# Patient Record
Sex: Male | Born: 1955 | Race: White | Hispanic: No | Smoking: Former smoker
Health system: Southern US, Community
[De-identification: ages and names within clinical notes are randomized; demographics above are authoritative.]

## PROBLEM LIST (undated history)

## (undated) DIAGNOSIS — Z9989 Dependence on other enabling machines and devices: Secondary | ICD-10-CM

## (undated) DIAGNOSIS — M1990 Unspecified osteoarthritis, unspecified site: Secondary | ICD-10-CM

## (undated) DIAGNOSIS — E349 Endocrine disorder, unspecified: Secondary | ICD-10-CM

## (undated) DIAGNOSIS — G4733 Obstructive sleep apnea (adult) (pediatric): Secondary | ICD-10-CM

## (undated) DIAGNOSIS — R030 Elevated blood-pressure reading, without diagnosis of hypertension: Secondary | ICD-10-CM

## (undated) DIAGNOSIS — E785 Hyperlipidemia, unspecified: Secondary | ICD-10-CM

## (undated) DIAGNOSIS — I10 Essential (primary) hypertension: Secondary | ICD-10-CM

## (undated) DIAGNOSIS — E119 Type 2 diabetes mellitus without complications: Secondary | ICD-10-CM

## (undated) HISTORY — DX: Hyperlipidemia, unspecified: E78.5

## (undated) HISTORY — DX: Unspecified osteoarthritis, unspecified site: M19.90

## (undated) HISTORY — DX: Endocrine disorder, unspecified: E34.9

## (undated) HISTORY — DX: Obstructive sleep apnea (adult) (pediatric): G47.33

## (undated) HISTORY — DX: Dependence on other enabling machines and devices: Z99.89

## (undated) HISTORY — PX: FOOT SURGERY: SHX648

## (undated) HISTORY — DX: Type 2 diabetes mellitus without complications: E11.9

## (undated) HISTORY — DX: Elevated blood-pressure reading, without diagnosis of hypertension: R03.0

## (undated) HISTORY — DX: Essential (primary) hypertension: I10

## (undated) HISTORY — DX: Morbid (severe) obesity due to excess calories: E66.01

---

## 2006-12-02 ENCOUNTER — Ambulatory Visit: Payer: Self-pay | Admitting: Family Medicine

## 2007-07-28 ENCOUNTER — Encounter: Admission: RE | Admit: 2007-07-28 | Discharge: 2007-07-28 | Payer: Self-pay | Admitting: Internal Medicine

## 2009-04-08 ENCOUNTER — Ambulatory Visit: Payer: Self-pay

## 2010-10-15 ENCOUNTER — Telehealth: Payer: Self-pay | Admitting: Internal Medicine

## 2010-10-15 NOTE — Telephone Encounter (Signed)
I have faxed him a medical records release form for him to sign and fax back to [place] so we can fax it to [facility].

## 2010-11-25 ENCOUNTER — Telehealth: Payer: Self-pay | Admitting: Internal Medicine

## 2010-11-25 NOTE — Telephone Encounter (Signed)
[phone] Pt called he needs letter stating that he has sleep apena and diabetes high blood pressure for his insurance company.  Pt also stated he filled out medical release form for [facility].   i also called [facility] to see if they can could send [place] his records i spoke with lisa she is going to fax over the problem list to [place].  They didn't have any medical release form.  Patient will come by to fill out form

## 2010-11-26 NOTE — Telephone Encounter (Signed)
Patient came in to fill out medical release form and this was faxed today @ 2:30

## 2010-11-29 NOTE — Telephone Encounter (Signed)
Letter on printer

## 2010-11-29 NOTE — Telephone Encounter (Signed)
Called patient.  Patient aware he will come by to pick up letter

## 2011-11-08 ENCOUNTER — Encounter: Payer: Self-pay | Admitting: Internal Medicine

## 2011-11-08 ENCOUNTER — Ambulatory Visit (INDEPENDENT_AMBULATORY_CARE_PROVIDER_SITE_OTHER): Payer: Self-pay | Admitting: Internal Medicine

## 2011-11-08 VITALS — BP 160/90 | HR 90 | Temp 98.3°F | Ht 74.0 in | Wt 318.0 lb

## 2011-11-08 DIAGNOSIS — E785 Hyperlipidemia, unspecified: Secondary | ICD-10-CM

## 2011-11-08 DIAGNOSIS — E119 Type 2 diabetes mellitus without complications: Secondary | ICD-10-CM

## 2011-11-08 DIAGNOSIS — I10 Essential (primary) hypertension: Secondary | ICD-10-CM | POA: Insufficient documentation
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
## 2011-11-08 NOTE — Patient Instructions (Addendum)
I am starting you on bystolic 5 mg daily for blood pressure  Hold on to the insulin fr now pending lab results    This is  Dr. Norton Blizzard version of a  "Low GI"  Diet:  All of the foods can be found at grocery stores and in bulk at [employer].  The Atkins protein bars and shakes are available in more varieties at Target, WalMart and [employer] Foods.     7 AM Breakfast:  Low carbohydrate Protein  Shakes (I recommend the EAS AdvantEdge "Carb Control" shakes  Or the low carb shakes by Atkins.   Both are available everywhere:  In  cases at BJs  Or in 4 packs at grocery stores and pharmacies  2.5 carbs  (Alternative is  a toasted Arnold's Sandwhich Thin w/ peanut butter, a "Bagel Thin" with cream cheese and salmon) or  a scrambled egg burrito made with a low carb tortilla .  Avoid cereal and bananas, oatmeal too unless you are cooking the old fashioned kind that takes 30-40 minutes to prepare.  the rest is overly processed, has minimal fiber, and is loaded with carbohydrates!   10 AM: Protein bar by Atkins (the snack size, under 200 cal).  There are many varieties , available widely again or in bulk in limited varieties at BJs)  Other so called "protein bars" tend to be loaded with carbohydrates.  Remember, in food advertising, the word "energy" is synonymous for " carbohydrate."  Lunch: sandwich of [place], (or any lunchmeat, grilled meat or canned tuna), fresh avocado, mayonnaise  and cheese on a lower carbohydrate pita bread, flatbread, or tortilla . Ok to use regular mayonnaise. The bread is the only source or carbohydrate that can be decreased (Joseph's makes a pita bread and a flat bread that are 50 cal and 4 net carbs ; Toufayan makes a low carb flatbread that's 100 cal and 9 net carbs  and  Mission makes a low carb whole wheat tortilla  That is 210 cal and 6 net carbs)  3 PM:  Mid day :  Another protein bar,  Or a  cheese stick (100 cal, 0 carbs),  Or 1 ounce of  almonds, walnuts, pistachios, pecans,  peanuts,  Macadamia nuts. Or a Dannon light n Fit greek yogurt, 80 cal 8 net carbs . Avoid "granola"; the dried cranberries and raisins are loaded with carbohydrates. Mixed nuts ok if no raisins or cranberries or dried fruit.      6 PM  Dinner:  "mean and green:"  Meat/chicken/fish or a high protein legume; , with a green salad, and a low GI  Veggie (broccoli, cauliflower, green beans, spinach, brussel sprouts. Lima beans) : Avoid "Low fat dressings, as well as Reyne Dumas and [address]! They are loaded with sugar! Instead use ranch, vinagrette,  Blue cheese, etc  9 PM snack : Breyer's "low carb" fudgsicle or  ice cream bar (Carb Smart line), or  Weight Watcher's ice cream bar , or another "no sugar added" ice cream;a serving of fresh berries/cherries with whipped cream (Avoid bananas, pineapple, grapes  and watermelon on a regular basis because they are high in sugar)   Remember that snack Substitutions should be less than 15 to 20 carbs  Per serving. Remember to subtract fiber grams and sugar alcohols to get the "net carbs."

## 2011-11-09 ENCOUNTER — Encounter: Payer: Self-pay | Admitting: Internal Medicine

## 2011-11-09 DIAGNOSIS — E785 Hyperlipidemia, unspecified: Secondary | ICD-10-CM | POA: Insufficient documentation

## 2011-11-09 DIAGNOSIS — Z9989 Dependence on other enabling machines and devices: Secondary | ICD-10-CM | POA: Insufficient documentation

## 2011-11-09 DIAGNOSIS — E1140 Type 2 diabetes mellitus with diabetic neuropathy, unspecified: Secondary | ICD-10-CM | POA: Insufficient documentation

## 2011-11-09 DIAGNOSIS — G4733 Obstructive sleep apnea (adult) (pediatric): Secondary | ICD-10-CM | POA: Insufficient documentation

## 2011-11-09 DIAGNOSIS — E349 Endocrine disorder, unspecified: Secondary | ICD-10-CM | POA: Insufficient documentation

## 2011-11-09 MED ORDER — NEBIVOLOL HCL 5 MG PO TABS: 5.0000 mg | ORAL_TABLET | Freq: Every day | ORAL | Status: DC

## 2011-11-09 NOTE — Assessment & Plan Note (Signed)
Lipid status unknown. Will not likely treat with a statin until his diabetes is under better control. He has been on a statin in the past with good tolerance.

## 2011-11-09 NOTE — Assessment & Plan Note (Signed)
Previously managed with glipizide and metformin. He has not had medications one year. He will A1c and see that order. This patient was given samples of 70/30 insulin and Levaquin insulin in anticipation that he will need insulin initially to his blood sugars under control before resuming oral medications.  He has a diabetic eye exam set up for this Thursday. He will return in 3 months.

## 2011-11-09 NOTE — Assessment & Plan Note (Signed)
I have addressed  BMI and recommended a low glycemic index diet utilizing smaller more frequent meals to increase metabolism.  I have also recommended that patient start exercising with a goal of 30 minutes of aerobic exercise a minimum of 5 days per week. Screening for lipid disorders, thyroid and diabetes to be done today.   

## 2011-11-09 NOTE — Assessment & Plan Note (Signed)
Currently under control due to lack of medications. Renal status is unknown. Samples of Bystolic were given to him today 5 mg daily.

## 2011-11-10 ENCOUNTER — Other Ambulatory Visit: Payer: Self-pay | Admitting: Internal Medicine

## 2011-11-10 DIAGNOSIS — E119 Type 2 diabetes mellitus without complications: Secondary | ICD-10-CM

## 2011-11-10 DIAGNOSIS — E785 Hyperlipidemia, unspecified: Secondary | ICD-10-CM

## 2011-11-10 LAB — HM DIABETES EYE EXAM

## 2011-11-15 LAB — LIPID PANEL W/O CHOL/HDL RATIO
Cholesterol, Total: 225 mg/dL — ABNORMAL HIGH (ref 100–199)
HDL: 46 mg/dL (ref >39)
LDL Calculated: 130 mg/dL — ABNORMAL HIGH (ref 0–99)
Triglycerides: 244 mg/dL — ABNORMAL HIGH (ref 0–149)
VLDL Cholesterol Cal: 49 mg/dL — ABNORMAL HIGH (ref 5–40)

## 2011-11-15 LAB — COMPREHENSIVE METABOLIC PANEL
Albumin: 4.6 g/dL (ref 3.5–5.5)
BUN/Creatinine Ratio: 15 (ref 9–20)
BUN: 14 mg/dL (ref 6–24)
CO2: 22 mmol/L (ref 19–28)
Chloride: 101 mmol/L (ref 97–108)
Creatinine, Ser: 0.95 mg/dL (ref 0.76–1.27)
Globulin, Total: 2.4 g/dL (ref 1.5–4.5)
Glucose: 233 mg/dL — ABNORMAL HIGH (ref 65–99)
Total Protein: 7 g/dL (ref 6.0–8.5)

## 2011-11-15 LAB — HGB A1C W/O EAG: Hgb A1c MFr Bld: 7.4 % — ABNORMAL HIGH (ref 4.8–5.6)

## 2011-11-20 MED ORDER — PRAVASTATIN SODIUM 20 MG PO TABS: 20.0000 mg | ORAL_TABLET | Freq: Every day | ORAL | Status: DC

## 2011-11-20 MED ORDER — METFORMIN HCL 500 MG PO TABS: 500.0000 mg | ORAL_TABLET | Freq: Two times a day (BID) | ORAL | Status: DC

## 2011-11-20 NOTE — Addendum Note (Signed)
Addended by: Sherlene Shams on: 11/20/2011 09:19 PM   Modules accepted: Orders

## 2011-11-21 ENCOUNTER — Other Ambulatory Visit: Payer: Self-pay

## 2012-02-10 ENCOUNTER — Ambulatory Visit (INDEPENDENT_AMBULATORY_CARE_PROVIDER_SITE_OTHER): Payer: Self-pay | Admitting: Internal Medicine

## 2012-02-10 ENCOUNTER — Encounter: Payer: Self-pay | Admitting: Internal Medicine

## 2012-02-10 VITALS — BP 128/78 | HR 89 | Temp 98.0°F | Resp 16 | Wt 313.0 lb

## 2012-02-10 DIAGNOSIS — Z8673 Personal history of transient ischemic attack (TIA), and cerebral infarction without residual deficits: Secondary | ICD-10-CM

## 2012-02-10 DIAGNOSIS — E119 Type 2 diabetes mellitus without complications: Secondary | ICD-10-CM

## 2012-02-10 DIAGNOSIS — E349 Endocrine disorder, unspecified: Secondary | ICD-10-CM

## 2012-02-10 DIAGNOSIS — IMO0002 Reserved for concepts with insufficient information to code with codable children: Secondary | ICD-10-CM

## 2012-02-10 DIAGNOSIS — G4733 Obstructive sleep apnea (adult) (pediatric): Secondary | ICD-10-CM

## 2012-02-10 DIAGNOSIS — Z9989 Dependence on other enabling machines and devices: Secondary | ICD-10-CM

## 2012-02-10 DIAGNOSIS — E291 Testicular hypofunction: Secondary | ICD-10-CM

## 2012-02-10 DIAGNOSIS — I10 Essential (primary) hypertension: Secondary | ICD-10-CM

## 2012-02-10 DIAGNOSIS — E785 Hyperlipidemia, unspecified: Secondary | ICD-10-CM

## 2012-02-10 DIAGNOSIS — M1710 Unilateral primary osteoarthritis, unspecified knee: Secondary | ICD-10-CM

## 2012-02-10 LAB — LDL CHOLESTEROL, DIRECT: Direct LDL: 100.1 mg/dL

## 2012-02-10 LAB — COMPREHENSIVE METABOLIC PANEL
AST: 31 U/L (ref 0–37)
Albumin: 4.3 g/dL (ref 3.5–5.2)
BUN: 15 mg/dL (ref 6–23)
CO2: 26 mEq/L (ref 19–32)
Calcium: 9.4 mg/dL (ref 8.4–10.5)
Chloride: 100 mEq/L (ref 96–112)
Creatinine, Ser: 0.8 mg/dL (ref 0.4–1.5)
GFR: 102.57 mL/min (ref >60.00)
Glucose, Bld: 231 mg/dL — ABNORMAL HIGH (ref 70–99)
Potassium: 3.7 mEq/L (ref 3.5–5.1)

## 2012-02-10 LAB — LIPID PANEL
Cholesterol: 197 mg/dL (ref 0–200)
Triglycerides: 377 mg/dL — ABNORMAL HIGH (ref 0.0–149.0)

## 2012-02-10 LAB — HEMOGLOBIN A1C: Hgb A1c MFr Bld: 7.5 % — ABNORMAL HIGH (ref 4.6–6.5)

## 2012-02-10 MED ORDER — TRAMADOL HCL 50 MG PO TABS: 50.0000 mg | ORAL_TABLET | Freq: Four times a day (QID) | ORAL | Status: DC | PRN

## 2012-02-10 MED ORDER — MELOXICAM 15 MG PO TABS: 15.0000 mg | ORAL_TABLET | Freq: Every day | ORAL | Status: DC

## 2012-02-10 NOTE — Progress Notes (Signed)
Patient ID: David Pineda, male   DOB: 03/02/1955, 57 y.o.   MRN: [id number]   Patient Active Problem List  Diagnosis  . Hypertension  . OSA on CPAP  . Obesity, morbid, BMI 40.0-49.9  . Diabetes mellitus without complication  . Hyperlipidemia  . Hypotestosteronism  . History of CVA (cerebrovascular accident)  . DJD (degenerative joint disease) of knee    Subjective:  CC:   Chief Complaint  Patient presents with  . Follow-up    HPI:   David Pineda a 57 y.o. male who presents follow up on diabtes mellitus, hypertension, hyperlipidemia, obesity and OSA.   Has had difficulty adhering to a low GI diet due to financial pressures and work schedule. Does not check his blood sugars regularly.  NO recent episodes of hypoglycemia.  Taking meds most of the time.  Having a lot of knee pain . Aggravated by walking and standing.      Past Medical History  Diagnosis Date  . Arthritis   . Elevated blood pressure reading   . OSA on CPAP   . Obesity, morbid, BMI 40.0-49.9   . Diabetes mellitus without complication   . Hypertension   . Hyperlipidemia   . Hypotestosteronism     History reviewed. No pertinent past surgical history.       The following portions of the patient's history were reviewed and updated as appropriate: Allergies, current medications, and problem list.    Review of Systems:   12 Pt  review of systems was negative except those addressed in the HPI,     History   Social History  . Marital Status: Divorced    Spouse Name: N/A    Number of Children: N/A  . Years of Education: N/A   Occupational History  . Not on file.   Social History Main Topics  . Smoking status: Former Games developer  . Smokeless tobacco: Not on file  . Alcohol Use: Yes  . Drug Use: Not on file  . Sexually Active: Not on file   Other Topics Concern  . Not on file   Social History Narrative  . No narrative on file    Objective:  BP 128/78  Pulse 89  Temp 98 F (36.7 C)  (Oral)  Resp 16  Wt 313 lb (141.976 kg)  SpO2 96%  General appearance: alert, cooperative and appears stated age Ears: normal TM's and external ear canals both ears Throat: lips, mucosa, and tongue normal; teeth and gums normal Neck: no adenopathy, no carotid bruit, supple, symmetrical, trachea midline and thyroid not enlarged, symmetric, no tenderness/mass/nodules Back: symmetric, no curvature. ROM normal. No CVA tenderness. Lungs: clear to auscultation bilaterally Heart: regular rate and rhythm, S1, S2 normal, no murmur, click, rub or gallop Abdomen: soft, non-tender; bowel sounds normal; no masses,  no organomegaly Pulses: 2+ and symmetric Skin: Skin color, texture, turgor normal. No rashes or lesions Lymph nodes: Cervical, supraclavicular, and axillary nodes normal.  Assessment and Plan:  Diabetes mellitus without complication a1c is 7.5 on metformin alone.  Will add low dose glipizide reminder for diabetic eye exam and daily aspirin given.  Foot exam normal today.   History of CVA (cerebrovascular accident) An   Hyperlipidemia LDL is 130 and trigs are over 377 on pravstatin. l will raise dose to 40 mg and recommend a low GI diet recommended for 3 month trial given the expense of fenofibrate.   Hypertension Well controlled on current regimen. Renal function stable, no changes today.  OSA on  CPAP diagnosed with prior sleep study, patient tolerating CPAP per report.Marland Kitchen  DJD (degenerative joint disease) of knee Recommend weight loss, quad strengthening exercises and aleve/tramadol for pain control   Updated Medication List Outpatient Encounter Prescriptions as of 02/10/2012  Medication Sig Dispense Refill  . Ascorbic Acid (VITAMIN C) 100 MG tablet Take 100 mg by mouth daily.      Marland Kitchen aspirin 81 MG tablet Take 81 mg by mouth daily.      . fish oil-omega-3 fatty acids 1000 MG capsule Take 2 g by mouth daily.      . metFORMIN (GLUCOPHAGE) 500 MG tablet Take 1 tablet (500 mg  total) by mouth 2 (two) times daily with a meal.  180 tablet  3  . Multiple Vitamins-Minerals (MULTIVITAMIN WITH MINERALS) tablet Take 1 tablet by mouth daily.      . nebivolol (BYSTOLIC) 5 MG tablet Take 1 tablet (5 mg total) by mouth daily.  30 tablet  0  . pravastatin (PRAVACHOL) 40 MG tablet Take 1 tablet (40 mg total) by mouth daily.  90 tablet  3  . [DISCONTINUED] pravastatin (PRAVACHOL) 20 MG tablet Take 1 tablet (20 mg total) by mouth daily.  90 tablet  3  . glipiZIDE (GLUCOTROL) 2.5 mg TABS Take 0.5 tablets (2.5 mg total) by mouth 2 (two) times daily before a meal.  60 tablet  3  . traMADol (ULTRAM) 50 MG tablet Take 1 tablet (50 mg total) by mouth every 6 (six) hours as needed for pain.  120 tablet  4  . [DISCONTINUED] meloxicam (MOBIC) 15 MG tablet Take 1 tablet (15 mg total) by mouth daily.  30 tablet  4     Orders Placed This Encounter  Procedures  . Microalbumin / creatinine urine ratio  . Comprehensive metabolic panel  . Lipid panel  . Hemoglobin A1c  . LDL cholesterol, direct  . HM DIABETES EYE EXAM  . HM DIABETES FOOT EXAM  . HM DIABETES EYE EXAM    No Follow-up on file.

## 2012-02-10 NOTE — Patient Instructions (Addendum)
The tramadol is a pure pain reliever and can be combined with tylenol and aleve  Do not combine aleve with motrin or meloxicam   You can take 2 tramadol every 6 hours if needed (max dose)   You need to lose 10%  Of your current body weight over the next 6 months   This is  my version of a  "Low GI"  Diet:  It is not ultra low carb, but will still lower your blood sugars and allow you to lose 5 to 10 lbs per month if you follow it carefully. All of the foods can be found at grocery stores and in bulk at [employer].  The Atkins protein bars and shakes are available in more varieties at Target, WalMart and [employer] Foods.     7 AM Breakfast:  Low carbohydrate Protein  Shakes (I recommend the EAS AdvantEdge "Carb Control" shakes  Or the low carb shakes by Atkins.   Both are available everywhere:  In  cases at BJs  Or in 4 packs at grocery stores and pharmacies  2.5 carbs  (Alternative is  a toasted Arnold's Sandwhich Thin w/ peanut butter, a "Bagel Thin" with cream cheese and salmon) or  a scrambled egg burrito made with a low carb tortilla .  Avoid cereal and bananas, oatmeal too unless you are cooking the old fashioned kind that takes 30-40 minutes to prepare.  the rest is overly processed, has minimal fiber, and is loaded with carbohydrates!   10 AM: Protein bar by Atkins (the snack size, under 200 cal).  There are many varieties , available widely again or in bulk in limited varieties at BJs)  Other so called "protein bars" tend to be loaded with carbohydrates.  Remember, in food advertising, the word "energy" is synonymous for " carbohydrate."  Lunch: sandwich of [place], (or any lunchmeat, grilled meat or canned tuna), fresh avocado, mayonnaise  and cheese on a lower carbohydrate pita bread, flatbread, or tortilla . Ok to use regular mayonnaise. The bread is the only source or carbohydrate that can be decreased (Joseph's makes a pita bread and a flat bread that are 50 cal and 4 net carbs ; Toufayan  makes a low carb flatbread that's 100 cal and 9 net carbs  and  Mission makes a low carb whole wheat tortilla  That is 210 cal and 6 net carbs)  3 PM:  Mid day :  Another protein bar,  Or a  cheese stick (100 cal, 0 carbs),  Or 1 ounce of  almonds, walnuts, pistachios, pecans, peanuts,  Macadamia nuts. Or a Dannon light n Fit greek yogurt, 80 cal 8 net carbs . Avoid "granola"; the dried cranberries and raisins are loaded with carbohydrates. Mixed nuts ok if no raisins or cranberries or dried fruit.      6 PM  Dinner:  "mean and green:"  Meat/chicken/fish or a high protein legume; , with a green salad, and a low GI  Veggie (broccoli, cauliflower, green beans, spinach, brussel sprouts. Lima beans) : Avoid "Low fat dressings, as well as Reyne Dumas and [address]! They are loaded with sugar! Instead use ranch, vinagrette,  Blue cheese, etc.  There is a low carb pasta by Dreamfield's available at [facility] that is acceptable and tastes great. Try Michel Angel's chicken piccata over low carb pasta. The chicken dish is 0 carbs, and can be found in frozen section at BJs and [employer]. Also try [employer] "Carnitas" (pulled pork, no  sauce,  0 carbs) and his pot roast.   both are in the refrigerated section at BJs   Dreamfield's makes a low carb pasta only 5 g/serving.  Available at all grocery stores,  And tastes like normal pasta  9 PM snack : Breyer's "low carb" fudgsicle or  ice cream bar (Carb Smart line), or  Weight Watcher's ice cream bar , or another "no sugar added" ice cream;a serving of fresh berries/cherries with whipped cream (Avoid bananas, pineapple, grapes  and watermelon on a regular basis because they are high in sugar)   Remember that snack Substitutions should be less than 10 carbs per serving and meals < 20 carbs. Remember to subtract fiber grams and sugar alcohols to get the "net carbs."

## 2012-02-12 ENCOUNTER — Encounter: Payer: Self-pay | Admitting: Internal Medicine

## 2012-02-12 DIAGNOSIS — Z8673 Personal history of transient ischemic attack (TIA), and cerebral infarction without residual deficits: Secondary | ICD-10-CM | POA: Insufficient documentation

## 2012-02-12 DIAGNOSIS — M1710 Unilateral primary osteoarthritis, unspecified knee: Secondary | ICD-10-CM | POA: Insufficient documentation

## 2012-02-12 MED ORDER — PRAVASTATIN SODIUM 40 MG PO TABS: 40.0000 mg | ORAL_TABLET | Freq: Every day | ORAL | Status: DC

## 2012-02-12 MED ORDER — GLIPIZIDE 2.5 MG HALF TABLET: 2.5000 mg | ORAL_TABLET | Freq: Two times a day (BID) | ORAL | Status: DC

## 2012-02-12 NOTE — Assessment & Plan Note (Signed)
Well controlled on current regimen. Renal function stable, no changes today. 

## 2012-02-12 NOTE — Assessment & Plan Note (Signed)
Recommend weight loss, quad strengthening exercises and aleve/tramadol for pain control

## 2012-02-12 NOTE — Assessment & Plan Note (Addendum)
a1c is 7.5 on metformin alone.  Will add low dose glipizide reminder for diabetic eye exam and daily aspirin given.  Foot exam normal today.

## 2012-02-12 NOTE — Assessment & Plan Note (Signed)
diagnosed with prior sleep study, patient tolerating CPAP per report.Marland Kitchen

## 2012-02-12 NOTE — Assessment & Plan Note (Addendum)
LDL is 130 and trigs are over 377 on pravstatin. l will raise dose to 40 mg and recommend a low GI diet recommended for 3 month trial given the expense of fenofibrate.

## 2012-02-12 NOTE — Assessment & Plan Note (Signed)
An

## 2012-02-13 ENCOUNTER — Encounter: Payer: Self-pay | Admitting: Internal Medicine

## 2012-02-14 ENCOUNTER — Telehealth: Payer: Self-pay | Admitting: Internal Medicine

## 2012-02-25 ENCOUNTER — Other Ambulatory Visit: Payer: Self-pay

## 2012-04-19 ENCOUNTER — Encounter: Payer: Self-pay | Admitting: Internal Medicine

## 2012-04-19 DIAGNOSIS — L989 Disorder of the skin and subcutaneous tissue, unspecified: Secondary | ICD-10-CM

## 2012-05-07 ENCOUNTER — Telehealth: Payer: Self-pay | Admitting: *Deleted

## 2012-05-07 NOTE — Telephone Encounter (Signed)
Called patient regarding the e-chart message alert on message sent out on 04-19-12. Patient stated that this has been taken care of and he already has an appointment scheduled with a dermatologist.

## 2012-08-14 ENCOUNTER — Other Ambulatory Visit: Payer: Self-pay | Admitting: *Deleted

## 2012-08-14 DIAGNOSIS — E119 Type 2 diabetes mellitus without complications: Secondary | ICD-10-CM

## 2012-08-14 MED ORDER — GLIPIZIDE 5 MG PO TABS: 2.5000 mg | ORAL_TABLET | Freq: Two times a day (BID) | ORAL | Status: DC

## 2012-09-18 ENCOUNTER — Other Ambulatory Visit: Payer: Self-pay | Admitting: Internal Medicine

## 2012-09-18 NOTE — Telephone Encounter (Signed)
Eprescribed.

## 2012-09-24 ENCOUNTER — Encounter: Payer: Self-pay | Admitting: Internal Medicine

## 2012-10-11 ENCOUNTER — Ambulatory Visit: Payer: Self-pay

## 2012-10-17 ENCOUNTER — Ambulatory Visit (INDEPENDENT_AMBULATORY_CARE_PROVIDER_SITE_OTHER): Payer: BC Managed Care – PPO | Admitting: Adult Health

## 2012-10-17 ENCOUNTER — Ambulatory Visit (INDEPENDENT_AMBULATORY_CARE_PROVIDER_SITE_OTHER): Payer: BC Managed Care – PPO | Admitting: *Deleted

## 2012-10-17 ENCOUNTER — Encounter: Payer: Self-pay | Admitting: Adult Health

## 2012-10-17 VITALS — BP 148/82 | HR 75 | Resp 12 | Wt 308.0 lb

## 2012-10-17 DIAGNOSIS — IMO0002 Reserved for concepts with insufficient information to code with codable children: Secondary | ICD-10-CM

## 2012-10-17 DIAGNOSIS — Z23 Encounter for immunization: Secondary | ICD-10-CM

## 2012-10-17 DIAGNOSIS — M1710 Unilateral primary osteoarthritis, unspecified knee: Secondary | ICD-10-CM

## 2012-10-17 MED ORDER — TRAMADOL HCL 50 MG PO TABS: 50.0000 mg | ORAL_TABLET | Freq: Four times a day (QID) | ORAL | Status: DC | PRN

## 2012-10-17 NOTE — Progress Notes (Signed)
  Subjective:    Patient ID: David Pineda, male    DOB: 12/03/1955, 57 y.o.   MRN: [id number]  HPI  Last seen in January for ongoing problems with knee pain 2/2 DJD. Started on tramadol with good results. Patient is here for refills on history at all. He reports that he only takes this medication when his knees are really bothering him. This does not occur on a daily basis. He reports that he understands he needs to have a knee replacement; however, financially he is not able to do this at this time. Symptoms are aggravated by standing on concrete floor at work.   Current Outpatient Prescriptions on File Prior to Visit  Medication Sig Dispense Refill  . Ascorbic Acid (VITAMIN C) 100 MG tablet Take 100 mg by mouth daily.      Marland Kitchen aspirin 81 MG tablet Take 81 mg by mouth daily.      . fish oil-omega-3 fatty acids 1000 MG capsule Take 2 g by mouth daily.      . metFORMIN (GLUCOPHAGE) 500 MG tablet Take 1 tablet (500 mg total) by mouth 2 (two) times daily with a meal.  180 tablet  3  . Multiple Vitamins-Minerals (MULTIVITAMIN WITH MINERALS) tablet Take 1 tablet by mouth daily.      . nebivolol (BYSTOLIC) 5 MG tablet Take 1 tablet (5 mg total) by mouth daily.  30 tablet  0  . pravastatin (PRAVACHOL) 40 MG tablet Take 1 tablet (40 mg total) by mouth daily.  90 tablet  3   No current facility-administered medications on file prior to visit.                                                                                    Review of Systems  Musculoskeletal: Negative for joint swelling.       Bilateral knee pain       Objective:   Physical Exam  Constitutional: He is oriented to person, place, and time.  Musculoskeletal: Normal range of motion. He exhibits no edema.  Bilateral knee pain 2/2 degenerative joints.  Neurological: He is alert and oriented to person, place, and time.  Skin: Skin is warm and dry.  Psychiatric: He has a normal mood and affect. His behavior is normal. Judgment and  thought content normal.          Assessment & Plan:

## 2012-10-17 NOTE — Progress Notes (Signed)
Patient came in thought he had appointment to see MD was explained he had made appointment through [facility] CHART for nurse visit for TDAP , patient c/o bilateral knee pain advised Dr. Darrick Huntsman had no appointment available so scheduled patient to see NP at 2.30 10/17/12

## 2012-10-17 NOTE — Assessment & Plan Note (Signed)
Continue Aleve/Tramadol for pain in knees as previously prescribed by PCP. Prescription for tramadol given with 4 refills.

## 2012-10-26 ENCOUNTER — Other Ambulatory Visit: Payer: Self-pay | Admitting: Internal Medicine

## 2012-10-26 NOTE — Telephone Encounter (Signed)
appt 11/01/12

## 2012-10-31 ENCOUNTER — Encounter: Payer: Self-pay | Admitting: *Deleted

## 2012-11-01 ENCOUNTER — Encounter: Payer: Self-pay | Admitting: Internal Medicine

## 2012-11-01 ENCOUNTER — Ambulatory Visit (INDEPENDENT_AMBULATORY_CARE_PROVIDER_SITE_OTHER): Payer: BC Managed Care – PPO | Admitting: Internal Medicine

## 2012-11-01 VITALS — BP 130/70 | HR 74 | Temp 98.3°F | Resp 14 | Ht 74.0 in | Wt 307.0 lb

## 2012-11-01 DIAGNOSIS — Z0000 Encounter for general adult medical examination without abnormal findings: Secondary | ICD-10-CM

## 2012-11-01 DIAGNOSIS — E119 Type 2 diabetes mellitus without complications: Secondary | ICD-10-CM

## 2012-11-01 DIAGNOSIS — E785 Hyperlipidemia, unspecified: Secondary | ICD-10-CM

## 2012-11-01 DIAGNOSIS — R079 Chest pain, unspecified: Secondary | ICD-10-CM | POA: Insufficient documentation

## 2012-11-01 DIAGNOSIS — Z1211 Encounter for screening for malignant neoplasm of colon: Secondary | ICD-10-CM

## 2012-11-01 DIAGNOSIS — Z23 Encounter for immunization: Secondary | ICD-10-CM

## 2012-11-01 LAB — COMPREHENSIVE METABOLIC PANEL
AST: 29 U/L (ref 0–37)
BUN: 15 mg/dL (ref 6–23)
Calcium: 9.3 mg/dL (ref 8.4–10.5)
Chloride: 102 mEq/L (ref 96–112)
Creatinine, Ser: 0.8 mg/dL (ref 0.4–1.5)
GFR: 111.55 mL/min (ref >60.00)
Total Bilirubin: 0.7 mg/dL (ref 0.3–1.2)

## 2012-11-01 LAB — HM DIABETES EYE EXAM

## 2012-11-01 LAB — BASIC METABOLIC PANEL
BUN: 15 mg/dL (ref 4–21)
Creatinine: 0.8 mg/dL (ref 0.6–1.3)
Glucose: 185 mg/dL
Potassium: 4.2 mmol/L (ref 3.4–5.3)
Sodium: 139 mmol/L (ref 137–147)

## 2012-11-01 LAB — PSA: PSA: 0.59 ng/mL (ref 0.10–4.00)

## 2012-11-01 LAB — MICROALBUMIN / CREATININE URINE RATIO: Creatinine,U: 224.2 mg/dL

## 2012-11-01 LAB — HEMOGLOBIN A1C: Hgb A1c MFr Bld: 6.9 % — ABNORMAL HIGH (ref 4.6–6.5)

## 2012-11-01 LAB — HEPATIC FUNCTION PANEL
AST: 29 U/L (ref 14–40)
Bilirubin, Total: 0.7 mg/dL

## 2012-11-01 LAB — LIPID PANEL: LDL Cholesterol: 89 mg/dL

## 2012-11-01 MED ORDER — NITROGLYCERIN 0.4 MG SL SUBL: 0.4000 mg | SUBLINGUAL_TABLET | SUBLINGUAL | Status: DC | PRN

## 2012-11-01 NOTE — Progress Notes (Signed)
Patient ID: David Pineda, male   DOB: 1955-07-04, 57 y.o.   MRN: [id number]  The patient is here for his annual male physical examination and management of other chronic and acute problems including diabetes, hyperlipidimeia and obesity. Last seen over 6 months  ago.  Diet reviewed,  He is eating more fruits and vegetables.  Not exercising regularly yet but has plans ot sart riding bikes with his son. Not checking his blood sugars regularly     The risk factors are reflected in the social history.  The roster of all physicians providing medical care to patient - is listed in the Snapshot section of the chart.  Activities of daily living:  The patient is 100% independent in all ADLs: dressing, toileting, feeding as well as independent mobility  Home safety : The patient has smoke detectors in the home. He wears seatbelts.  There are no firearms at home. There is no violence in the home.   There is no risks for hepatitis, STDs or HIV. There is no   history of blood transfusion. There is no travel history to infectious disease endemic areas of the world.  The patient has seen their dentist in the last six month and  their eye doctor in the last year.  They do not  have excessive sun exposure. They have seen a dermatoloigist in the last year. Discussed the need for sun protection: hats, long sleeves and use of sunscreen if there is significant sun exposure.   Diet: the importance of a healthy diet is discussed. They do have a healthy diet.  The benefits of regular aerobic exercise were discussed. He exercises a minimum of 30 minutes  5 days per week. Depression screen: there are no signs or vegative symptoms of depression- irritability, change in appetite, anhedonia, sadness/tearfullness.  The following portions of the patient's history were reviewed and updated as appropriate: allergies, current medications, past family history, past medical history,  past surgical history, past social history   and problem list.  Visual acuity was not assessed per patient preference since he has regular follow up with his ophthalmologist. Hearing and body mass index were assessed and reviewed.   During the course of the visit the patient was educated and counseled about appropriate screening and preventive services including :  nutrition counseling, colorectal cancer screening, and recommended immunizations.    Objective:  BP 130/70  Pulse 74  Temp(Src) 98.3 F (36.8 C) (Oral)  Resp 14  Ht 6\' 2"  (1.88 m)  Wt 307 lb (139.254 kg)  BMI 39.4 kg/m2  SpO2 98%  General Appearance:    Alert, cooperative, no distress, appears stated age  Head:    Normocephalic, without obvious abnormality, atraumatic  Eyes:    PERRL, conjunctiva/corneas clear, EOM's intact, fundi    benign, both eyes       Ears:    Normal TM's and external ear canals, both ears  Nose:   Nares normal, septum midline, mucosa normal, no drainage   or sinus tenderness  Throat:   Lips, mucosa, and tongue normal; teeth and gums normal  Neck:   Supple, symmetrical, trachea midline, no adenopathy;       thyroid:  No enlargement/tenderness/nodules; no carotid   bruit or JVD  Back:     Symmetric, no curvature, ROM normal, no CVA tenderness  Lungs:     Clear to auscultation bilaterally, respirations unlabored  Chest wall:    No tenderness or deformity  Heart:    Regular rate and  rhythm, S1 and S2 normal, no murmur, rub   or gallop  Abdomen:     Soft, non-tender, bowel sounds active all four quadrants,    no masses, no organomegaly  Genitalia:    Normal male without lesion, discharge or tenderness  Rectal:    Normal tone, normal prostate, no masses or tenderness;   guaiac negative stool  Extremities:   Foot exam:  Nails are well trimmed,  No callouses,  Sensation intact to microfilament  Extremities normal, atraumatic, no cyanosis or edema  Pulses:   2+ and symmetric all extremities  Skin:   Skin color, texture, turgor normal, no rashes  or lesions  Lymph nodes:   Cervical, supraclavicular, and axillary nodes normal  Neurologic:   CNII-XII intact. Normal strength, sensation and reflexes      throughout   Assessment and Plan:  Diabetes mellitus without complication Lab Results  Component Value Date   HGBA1C 7.5* 02/10/2012   hemoglobin A1c is elevated at 7.5 he is up-to-date on eye exams and his foot exam is normal.  he has no proteinuria by today's urine test.  Will increase dose of glipizide to 5 mg twice daily   Chest pain, unspecified Patient brings up symptoms of chest pain at the end of a 45 mintue visit.  His history of chest pain is atypical,  but given his multiple risk factors I have aadvised cardiology evaluation.  rx for prn NTG     Hyperlipidemia Lab Results  Component Value Date   CHOL 197 02/10/2012   HDL 38.70* 02/10/2012   LDLCALC 130* 11/10/2011   LDLDIRECT 100.1 02/10/2012   TRIG 377.0* 02/10/2012   CHOLHDL 5 02/10/2012   He is not fasting today so lipids were not done.  Will change statin to high intesnity atorvastatin and  return for fasting labs prior to next visit   Preventative health care Annual male exam was done including testicular and prostate exam. PSA is pending .  Colon ca screening was reviewed and options given.  ;   Updated Medication List Outpatient Encounter Prescriptions as of 11/01/2012  Medication Sig Dispense Refill  . Ascorbic Acid (VITAMIN C) 100 MG tablet Take 100 mg by mouth daily.      Marland Kitchen aspirin 81 MG tablet Take 81 mg by mouth daily.      . fish oil-omega-3 fatty acids 1000 MG capsule Take 2 g by mouth daily.      Marland Kitchen glipiZIDE (GLUCOTROL) 5 MG tablet TAKE ONE TABLET BY MOUTH TWICE DAILY BEFORE MEAL(S)  30 tablet  0  . glucosamine-chondroitin 500-400 MG tablet Take 1 tablet by mouth 3 (three) times daily.      . metFORMIN (GLUCOPHAGE) 500 MG tablet Take 1 tablet (500 mg total) by mouth 2 (two) times daily with a meal.  180 tablet  3  . Multiple Vitamins-Minerals  (MULTIVITAMIN WITH MINERALS) tablet Take 1 tablet by mouth daily.      . nebivolol (BYSTOLIC) 5 MG tablet Take 1 tablet (5 mg total) by mouth daily.  30 tablet  0  . traMADol (ULTRAM) 50 MG tablet Take 1 tablet (50 mg total) by mouth every 6 (six) hours as needed for pain.  120 tablet  4  . [DISCONTINUED] glipiZIDE (GLUCOTROL) 5 MG tablet TAKE ONE-HALF TABLET BY MOUTH TWICE DAILY BEFORE MEAL(S) **CALL  OFFICE  TO  SCHEDULE  AN  APPOINTMENT**  30 tablet  0  . [DISCONTINUED] pravastatin (PRAVACHOL) 40 MG tablet Take 1 tablet (40 mg total)  by mouth daily.  90 tablet  3  . atorvastatin (LIPITOR) 20 MG tablet Take 1 tablet (20 mg total) by mouth daily.  90 tablet  3  . nitroGLYCERIN (NITROSTAT) 0.4 MG SL tablet Place 1 tablet (0.4 mg total) under the tongue every 5 (five) minutes as needed for chest pain.  30 tablet  3  . [DISCONTINUED] glipiZIDE (GLUCOTROL) 5 MG tablet 1 tab bid       No facility-administered encounter medications on file as of 11/01/2012.

## 2012-11-01 NOTE — Patient Instructions (Signed)
You had your annual wellness exam today ALONG with your diabetes follow up   We will schedule your appt with cardiology to evaluate your heart.  Use the nitroglycerin sublingual tablet (under the tongue) for the next episode.  Please use the stool kit to send [place] back a sample to test for blood.  This is your colon CA screening test.   You received the pneumonia and influenza vaccines today.  We will contact you with the bloodwork results using MyChart

## 2012-11-02 DIAGNOSIS — Z0000 Encounter for general adult medical examination without abnormal findings: Secondary | ICD-10-CM | POA: Insufficient documentation

## 2012-11-02 MED ORDER — GLIPIZIDE 5 MG PO TABS: ORAL_TABLET | ORAL | Status: DC

## 2012-11-02 MED ORDER — ATORVASTATIN CALCIUM 20 MG PO TABS: 20.0000 mg | ORAL_TABLET | Freq: Every day | ORAL | Status: DC

## 2012-11-02 NOTE — Assessment & Plan Note (Addendum)
Lab Results  Component Value Date   HGBA1C 7.5* 02/10/2012   hemoglobin A1c is elevated at 7.5 he is up-to-date on eye exams and his foot exam is normal.  he has no proteinuria by today's urine test.  Will increase dose of glipizide to 5 mg twice daily

## 2012-11-02 NOTE — Assessment & Plan Note (Signed)
Lab Results  Component Value Date   CHOL 197 02/10/2012   HDL 38.70* 02/10/2012   LDLCALC 130* 11/10/2011   LDLDIRECT 100.1 02/10/2012   TRIG 377.0* 02/10/2012   CHOLHDL 5 02/10/2012   He is not fasting today so lipids were not done.  Will change statin to high intesnity atorvastatin and  return for fasting labs prior to next visit

## 2012-11-02 NOTE — Assessment & Plan Note (Signed)
Annual male exam was done including testicular and prostate exam. PSA is pending .  Colon ca screening was reviewed and options given.   

## 2012-11-02 NOTE — Assessment & Plan Note (Signed)
Patient brings up symptoms of chest pain at the end of a 45 mintue visit.  His history of chest pain is atypical,  but given his multiple risk factors I have aadvised cardiology evaluation.  rx for prn NTG

## 2012-11-08 ENCOUNTER — Ambulatory Visit: Payer: BC Managed Care – PPO | Admitting: Cardiovascular Disease

## 2012-11-16 ENCOUNTER — Ambulatory Visit (INDEPENDENT_AMBULATORY_CARE_PROVIDER_SITE_OTHER): Payer: BC Managed Care – PPO | Admitting: Cardiovascular Disease

## 2012-11-16 ENCOUNTER — Encounter: Payer: Self-pay | Admitting: Cardiovascular Disease

## 2012-11-16 VITALS — BP 180/100 | HR 83 | Ht 74.0 in | Wt 308.0 lb

## 2012-11-16 DIAGNOSIS — I10 Essential (primary) hypertension: Secondary | ICD-10-CM

## 2012-11-16 DIAGNOSIS — G4733 Obstructive sleep apnea (adult) (pediatric): Secondary | ICD-10-CM

## 2012-11-16 DIAGNOSIS — E349 Endocrine disorder, unspecified: Secondary | ICD-10-CM

## 2012-11-16 DIAGNOSIS — E119 Type 2 diabetes mellitus without complications: Secondary | ICD-10-CM

## 2012-11-16 DIAGNOSIS — E291 Testicular hypofunction: Secondary | ICD-10-CM

## 2012-11-16 DIAGNOSIS — Z9989 Dependence on other enabling machines and devices: Secondary | ICD-10-CM

## 2012-11-16 DIAGNOSIS — E785 Hyperlipidemia, unspecified: Secondary | ICD-10-CM

## 2012-11-16 DIAGNOSIS — R079 Chest pain, unspecified: Secondary | ICD-10-CM

## 2012-11-16 MED ORDER — AMLODIPINE BESYLATE 10 MG PO TABS: 10.0000 mg | ORAL_TABLET | Freq: Every day | ORAL | Status: DC

## 2012-11-16 NOTE — Assessment & Plan Note (Signed)
We had a long discussion about his chest pain. It does sound atypical, likely musculoskeletal. It has been ongoing for several years, not associated with exertion. He would like to hold off on treadmill testing at this time. I feel that this is okay but have stressed that he call us if symptoms start to come on with exertion. At that time we would order a stress test.

## 2012-11-16 NOTE — Assessment & Plan Note (Signed)
He will discuss going back on testosterone supplement with Dr. Darrick Huntsman. He wonders if this might make him feel more energetic and help with weight loss

## 2012-11-16 NOTE — Assessment & Plan Note (Signed)
We have encouraged continued exercise, careful diet management in an effort to lose weight. 

## 2012-11-16 NOTE — Assessment & Plan Note (Signed)
We spent significant time talking about his blood pressure today. He did not appear anxious and systolic pressures were 180, even on recheck. We have prescribed amlodipine 10 mg daily. We have also suggested she buy a automatic blood pressure cuff and record numbers , and MYchart myself or Dr. Darrick Huntsman for further medication adjustment

## 2012-11-16 NOTE — Assessment & Plan Note (Signed)
Given he is high risk for CAD with underlying diabetes, unknown family history, would be aggressive with cholesterol. Would have at least LDL less than 100, if not close to 70 New lab work pending.

## 2012-11-16 NOTE — Patient Instructions (Addendum)
Please start amlodipine one a day for high blood pressure (>140 on the top number)  Please call us if you have new issues that need to be addressed before your next appt.  Your physician wants you to follow-up in: 6 months.  You will receive a reminder letter in the mail two months in advance. If you don't receive a letter, please call our office to schedule the follow-up appointment.

## 2012-11-16 NOTE — Progress Notes (Signed)
Patient ID: David Pineda, male    DOB: 02-27-1955, 57 y.o.   MRN: [id number]  HPI Comments: David Pineda is a pleasant 57 year old gentleman with history of low testosterone, diabetes, hypertension, obesity, hyperlipidemia who presents for evaluation of left-sided chest pain.  He reports that he has had pain for many years. Previously evaluated he believes in [place] by a cardiologist with a Myoview and echocardiogram for similar symptoms. His chest pain seems to stutter, last one or 2 minutes, notable under his left arm around the pectoral region, described as a sharp pain. Symptoms come on at rest, never typically with exertion. He has been doing some bike riding with his 60-year-old son with no reproducible symptoms apart from shortness of breath with hills. He does report having to stop if he bikes to fast as he is winded and " out of shape". He's not had any symptoms in the past 3 weeks.  He reports that he is trying to do better with his diet, making shakes. He is having a lot of stress as he is going through a divorce.  Recent lab work showed total cholesterol 197. He was changed from simvastatin to Lipitor. No recent lipid panel. He does not check his blood pressure at home. He does have a manual cuff  EKG shows normal sinus rhythm with rate 83 beats per minute, no significant ST or T wave changes. He does not know his family history as he was adopted He does report a remote smoking history, stopped at age 16 Hemoglobin A1c 7.5   Outpatient Encounter Prescriptions as of 11/16/2012  Medication Sig  . Ascorbic Acid (VITAMIN C) 100 MG tablet Take 100 mg by mouth daily.  Marland Kitchen aspirin 81 MG tablet Take 81 mg by mouth daily.  Marland Kitchen atorvastatin (LIPITOR) 20 MG tablet Take 1 tablet (20 mg total) by mouth daily.  . fish oil-omega-3 fatty acids 1000 MG capsule Take 2 g by mouth daily.  Marland Kitchen glipiZIDE (GLUCOTROL) 5 MG tablet TAKE ONE TABLET BY MOUTH TWICE DAILY BEFORE MEAL(S)  . glucosamine-chondroitin  500-400 MG tablet Take 1 tablet by mouth 3 (three) times daily.  . metFORMIN (GLUCOPHAGE) 500 MG tablet Take 1 tablet (500 mg total) by mouth 2 (two) times daily with a meal.  . Multiple Vitamins-Minerals (MULTIVITAMIN WITH MINERALS) tablet Take 1 tablet by mouth daily.  . nebivolol (BYSTOLIC) 5 MG tablet Take 1 tablet (5 mg total) by mouth daily.  . nitroGLYCERIN (NITROSTAT) 0.4 MG SL tablet Place 1 tablet (0.4 mg total) under the tongue every 5 (five) minutes as needed for chest pain.  . traMADol (ULTRAM) 50 MG tablet Take 1 tablet (50 mg total) by mouth every 6 (six) hours as needed for pain.     Review of Systems  Constitutional: Negative.   HENT: Negative.   Eyes: Negative.   Respiratory: Positive for chest tightness.   Cardiovascular: Negative.   Gastrointestinal: Negative.   Endocrine: Negative.   Musculoskeletal: Negative.   Skin: Negative.   Allergic/Immunologic: Negative.   Neurological: Negative.   Hematological: Negative.   Psychiatric/Behavioral: Negative.   All other systems reviewed and are negative.    BP 180/100  Pulse 83  Ht 6\' 2"  (1.88 m)  Wt 308 lb (139.708 kg)  BMI 39.53 kg/m2  Physical Exam  Nursing note and vitals reviewed. Constitutional: He is oriented to person, place, and time. He appears well-developed and well-nourished.  HENT:  Head: Normocephalic.  Nose: Nose normal.  Mouth/Throat: Oropharynx is clear and moist.  Eyes: Conjunctivae are normal. Pupils are equal, round, and reactive to light.  Neck: Normal range of motion. Neck supple. No JVD present.  Cardiovascular: Normal rate, regular rhythm, S1 normal, S2 normal, normal heart sounds and intact distal pulses.  Exam reveals no gallop and no friction rub.   No murmur heard. Pulmonary/Chest: Effort normal and breath sounds normal. No respiratory distress. He has no wheezes. He has no rales. He exhibits no tenderness.  Abdominal: Soft. Bowel sounds are normal. He exhibits no distension. There  is no tenderness.  Musculoskeletal: Normal range of motion. He exhibits no edema and no tenderness.  Lymphadenopathy:    He has no cervical adenopathy.  Neurological: He is alert and oriented to person, place, and time. Coordination normal.  Skin: Skin is warm and dry. No rash noted. No erythema.  Psychiatric: He has a normal mood and affect. His behavior is normal. Judgment and thought content normal.      Assessment and Plan

## 2012-11-23 ENCOUNTER — Encounter: Payer: Self-pay | Admitting: Internal Medicine

## 2012-11-23 ENCOUNTER — Other Ambulatory Visit: Payer: Self-pay | Admitting: Internal Medicine

## 2012-11-23 DIAGNOSIS — N529 Male erectile dysfunction, unspecified: Secondary | ICD-10-CM | POA: Insufficient documentation

## 2012-11-23 MED ORDER — SILDENAFIL CITRATE 100 MG PO TABS: 100.0000 mg | ORAL_TABLET | Freq: Every day | ORAL | Status: DC | PRN

## 2012-11-26 NOTE — Telephone Encounter (Signed)
I had Eber Jones call for the labs since they did not result in Epic. I placed them in your yellow folder.

## 2012-11-26 NOTE — Telephone Encounter (Signed)
Eber Jones to check on status of labs drawn 11/01/12

## 2012-11-27 ENCOUNTER — Telehealth: Payer: Self-pay | Admitting: Internal Medicine

## 2012-11-27 DIAGNOSIS — E291 Testicular hypofunction: Secondary | ICD-10-CM

## 2012-11-27 DIAGNOSIS — Z125 Encounter for screening for malignant neoplasm of prostate: Secondary | ICD-10-CM | POA: Insufficient documentation

## 2012-11-27 DIAGNOSIS — E119 Type 2 diabetes mellitus without complications: Secondary | ICD-10-CM

## 2012-11-29 NOTE — Telephone Encounter (Signed)
Mailed unread message to pt, requested call or myChart message on what he would like to do.

## 2012-12-25 ENCOUNTER — Ambulatory Visit (INDEPENDENT_AMBULATORY_CARE_PROVIDER_SITE_OTHER): Payer: BC Managed Care – PPO | Admitting: Internal Medicine

## 2012-12-25 ENCOUNTER — Telehealth: Payer: Self-pay | Admitting: Internal Medicine

## 2012-12-25 VITALS — BP 126/74 | HR 114 | Temp 98.2°F | Resp 12 | Wt 306.0 lb

## 2012-12-25 DIAGNOSIS — J069 Acute upper respiratory infection, unspecified: Secondary | ICD-10-CM

## 2012-12-25 MED ORDER — PREDNISONE (PAK) 10 MG PO TABS: ORAL_TABLET | ORAL | Status: DC

## 2012-12-25 MED ORDER — AMOXICILLIN 500 MG PO CAPS: 500.0000 mg | ORAL_CAPSULE | Freq: Three times a day (TID) | ORAL | Status: DC

## 2012-12-25 NOTE — Progress Notes (Signed)
Pre visit review using our clinic review tool, if applicable. No additional management support is needed unless otherwise documented below in the visit note. 

## 2012-12-25 NOTE — Telephone Encounter (Signed)
Pt being seen in office now by Dr. Darrick Huntsman

## 2012-12-25 NOTE — Telephone Encounter (Signed)
Your symptoms suggest that You have a viral  Syndrome, which can cause low grade fevers (100),  Sore throat and sinus congestion and cough .  The post nasal drip is causing your sore throat.  Lavage your sinuses twice daily with Simply saline nasal spray.  Use benadryl 25 mg every 8 hours and Sudafed PE 10 to 30 every 8 hours to manage the drainage and congestion.  Gargle with salt water often for the sore throat.  Delsym for the cough   If the throat is no better  In 3 to 4 days OR  if you develop T > 100.4,  Green or bloody  nasal discharge,  Ear Or facial pain,  Call for an antibiotic.

## 2012-12-25 NOTE — Telephone Encounter (Signed)
430 today

## 2012-12-25 NOTE — Progress Notes (Signed)
Patient ID: David Pineda, male   DOB: 03/22/1955, 57 y.o.   MRN: [id number]  Patient Active Problem List   Diagnosis Date Noted  . Acute URI 12/28/2012  . Special screening for malignant neoplasm of prostate 11/27/2012  . 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 Preventative health care 11/02/2012  . Chest pain, unspecified 11/01/2012  . History of CVA (cerebrovascular accident) 02/12/2012  . DJD (degenerative joint disease) of knee 02/12/2012  . OSA on CPAP   . Obesity, morbid, BMI 40.0-49.9   . Diabetes mellitus without complication   . Hyperlipidemia   . Hypotestosteronism   . Hypertension 11/08/2011    Subjective:  CC:   Chief Complaint  Patient presents with  . Cough    HPI:   David Hortonis a 57 y.o. male who presents for acute visit for symptoms of  URI.  No fevers, nausea.  No recent travel.  Sunday night started  feeling bad,  Cough some throat.  productive cough ,  Headache top of the head,  Sinus not a problem    Past Medical History  Diagnosis Date  . Arthritis   . Elevated blood pressure reading   . OSA on CPAP   . Obesity, morbid, BMI 40.0-49.9   . Diabetes mellitus without complication   . Hypertension   . Hyperlipidemia   . Hypotestosteronism     Past Surgical History  Procedure Laterality Date  . Foot surgery      left        The following portions of the patient's history were reviewed and updated as appropriate: Allergies, current medications, and problem list.    Review of Systems:   12 Pt  review of systems was negative except those addressed in the HPI,     History   Social History  . Marital Status: Divorced    Spouse Name: N/A    Number of Children: N/A  . Years of Education: N/A   Occupational History  . Not on file.   Social History Main Topics  . Smoking status: Former Smoker -- 3.00 packs/day for 7 years    Types: Cigarettes    Quit date: 11/17/1994  . Smokeless tobacco: Not on file  . Alcohol  Use: Yes  . Drug Use: No  . Sexual Activity: Not on file   Other Topics Concern  . Not on file   Social History Narrative  . No narrative on file    Objective:  Filed Vitals:   12/25/12 1641  BP: 126/74  Pulse: 114  Temp: 98.2 F (36.8 C)  Resp: 12     General appearance: alert, cooperative and appears stated age Ears: normal TM's and external ear canals both ears Throat: lips, mucosa, and tongue normal; teeth and gums normal Neck: no adenopathy, no carotid bruit, supple, symmetrical, trachea midline and thyroid not enlarged, symmetric, no tenderness/mass/nodules Back: symmetric, no curvature. ROM normal. No CVA tenderness. Lungs: clear to auscultation bilaterally Heart: regular rate and rhythm, S1, S2 normal, no murmur, click, rub or gallop Abdomen: soft, non-tender; bowel sounds normal; no masses,  no organomegaly Pulses: 2+ and symmetric Skin: Skin color, texture, turgor normal. No rashes or lesions Lymph nodes: Cervical, supraclavicular, and axillary nodes normal.  Assessment and Plan:  Acute URI Symptoms of  URI are caused by viral infection currently given her current symptoms.   I have explained that in viral URIS, an antibiotic will not help the symptoms and will increase the risk of developing diarrhea. Advised  to use oral and nasal decongestants,  Ibuprofen 400 mg and tylenol 650 mq 8 hrs for aches and pains,  tessalon every 8 hours prn cough  Advised to start round of abx only if symptoms worsen to include fevers, facial pain, purulent sputum./drainage.    Updated Medication List Outpatient Encounter Prescriptions as of 12/25/2012  Medication Sig  . amLODipine (NORVASC) 10 MG tablet Take 1 tablet (10 mg total) by mouth daily.  Marland Kitchen amoxicillin (AMOXIL) 500 MG capsule Take 1 capsule (500 mg total) by mouth 3 (three) times daily.  . Ascorbic Acid (VITAMIN C) 100 MG tablet Take 100 mg by mouth daily.  Marland Kitchen aspirin 81 MG tablet Take 81 mg by mouth daily.  Marland Kitchen  atorvastatin (LIPITOR) 20 MG tablet Take 1 tablet (20 mg total) by mouth daily.  . fish oil-omega-3 fatty acids 1000 MG capsule Take 2 g by mouth daily.  Marland Kitchen glipiZIDE (GLUCOTROL) 5 MG tablet TAKE ONE TABLET BY MOUTH TWICE DAILY BEFORE MEAL(S)  . glucosamine-chondroitin 500-400 MG tablet Take 1 tablet by mouth 3 (three) times daily.  . metFORMIN (GLUCOPHAGE) 500 MG tablet Take 1 tablet (500 mg total) by mouth 2 (two) times daily with a meal.  . Multiple Vitamins-Minerals (MULTIVITAMIN WITH MINERALS) tablet Take 1 tablet by mouth daily.  . nebivolol (BYSTOLIC) 5 MG tablet Take 1 tablet (5 mg total) by mouth daily.  . nitroGLYCERIN (NITROSTAT) 0.4 MG SL tablet Place 1 tablet (0.4 mg total) under the tongue every 5 (five) minutes as needed for chest pain.  . predniSONE (STERAPRED UNI-PAK) 10 MG tablet 6 tablets on Day 1 , then reduce by 1 tablet daily until gone  . sildenafil (VIAGRA) 100 MG tablet Take 1 tablet (100 mg total) by mouth daily as needed for erectile dysfunction.  . traMADol (ULTRAM) 50 MG tablet Take 1 tablet (50 mg total) by mouth every 6 (six) hours as needed for pain.     No orders of the defined types were placed in this encounter.    No Follow-up on file.

## 2012-12-25 NOTE — Patient Instructions (Addendum)
You have a viral  Syndrome .  The post nasal drip is causing your sore throat.  Lavage your sinuses twice daily with Simply saline nasal spray.  Use benadryl 25 mg every 8 hours and Sudafed PE 10 to 30 mg every 8 hours if needed for  drainage and congestion.  Gargle with salt water as needed for the sore throat.  Prednisone taper for the cough and inflammation  If the throat is no better  In 3 to 4 days OR  if you develop T > 100.4,  Green nasal discharge,  Or facial pain,  Start the antibiotic Please take a probiotic ( Align, Floraque or Culturelle) while you are on the antibiotic to prevent a serious antibiotic associated diarrhea  Called clostirudium dificile colitis

## 2012-12-25 NOTE — Telephone Encounter (Signed)
Fever 100 ,cough ,sore throat hurts when he swallows and coughs,and phlegm was brown now it is clear.

## 2012-12-25 NOTE — Telephone Encounter (Signed)
Pt notified. appt scheduled.

## 2012-12-25 NOTE — Telephone Encounter (Signed)
Patient Information:  Caller Name: Perez  Phone: [phone]  Patient: David Pineda, David Pineda  Gender: Male  DOB: 1955/08/07  Age: 56 Years  PCP: Duncan Dull (Adults only)  Office Follow Up:  Does the office need to follow up with this patient?: Yes  Instructions For The Office: Pt asking to fit in today or tomorrow if possible   Symptoms  Reason For Call & Symptoms: Pt had called for an appt today /the office is full. Pt has congestion and fever. Onset yesterday. Pt is coughing up sputum and has a headache.  Reviewed Health History In EMR: Yes  Reviewed Medications In EMR: Yes  Reviewed Allergies In EMR: Yes  Reviewed Surgeries / Procedures: Yes  Date of Onset of Symptoms: 12/24/2012  Treatments Tried: advil cold and sinus; throat lozenges; Vit c  Treatments Tried Worked: No  Any Fever: Yes  Fever Taken: Oral  Fever Time Of Reading: 10:00:00  Fever Last Reading: 100  Guideline(s) Used:  Colds  Disposition Per Guideline:   See Today or Tomorrow in Office  Reason For Disposition Reached:   Patient wants to be seen  Advice Given:  Treatment for Associated Symptoms of Colds:  For muscle aches, headaches, or moderate fever (more than 101 F or 38.9 C): Take acetaminophen every 4 hours.  Sore throat: Try throat lozenges, hard candy, or warm chicken broth.  Cough: Use cough drops.  Hydrate: Drink adequate liquids.  Humidifier:  If the air in your home is dry, use a cool-mist humidifier  Patient Will Follow Care Advice:  YES

## 2012-12-27 ENCOUNTER — Telehealth: Payer: Self-pay | Admitting: Internal Medicine

## 2012-12-27 NOTE — Telephone Encounter (Signed)
Pt was seen 12/16.  States he has taken rx given at visit but is feeling worse today.  States he was given a second prescription to fill if he was not feeling better with the first prescription.  Today states he is feeling worse so is asking if Dr. Darrick Huntsman wants him to fill the second prescription.Marland Kitchen

## 2012-12-27 NOTE — Telephone Encounter (Signed)
Pt advised per dr. Melina Schools last note to take Align or Probiotic or Cultrelle (probiotic's)

## 2012-12-28 ENCOUNTER — Emergency Department: Payer: Self-pay | Admitting: Emergency Medicine

## 2012-12-28 ENCOUNTER — Encounter: Payer: Self-pay | Admitting: Internal Medicine

## 2012-12-28 DIAGNOSIS — J069 Acute upper respiratory infection, unspecified: Secondary | ICD-10-CM | POA: Insufficient documentation

## 2012-12-28 NOTE — Assessment & Plan Note (Signed)
Symptoms of  URI are caused by viral infection currently given her current symptoms.   I have explained that in viral URIS, an antibiotic will not help the symptoms and will increase the risk of developing diarrhea. Advised to use oral and nasal decongestants,  Ibuprofen 400 mg and tylenol 650 mq 8 hrs for aches and pains,  tessalon every 8 hours prn cough  Advised to start round of abx only if symptoms worsen to include fevers, facial pain, purulent sputum./drainage.  

## 2013-01-02 ENCOUNTER — Encounter: Payer: Self-pay | Admitting: Adult Health

## 2013-01-02 ENCOUNTER — Ambulatory Visit (INDEPENDENT_AMBULATORY_CARE_PROVIDER_SITE_OTHER): Payer: BC Managed Care – PPO | Admitting: Adult Health

## 2013-01-02 VITALS — BP 140/70 | HR 96 | Temp 98.1°F | Resp 16 | Wt 307.0 lb

## 2013-01-02 DIAGNOSIS — E119 Type 2 diabetes mellitus without complications: Secondary | ICD-10-CM

## 2013-01-02 DIAGNOSIS — J069 Acute upper respiratory infection, unspecified: Secondary | ICD-10-CM

## 2013-01-02 MED ORDER — METFORMIN HCL 500 MG PO TABS: 500.0000 mg | ORAL_TABLET | Freq: Two times a day (BID) | ORAL | Status: DC

## 2013-01-02 MED ORDER — AZITHROMYCIN 250 MG PO TABS: ORAL_TABLET | ORAL | Status: DC

## 2013-01-02 MED ORDER — BROMPHENIRAMINE-PSEUDOEPH 1-15 MG/5ML PO ELIX: 5.0000 mL | ORAL_SOLUTION | Freq: Four times a day (QID) | ORAL | Status: DC | PRN

## 2013-01-02 MED ORDER — GLIPIZIDE 5 MG PO TABS: ORAL_TABLET | ORAL | Status: DC

## 2013-01-02 NOTE — Patient Instructions (Signed)
  Start Azithromycin 2 tablets today then 1 tablet daily for the next 4 days  Take Dimetapp Elixir to help with congestion.  Drink plenty of fluids to stay hydrated.  If no improvement within 1 week then we will refer to ENT.

## 2013-01-02 NOTE — Progress Notes (Signed)
Pre visit review using our clinic review tool, if applicable. No additional management support is needed unless otherwise documented below in the visit note. 

## 2013-01-02 NOTE — Progress Notes (Signed)
   Subjective:    Patient ID: David Pineda, male    DOB: 1955-07-30, 57 y.o.   MRN: [id number]  HPI  Recently completed amoxicillin (last night). Coughing ongoing. Severe at times. Reports blood tinge to sputum after coughing severely. He has been taking Delsym at bedtime which helps. Reports still having congestion within ears. Feels sounds muffled. Has had improvement since completing antibiotic.   Current Outpatient Prescriptions on File Prior to Visit  Medication Sig Dispense Refill  . amLODipine (NORVASC) 10 MG tablet Take 1 tablet (10 mg total) by mouth daily.  90 tablet  3  . amoxicillin (AMOXIL) 500 MG capsule Take 1 capsule (500 mg total) by mouth 3 (three) times daily.  21 capsule  0  . Ascorbic Acid (VITAMIN C) 100 MG tablet Take 100 mg by mouth daily.      Marland Kitchen aspirin 81 MG tablet Take 81 mg by mouth daily.      Marland Kitchen atorvastatin (LIPITOR) 20 MG tablet Take 1 tablet (20 mg total) by mouth daily.  90 tablet  3  . fish oil-omega-3 fatty acids 1000 MG capsule Take 2 g by mouth daily.      Marland Kitchen glucosamine-chondroitin 500-400 MG tablet Take 1 tablet by mouth 3 (three) times daily.      . Multiple Vitamins-Minerals (MULTIVITAMIN WITH MINERALS) tablet Take 1 tablet by mouth daily.      . nebivolol (BYSTOLIC) 5 MG tablet Take 1 tablet (5 mg total) by mouth daily.  30 tablet  0  . nitroGLYCERIN (NITROSTAT) 0.4 MG SL tablet Place 1 tablet (0.4 mg total) under the tongue every 5 (five) minutes as needed for chest pain.  30 tablet  3  . predniSONE (STERAPRED UNI-PAK) 10 MG tablet 6 tablets on Day 1 , then reduce by 1 tablet daily until gone  21 tablet  0  . sildenafil (VIAGRA) 100 MG tablet Take 1 tablet (100 mg total) by mouth daily as needed for erectile dysfunction.  10 tablet  3  . traMADol (ULTRAM) 50 MG tablet Take 1 tablet (50 mg total) by mouth every 6 (six) hours as needed for pain.  120 tablet  4   No current facility-administered medications on file prior to visit.     Review of  Systems  Constitutional: Negative for fever and chills.  HENT: Positive for congestion. Negative for ear discharge, ear pain and sinus pressure.   Respiratory: Positive for cough. Negative for shortness of breath and wheezing.   Cardiovascular: Negative.        Objective:   Physical Exam  Constitutional: He is oriented to person, place, and time. No distress.  HENT:  Head: Normocephalic and atraumatic.  Bilateral ear canal erythematous. TM bulging on the right.   Cardiovascular: Normal rate and regular rhythm.   Pulmonary/Chest: Effort normal and breath sounds normal. No respiratory distress. He has no wheezes. He has no rales.  Neurological: He is alert and oriented to person, place, and time.  Psychiatric: He has a normal mood and affect. His behavior is normal. Judgment and thought content normal.          Assessment & Plan:

## 2013-01-02 NOTE — Assessment & Plan Note (Signed)
Improved but coughing at times severe. Start Azithromycin. Dimetapp elixir x 1 week to see if improvement with ear congestion. If no improvement refer to ENT

## 2013-01-07 ENCOUNTER — Telehealth: Payer: Self-pay | Admitting: Internal Medicine

## 2013-01-07 NOTE — Telephone Encounter (Signed)
Patient Information:  Caller Name: L[place]  Phone: [phone]  Patient: David Pineda, David Pineda  Gender: Male  DOB: January 14, 1955  Age: 57 Years  PCP: Duncan Dull (Adults only)  Office Follow Up:  Does the office need to follow up with this patient?: No  Instructions For The Office: N/A   Symptoms  Reason For Call & Symptoms: Pt is calling and states that he has been in the office 2-3 times for cough and flu like sx; Amoxicllin course was completed; Zpak was given on 01/02/13 and completed this as well; pt is still coughing up intermittent green to white mucus and right ear is still hurting and can not hear from the right ear; pt is requesting another round of antibiotic or possible ENT referral;  ear congestion seems like the worst sx at this time  Reviewed Health History In EMR: Yes  Reviewed Medications In EMR: Yes  Reviewed Allergies In EMR: Yes  Reviewed Surgeries / Procedures: Yes  Date of Onset of Symptoms: 12/31/2012  Guideline(s) Used:  Ear - Congestion  Disposition Per Guideline:   See Within 3 Days in Office  Reason For Disposition Reached:   Ear congestion present > 48 hours  Advice Given:  Treatment - Chewing and Swallowing:   Try chewing gum.  You can also try swallowing water while pinching your nostrils closed. The reason this works is that it creates a small vacuum in the nose. This helps the eustachian tube to open up.  Call Back If:   Ear pain or fever occurs  You become worse.  Patient Will Follow Care Advice:  YES  Appt made for 11:15am on  01/08/13 with Dr Milinda Antis at [facility] location per pt request

## 2013-01-07 NOTE — Telephone Encounter (Signed)
error 

## 2013-01-08 ENCOUNTER — Ambulatory Visit: Payer: Self-pay | Admitting: Family Medicine

## 2013-01-13 ENCOUNTER — Encounter: Payer: Self-pay | Admitting: Internal Medicine

## 2013-01-15 MED ORDER — PREDNISONE (PAK) 10 MG PO TABS: ORAL_TABLET | ORAL | Status: DC

## 2013-02-07 ENCOUNTER — Encounter: Payer: Self-pay | Admitting: Internal Medicine

## 2013-02-07 ENCOUNTER — Ambulatory Visit (INDEPENDENT_AMBULATORY_CARE_PROVIDER_SITE_OTHER): Payer: BC Managed Care – PPO | Admitting: Internal Medicine

## 2013-02-07 VITALS — BP 142/74 | HR 86 | Temp 98.6°F | Resp 12 | Ht 74.0 in | Wt 302.5 lb

## 2013-02-07 DIAGNOSIS — G4733 Obstructive sleep apnea (adult) (pediatric): Secondary | ICD-10-CM

## 2013-02-07 DIAGNOSIS — E291 Testicular hypofunction: Secondary | ICD-10-CM

## 2013-02-07 DIAGNOSIS — E119 Type 2 diabetes mellitus without complications: Secondary | ICD-10-CM

## 2013-02-07 DIAGNOSIS — E785 Hyperlipidemia, unspecified: Secondary | ICD-10-CM

## 2013-02-07 DIAGNOSIS — E349 Endocrine disorder, unspecified: Secondary | ICD-10-CM

## 2013-02-07 DIAGNOSIS — J069 Acute upper respiratory infection, unspecified: Secondary | ICD-10-CM

## 2013-02-07 DIAGNOSIS — Z9989 Dependence on other enabling machines and devices: Secondary | ICD-10-CM

## 2013-02-07 DIAGNOSIS — I10 Essential (primary) hypertension: Secondary | ICD-10-CM

## 2013-02-07 MED ORDER — PREDNISONE (PAK) 10 MG PO TABS: ORAL_TABLET | ORAL | Status: DC

## 2013-02-07 MED ORDER — AMOXICILLIN-POT CLAVULANATE 875-125 MG PO TABS
1.0000 | ORAL_TABLET | Freq: Two times a day (BID) | ORAL | Status: DC
Start: 2013-02-07 — End: 2013-05-13

## 2013-02-07 MED ORDER — INSULIN SYRINGE/NEEDLE 28G X 1/2" 1 ML MISC
1.0000 | Freq: Every day | Status: DC
Start: 2013-02-07 — End: 2013-02-08

## 2013-02-07 NOTE — Progress Notes (Signed)
Pre visit review using our clinic review tool, if applicable. No additional management support is needed unless otherwise documented below in the visit note. 

## 2013-02-07 NOTE — Patient Instructions (Signed)
Prednisone taper for 6 days augmentin  For 7 days sudafed pe 10 to 30 mg every 8 hours for sinus pressure  Benadryl 25 mg every 8 hours if tolerated for the drainage  Eat a greek yogurt daily to prevent diarrhea

## 2013-02-07 NOTE — Progress Notes (Signed)
Patient ID: David Pineda, male   DOB: January 28, 1955, 58 y.o.   MRN: [id number]   Patient Active Problem List   Diagnosis Date Noted  . Acute URI 12/28/2012  . Special screening for malignant neoplasm of prostate 11/27/2012  . 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 Preventative health care 11/02/2012  . Chest pain, unspecified 11/01/2012  . History of CVA (cerebrovascular accident) 02/12/2012  . DJD (degenerative joint disease) of knee 02/12/2012  . OSA on CPAP   . Obesity, morbid, BMI 40.0-49.9   . Diabetes mellitus without complication   . Hyperlipidemia   . Hypotestosteronism   . Hypertension 11/08/2011    Subjective:  CC:   Chief Complaint  Patient presents with  . Follow-up    3 month followup    HPI:   David Hortonis a 58 y.o. male who presents for follow up on multiple chronic and acute issues.    1) Ear problems:  He reports Persistent fluid on right ear despite treatment several weeks ago for sinusitis/otitis .was treated by [facility] on Dec 24th with azithromycin .  Never  cleared up  Wakes up with sinus congestion., ears stopped up.  Uses CPAP at night.  Nasal drainage,  No pain or persistent congestion. No ear pain, but  feel muffled.   2) OSA: His CPAP machine is several years old, and he has not had a sleep study in 5 years,  3) DM follow up:  BS are not being checked very often.  Diet reviewed  Has a fruit smoothie for breakfast which contains bananas.  Sandwich or salad for lunch.  Fast food about twice per week.  About 3 servings of starch daily.  Avoids sodas.t   4) Obesity Has lost 5 lbs in the last month,.  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  Wants to resume testosterone replacement due to symptoms of fatigue, weakness and impotence.    Past Medical History  Diagnosis Date  . Arthritis   . Elevated blood pressure reading   . OSA on CPAP    . Obesity, morbid, BMI 40.0-49.9   . Diabetes mellitus without complication   . Hypertension   . Hyperlipidemia   . Hypotestosteronism     Past Surgical History  Procedure Laterality Date  . Foot surgery      left        The following portions of the patient's history were reviewed and updated as appropriate: Allergies, current medications, and problem list.    Review of Systems:   12 Pt  review of systems was negative except those addressed in the HPI,     History   Social History  . Marital Status: Divorced    Spouse Name: N/A    Number of Children: N/A  . Years of Education: N/A   Occupational History  . Not on file.   Social History Main Topics  . Smoking status: Former Smoker -- 3.00 packs/day for 7 years    Types: Cigarettes    Quit date: 11/17/1994  . Smokeless tobacco: Not on file  . Alcohol Use: Yes  . Drug Use: No  . Sexual Activity: Not on file   Other Topics Concern  . Not on file   Social History Narrative  . No narrative on file    Objective:  Filed Vitals:   02/07/13 0936  BP: 142/74  Pulse: 86  Temp: 98.6  F (37 C)  Resp: 12     General appearance: alert, cooperative and appears stated age Ears: normal TM's and external ear canals both ears Throat: lips, mucosa, and tongue normal; teeth and gums normal Neck: no adenopathy, no carotid bruit, supple, symmetrical, trachea midline and thyroid not enlarged, symmetric, no tenderness/mass/nodules Back: symmetric, no curvature. ROM normal. No CVA tenderness. Lungs: clear to auscultation bilaterally Heart: regular rate and rhythm, S1, S2 normal, no murmur, click, rub or gallop Abdomen: soft, non-tender; bowel sounds normal; no masses,  no organomegaly Pulses: 2+ and symmetric Skin: Skin color, texture, turgor normal. No rashes or lesions Lymph nodes: Cervical, supraclavicular, and axillary nodes normal.  Assessment and Plan:  Acute URI Persistent symptoms of ear fullness and  discomfort.  Trial of prednisone an doral decongestnant.  If not reoslved.  Referral to ENT advsed   Diabetes mellitus without complication Lab Results  Component Value Date   HGBA1C 7.6* 02/07/2013   hemoglobin A1c is elevated at 7.6.  he is overdue for  eye exams and his foot exam is normal.  he has mild proteinuria by today's urine test.  Will continue current medications but increase glipzide to 10 mg bid    Hyperlipidemia Elevated triglycerides noted on today's fasting profile.  He is on lipitor  .  Advised to follow Low GI diet and exercise   Hypertension Well controlled on current regimen. Renal function stable, no changes today.  Hypotestosteronism Lab Results  Component Value Date   TESTOSTERONE 117* 02/07/2013   Starting depot testosterone for low levels.   OSA on CPAP His last sleep study was over 5 years ago and his persistent sinus/ear symptoms are aggravated by his ill fitting cpap use . He will Will repeat his sleep study   Obesity, morbid, BMI 40.0-49.9 I have congratulated him in reduction of   BMI and encouraged  Continued weight loss with goal of 10% of body weight over the next 6 months using a low glycemic index diet and regular exercise a minimum of 5 days per week.    A total of 40 minutes was spent with patient more than half of which was spent in counseling, reviewing records from other prviders and coordination of care.   Updated Medication List Outpatient Encounter Prescriptions as of 02/07/2013  Medication Sig  . amLODipine (NORVASC) 10 MG tablet Take 1 tablet (10 mg total) by mouth daily.  . Ascorbic Acid (VITAMIN C) 100 MG tablet Take 100 mg by mouth daily.  Marland Kitchen aspirin 81 MG tablet Take 81 mg by mouth daily.  Marland Kitchen atorvastatin (LIPITOR) 20 MG tablet Take 1 tablet (20 mg total) by mouth daily.  . fish oil-omega-3 fatty acids 1000 MG capsule Take 2 g by mouth daily.  Marland Kitchen glipiZIDE (GLUCOTROL) 10 MG tablet TAKE ONE TABLET BY MOUTH TWICE DAILY BEFORE MEAL(S)   . glucosamine-chondroitin 500-400 MG tablet Take 1 tablet by mouth 3 (three) times daily.  . metFORMIN (GLUCOPHAGE) 500 MG tablet Take 1 tablet (500 mg total) by mouth 2 (two) times daily with a meal.  . Multiple Vitamins-Minerals (MULTIVITAMIN WITH MINERALS) tablet Take 1 tablet by mouth daily.  . nebivolol (BYSTOLIC) 5 MG tablet Take 1 tablet (5 mg total) by mouth daily.  . nitroGLYCERIN (NITROSTAT) 0.4 MG SL tablet Place 1 tablet (0.4 mg total) under the tongue every 5 (five) minutes as needed for chest pain.  . sildenafil (VIAGRA) 100 MG tablet Take 1 tablet (100 mg total) by mouth daily as needed for  erectile dysfunction.  . traMADol (ULTRAM) 50 MG tablet Take 1 tablet (50 mg total) by mouth every 6 (six) hours as needed for pain.  . [DISCONTINUED] glipiZIDE (GLUCOTROL) 5 MG tablet TAKE ONE TABLET BY MOUTH TWICE DAILY BEFORE MEAL(S)  . amoxicillin-clavulanate (AUGMENTIN) 875-125 MG per tablet Take 1 tablet by mouth 2 (two) times daily.  . predniSONE (STERAPRED UNI-PAK) 10 MG tablet 6 tablets on Day 1 , then reduce by 1 tablet daily until gone  . [DISCONTINUED] amoxicillin (AMOXIL) 500 MG capsule Take 1 capsule (500 mg total) by mouth 3 (three) times daily.  . [DISCONTINUED] azithromycin (ZITHROMAX) 250 MG tablet Take 2 tablets today then 1 tablet daily for the next 4 days.  . [DISCONTINUED] brompheniramine-pseudoephedrine (DIMETAPP) 1-15 MG/5ML ELIX Take 5 mLs by mouth every 6 (six) hours as needed for allergies.  . [DISCONTINUED] INS SYRINGE/NEEDLE 1CC/28G (B-D INSULIN SYRINGE 1CC/28G) 28G X 1/2" 1 ML MISC 1 Syringe by Does not apply route daily after supper.  . [DISCONTINUED] predniSONE (STERAPRED UNI-PAK) 10 MG tablet 6 tablets on Day 1 , then reduce by 1 tablet daily until gone  . [DISCONTINUED] predniSONE (STERAPRED UNI-PAK) 10 MG tablet 6 tablets on Day 1 , then reduce by 1 tablet daily until gone

## 2013-02-08 ENCOUNTER — Other Ambulatory Visit: Payer: Self-pay | Admitting: *Deleted

## 2013-02-08 ENCOUNTER — Other Ambulatory Visit (INDEPENDENT_AMBULATORY_CARE_PROVIDER_SITE_OTHER): Payer: BC Managed Care – PPO

## 2013-02-08 ENCOUNTER — Telehealth: Payer: Self-pay

## 2013-02-08 DIAGNOSIS — E119 Type 2 diabetes mellitus without complications: Secondary | ICD-10-CM

## 2013-02-08 LAB — LIPID PANEL
CHOL/HDL RATIO: 4
Cholesterol: 186 mg/dL (ref 0–200)
HDL: 44.4 mg/dL (ref >39.00)
Triglycerides: 273 mg/dL — ABNORMAL HIGH (ref 0.0–149.0)
VLDL: 54.6 mg/dL — ABNORMAL HIGH (ref 0.0–40.0)

## 2013-02-08 LAB — COMPREHENSIVE METABOLIC PANEL WITH GFR
ALT: 35 U/L (ref 0–53)
AST: 23 U/L (ref 0–37)
Albumin: 4.2 g/dL (ref 3.5–5.2)
Alkaline Phosphatase: 84 U/L (ref 39–117)
BUN: 21 mg/dL (ref 6–23)
CO2: 26 meq/L (ref 19–32)
Calcium: 9.5 mg/dL (ref 8.4–10.5)
Chloride: 101 meq/L (ref 96–112)
Creatinine, Ser: 0.9 mg/dL (ref 0.4–1.5)
GFR: 90.74 mL/min
Glucose, Bld: 291 mg/dL — ABNORMAL HIGH (ref 70–99)
Potassium: 4.1 meq/L (ref 3.5–5.1)
Sodium: 136 meq/L (ref 135–145)
Total Bilirubin: 0.6 mg/dL (ref 0.3–1.2)
Total Protein: 7.4 g/dL (ref 6.0–8.3)

## 2013-02-08 LAB — MICROALBUMIN / CREATININE URINE RATIO
Creatinine,U: 181.9 mg/dL
Microalb Creat Ratio: 3.7 mg/g (ref 0.0–30.0)
Microalb, Ur: 6.7 mg/dL — ABNORMAL HIGH (ref 0.0–1.9)

## 2013-02-08 LAB — LDL CHOLESTEROL, DIRECT: Direct LDL: 124.2 mg/dL

## 2013-02-08 LAB — HEMOGLOBIN A1C: Hgb A1c MFr Bld: 7.6 % — ABNORMAL HIGH (ref 4.6–6.5)

## 2013-02-08 MED ORDER — "INSULIN SYRINGE/NEEDLE 28G X 1/2"" 1 ML MISC": 1.0000 | Freq: Every day | Status: DC

## 2013-02-08 NOTE — Telephone Encounter (Signed)
Relevant patient education assigned to patient using Emmi. ° °

## 2013-02-09 ENCOUNTER — Encounter: Payer: Self-pay | Admitting: Internal Medicine

## 2013-02-09 NOTE — Assessment & Plan Note (Signed)
Persistent symptoms of ear fullness and discomfort.  Trial of prednisone an doral decongestnant.  If not reoslved.  Referral to ENT advsed

## 2013-02-10 ENCOUNTER — Encounter: Payer: Self-pay | Admitting: Internal Medicine

## 2013-02-10 MED ORDER — GLIPIZIDE 10 MG PO TABS: ORAL_TABLET | ORAL | Status: DC

## 2013-02-10 NOTE — Assessment & Plan Note (Signed)
I have congratulated him in reduction of   BMI and encouraged  Continued weight loss with goal of 10% of body weight over the next 6 months using a low glycemic index diet and regular exercise a minimum of 5 days per week.

## 2013-02-10 NOTE — Assessment & Plan Note (Addendum)
Lab Results  Component Value Date   HGBA1C 7.6* 02/07/2013   hemoglobin A1c is elevated at 7.6.  he is overdue for  eye exams and his foot exam is normal.  he has mild proteinuria by today's urine test.  Will continue current medications but increase glipzide to 10 mg bid

## 2013-02-10 NOTE — Assessment & Plan Note (Addendum)
His last sleep study was over 5 years ago and his persistent sinus/ear symptoms are aggravated by his ill fitting cpap use . He will Will repeat his sleep study

## 2013-02-10 NOTE — Assessment & Plan Note (Signed)
Lab Results  Component Value Date   TESTOSTERONE 117* 02/07/2013   Starting depot testosterone for low levels.

## 2013-02-10 NOTE — Assessment & Plan Note (Addendum)
Elevated triglycerides noted on today's fasting profile.  He is on lipitor  .  Advised to follow Low GI diet and exercise

## 2013-02-10 NOTE — Assessment & Plan Note (Signed)
Well controlled on current regimen. Renal function stable, no changes today. 

## 2013-02-11 LAB — TESTOSTERONE, FREE, TOTAL, SHBG
Sex Hormone Binding: 15 nmol/L (ref 13–71)
TESTOSTERONE FREE: 31.6 pg/mL — AB (ref 47.0–244.0)
TESTOSTERONE-% FREE: 2.7 % (ref 1.6–2.9)
Testosterone: 117 ng/dL — ABNORMAL LOW (ref 300–890)

## 2013-02-13 NOTE — Telephone Encounter (Signed)
Mailed unread message to pt, requested call back or reply to discuss

## 2013-02-13 NOTE — Telephone Encounter (Signed)
Mailed unread message to pt, requested call back or reply to discuss 

## 2013-02-14 MED ORDER — TESTOSTERONE CYPIONATE 200 MG/ML IM SOLN: 200.0000 mg | INTRAMUSCULAR | Status: DC

## 2013-02-14 NOTE — Telephone Encounter (Signed)
Please call in the testosterone rx or wait until i come in to sign if necessary .  thanks

## 2013-02-14 NOTE — Addendum Note (Signed)
Addended by: Crecencio Mc on: 02/14/2013 09:58 AM   Modules accepted: Orders

## 2013-02-20 ENCOUNTER — Other Ambulatory Visit: Payer: Self-pay | Admitting: Internal Medicine

## 2013-02-20 ENCOUNTER — Telehealth: Payer: Self-pay | Admitting: Internal Medicine

## 2013-02-20 DIAGNOSIS — G4733 Obstructive sleep apnea (adult) (pediatric): Secondary | ICD-10-CM

## 2013-02-20 DIAGNOSIS — Z9989 Dependence on other enabling machines and devices: Secondary | ICD-10-CM

## 2013-02-20 NOTE — Telephone Encounter (Signed)
Left detailed message for patient to return call to office and schedule a nurse visit for T shot instruction.

## 2013-02-20 NOTE — Telephone Encounter (Signed)
For the refresher on T shots, you can set that up with Juliann Pulse. I'll have her call you.

## 2013-02-25 NOTE — Telephone Encounter (Signed)
Pt scheduled appt for 02/28/13

## 2013-02-28 ENCOUNTER — Ambulatory Visit: Payer: BC Managed Care – PPO | Admitting: *Deleted

## 2013-02-28 ENCOUNTER — Telehealth: Payer: Self-pay | Admitting: *Deleted

## 2013-02-28 DIAGNOSIS — E119 Type 2 diabetes mellitus without complications: Secondary | ICD-10-CM

## 2013-02-28 NOTE — Progress Notes (Unsigned)
Patient came in for nurse visit to learn proper injection for IM testosterone, Patient did very well drawing up meds and report doing this previously for himself patient did not bring in testosterone so we practiced using sterile saline.

## 2013-02-28 NOTE — Telephone Encounter (Signed)
Called [employer] [place], for prior authorization on the Depo-testost 200 mg, request form placed in Dr.Tullos box

## 2013-03-01 DIAGNOSIS — Z0279 Encounter for issue of other medical certificate: Secondary | ICD-10-CM

## 2013-04-22 ENCOUNTER — Other Ambulatory Visit: Payer: Self-pay | Admitting: Internal Medicine

## 2013-04-22 ENCOUNTER — Other Ambulatory Visit: Payer: Self-pay | Admitting: Adult Health

## 2013-04-22 NOTE — Telephone Encounter (Signed)
Electronic Rx request, please advice

## 2013-04-23 NOTE — Telephone Encounter (Signed)
Rx faxed to pharmacy  

## 2013-05-13 ENCOUNTER — Encounter: Payer: Self-pay | Admitting: Cardiovascular Disease

## 2013-05-13 ENCOUNTER — Ambulatory Visit (INDEPENDENT_AMBULATORY_CARE_PROVIDER_SITE_OTHER): Payer: BC Managed Care – PPO | Admitting: Cardiovascular Disease

## 2013-05-13 VITALS — BP 140/80 | HR 71 | Ht 74.0 in | Wt 315.0 lb

## 2013-05-13 DIAGNOSIS — R079 Chest pain, unspecified: Secondary | ICD-10-CM

## 2013-05-13 DIAGNOSIS — I10 Essential (primary) hypertension: Secondary | ICD-10-CM

## 2013-05-13 DIAGNOSIS — E119 Type 2 diabetes mellitus without complications: Secondary | ICD-10-CM

## 2013-05-13 DIAGNOSIS — E785 Hyperlipidemia, unspecified: Secondary | ICD-10-CM

## 2013-05-13 NOTE — Patient Instructions (Signed)
You are doing well. No medication changes were made.  Please call us if you have new issues that need to be addressed before your next appt.  Your physician wants you to follow-up in: 12 months.  You will receive a reminder letter in the mail two months in advance. If you don't receive a letter, please call our office to schedule the follow-up appointment. 

## 2013-05-13 NOTE — Assessment & Plan Note (Signed)
We have encouraged continued exercise, careful diet management in an effort to lose weight. Pravastatin could be increased if needed

## 2013-05-13 NOTE — Assessment & Plan Note (Signed)
Reports that he is changing his diet, try to eat better in an effort to lose weight

## 2013-05-13 NOTE — Progress Notes (Signed)
Patient ID: David Pineda, male    DOB: 11-16-1955, 58 y.o.   MRN: [id number]  HPI Comments:  Mr. Trompeter is a pleasant 58 year old gentleman with history of low testosterone, diabetes, hypertension, obesity, hyperlipidemia who was previously evaluated for left-sided chest pain. He presents for routine followup  In followup today, he reports that he is doing relatively well. He denies having any chest pain. He is working hard, does a pressure washing business. When he is active, denies having any chest discomfort. He does not do regular exercise program. Still having problems with his weight  He takes pravastatin 40 mg daily Hemoglobin A1c January 2015 was 7.6, total cholesterol 186, LDL 124  EKG shows normal sinus rhythm with rate 71 beats per minute, no significant ST or T wave changes. He does not know his family history as he was adopted He does report a remote smoking history, stopped at age 44 Previous Hemoglobin A1c 7.5   Outpatient Encounter Prescriptions as of 05/13/2013  Medication Sig  . amLODipine (NORVASC) 10 MG tablet Take 1 tablet (10 mg total) by mouth daily.  . Ascorbic Acid (VITAMIN C) 100 MG tablet Take 100 mg by mouth daily.  Marland Kitchen aspirin 325 MG tablet Take 325 mg by mouth daily.  . fish oil-omega-3 fatty acids 1000 MG capsule Take 2 g by mouth daily.  Marland Kitchen glipiZIDE (GLUCOTROL) 10 MG tablet TAKE ONE TABLET BY MOUTH TWICE DAILY BEFORE MEAL(S)  . glucosamine-chondroitin 500-400 MG tablet Take 1 tablet by mouth 3 (three) times daily.  . INS SYRINGE/NEEDLE 1CC/28G (B-D INSULIN SYRINGE 1CC/28G) 28G X 1/2" 1 ML MISC 1 Syringe by Does not apply route daily after supper.  . metFORMIN (GLUCOPHAGE) 500 MG tablet Take 1 tablet (500 mg total) by mouth 2 (two) times daily with a meal.  . Multiple Vitamins-Minerals (MULTIVITAMIN WITH MINERALS) tablet Take 1 tablet by mouth daily.  . pravastatin (PRAVACHOL) 40 MG tablet TAKE ONE TABLET BY MOUTH EVERY DAY  . predniSONE (STERAPRED UNI-PAK)  10 MG tablet 6 tablets on Day 1 , then reduce by 1 tablet daily until gone  . sildenafil (VIAGRA) 100 MG tablet Take 1 tablet (100 mg total) by mouth daily as needed for erectile dysfunction.  Marland Kitchen testosterone cypionate (DEPOTESTOTERONE CYPIONATE) 200 MG/ML injection Inject 1 mL (200 mg total) into the muscle every 14 (fourteen) days.  . traMADol (ULTRAM) 50 MG tablet TAKE ONE TABLET BY MOUTH EVERY 6 HOURS AS NEEDED FOR PAIN   Review of Systems  Constitutional: Negative.   HENT: Negative.   Eyes: Negative.   Cardiovascular: Negative.   Gastrointestinal: Negative.   Endocrine: Negative.   Musculoskeletal: Negative.   Skin: Negative.   Allergic/Immunologic: Negative.   Neurological: Negative.   Hematological: Negative.   Psychiatric/Behavioral: Negative.   All other systems reviewed and are negative.   BP 168/90  Pulse 71  Ht 6\' 2"  (1.88 m)  Wt 315 lb (142.883 kg)  BMI 40.43 kg/m2 Repeat blood pressure down to 140/80 Physical Exam  Nursing note and vitals reviewed. Constitutional: He is oriented to person, place, and time. He appears well-developed and well-nourished.  HENT:  Head: Normocephalic.  Nose: Nose normal.  Mouth/Throat: Oropharynx is clear and moist.  Eyes: Conjunctivae are normal. Pupils are equal, round, and reactive to light.  Neck: Normal range of motion. Neck supple. No JVD present.  Cardiovascular: Normal rate, regular rhythm, S1 normal, S2 normal, normal heart sounds and intact distal pulses.  Exam reveals no gallop and no friction rub.  No murmur heard. Pulmonary/Chest: Effort normal and breath sounds normal. No respiratory distress. He has no wheezes. He has no rales. He exhibits no tenderness.  Abdominal: Soft. Bowel sounds are normal. He exhibits no distension. There is no tenderness.  Musculoskeletal: Normal range of motion. He exhibits no edema and no tenderness.  Lymphadenopathy:    He has no cervical adenopathy.  Neurological: He is alert and oriented  to person, place, and time. Coordination normal.  Skin: Skin is warm and dry. No rash noted. No erythema.  Psychiatric: He has a normal mood and affect. His behavior is normal. Judgment and thought content normal.      Assessment and Plan

## 2013-05-13 NOTE — Assessment & Plan Note (Signed)
We have encouraged continued exercise, careful diet management in an effort to lose weight. 

## 2013-05-13 NOTE — Assessment & Plan Note (Signed)
No further episodes of chest pain noted. No further workup at this time

## 2013-05-13 NOTE — Assessment & Plan Note (Signed)
Blood pressure is well controlled on today's visit. No changes made to the medications. 

## 2013-05-16 ENCOUNTER — Ambulatory Visit (INDEPENDENT_AMBULATORY_CARE_PROVIDER_SITE_OTHER): Payer: BC Managed Care – PPO | Admitting: Internal Medicine

## 2013-05-16 ENCOUNTER — Encounter: Payer: Self-pay | Admitting: Internal Medicine

## 2013-05-16 VITALS — BP 158/82 | HR 73 | Temp 98.0°F | Resp 16 | Wt 311.0 lb

## 2013-05-16 DIAGNOSIS — I10 Essential (primary) hypertension: Secondary | ICD-10-CM

## 2013-05-16 DIAGNOSIS — G4733 Obstructive sleep apnea (adult) (pediatric): Secondary | ICD-10-CM

## 2013-05-16 DIAGNOSIS — E119 Type 2 diabetes mellitus without complications: Secondary | ICD-10-CM

## 2013-05-16 DIAGNOSIS — Z9989 Dependence on other enabling machines and devices: Secondary | ICD-10-CM

## 2013-05-16 DIAGNOSIS — E291 Testicular hypofunction: Secondary | ICD-10-CM

## 2013-05-16 DIAGNOSIS — E785 Hyperlipidemia, unspecified: Secondary | ICD-10-CM

## 2013-05-16 DIAGNOSIS — H6980 Other specified disorders of Eustachian tube, unspecified ear: Secondary | ICD-10-CM

## 2013-05-16 DIAGNOSIS — E349 Endocrine disorder, unspecified: Secondary | ICD-10-CM

## 2013-05-16 MED ORDER — TESTOSTERONE CYPIONATE 200 MG/ML IM SOLN: 200.0000 mg | INTRAMUSCULAR | Status: DC

## 2013-05-16 MED ORDER — AZELASTINE-FLUTICASONE 137-50 MCG/ACT NA SUSP: 2.0000 | Freq: Every day | NASAL | Status: DC

## 2013-05-16 NOTE — Patient Instructions (Signed)
Your testosterone has been approved for the next 25 years! The insurance was supposed to have sent you a letter  You need to return on Monday for fasting labs  Several inexpensive low carb meals I have found recently at [employer] ("poor man's rib eye")  Bok Choy: a green stalky  Vegetable that stir fries well with mushrooms  Rotisserie chicken  Consder breakfast for dinner (eggs and bacon)

## 2013-05-16 NOTE — Progress Notes (Signed)
Patient ID: David Pineda, male   DOB: 1955-08-13, 58 y.o.   MRN: [id number]   Patient Active Problem List   Diagnosis Date Noted  . Acute URI 12/28/2012  . Special screening for malignant neoplasm of prostate 11/27/2012  . 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 Preventative health care 11/02/2012  . Chest pain, unspecified 11/01/2012  . History of CVA (cerebrovascular accident) 02/12/2012  . DJD (degenerative joint disease) of knee 02/12/2012  . OSA on CPAP   . Obesity, morbid, BMI 40.0-49.9   . Diabetes mellitus without complication   . Hyperlipidemia   . Hypotestosteronism   . Hypertension 11/08/2011    Subjective:  CC:   Chief Complaint  Patient presents with  . Follow-up  . Diabetes    HPI:   David Pineda is a 58 y.o. male who presents for Follow up on diabetes mellitus,  Hypertension and hyperlipidemia.  He has not checking sugars lately due to his busy work schedule, and is not exercising or watching his diet.  He has gained about 10 lbs since his last visit 3 months ago.  He continues to have problems with his right ear since January, a feeling of fluid without pain.     Past Medical History  Diagnosis Date  . Arthritis   . Elevated blood pressure reading   . OSA on CPAP   . Obesity, morbid, BMI 40.0-49.9   . Diabetes mellitus without complication   . Hypertension   . Hyperlipidemia   . Hypotestosteronism     Past Surgical History  Procedure Laterality Date  . Foot surgery      left        The following portions of the patient's history were reviewed and updated as appropriate: Allergies, current medications, and problem list.    Review of Systems:   Patient denies headache, fevers, malaise, unintentional weight loss, skin rash, eye pain, sinus congestion and sinus pain, sore throat, dysphagia,  hemoptysis , cough, dyspnea, wheezing, chest pain, palpitations, orthopnea, edema, abdominal pain, nausea, melena, diarrhea, constipation,  flank pain, dysuria, hematuria, urinary  Frequency, nocturia, numbness, tingling, seizures,  Focal weakness, Loss of consciousness,  Tremor, insomnia, depression, anxiety, and suicidal ideation.     History   Social History  . Marital Status: Divorced    Spouse Name: N/A    Number of Children: N/A  . Years of Education: N/A   Occupational History  . Not on file.   Social History Main Topics  . Smoking status: Former Smoker -- 3.00 packs/day for 7 years    Types: Cigarettes    Quit date: 11/17/1994  . Smokeless tobacco: Not on file  . Alcohol Use: Yes  . Drug Use: No  . Sexual Activity: Not on file   Other Topics Concern  . Not on file   Social History Narrative  . No narrative on file    Objective:  Filed Vitals:   05/16/13 0802  BP: 158/82  Pulse: 73  Temp: 98 F (36.7 C)  Resp: 16     General appearance: alert, cooperative and appears stated age Ears: normal TM's and external ear canals both ears Throat: lips, mucosa, and tongue normal; teeth and gums normal Neck: no adenopathy, no carotid bruit, supple, symmetrical, trachea midline and thyroid not enlarged, symmetric, no tenderness/mass/nodules Back: symmetric, no curvature. ROM normal. No CVA tenderness. Lungs: clear to auscultation bilaterally Heart: regular rate and rhythm, S1, S2 normal, no murmur, click, rub or gallop Abdomen: soft, non-tender; bowel  sounds normal; no masses,  no organomegaly Pulses: 2+ and symmetric Skin: Skin color, texture, turgor normal. No rashes or lesions Lymph nodes: Cervical, supraclavicular, and axillary nodes normal.  Assessment and Plan:  Hypertension Elevated today.  Reviewed list of meds, patient is not taking OTC meds that could be causing,. It.  Have asked patient to recheck bp at home a minimum of 5 times over the next 4 weeks and call readings to office for adjustment of medications.    OSA on CPAP Diagnosed by sleep study. he is wearing her CPAP every night a  minimum of 6 hours per night.   Diabetes mellitus without complication Loss of control suspected as he has not been following the low GI diet and has not been checking his sugars. A1c and fasting lipids are due.Marland Kitchen   Hypotestosteronism Untreated testosterone level is < 200.  He has been approved for testosterone  Replacement.   Lab Results  Component Value Date   TESTOSTERONE 117* 02/07/2013     Hyperlipidemia Managed with pravastatin . Fasting lipids due.    Chronic eustachian tube dysfunction Trial of Dymista nasal spray.   Updated Medication List Outpatient Encounter Prescriptions as of 05/16/2013  Medication Sig  . amLODipine (NORVASC) 10 MG tablet Take 1 tablet (10 mg total) by mouth daily.  . Ascorbic Acid (VITAMIN C) 100 MG tablet Take 100 mg by mouth daily.  Marland Kitchen aspirin 325 MG tablet Take 325 mg by mouth daily.  . fish oil-omega-3 fatty acids 1000 MG capsule Take 2 g by mouth daily.  Marland Kitchen glipiZIDE (GLUCOTROL) 10 MG tablet TAKE ONE TABLET BY MOUTH TWICE DAILY BEFORE MEAL(S)  . glucosamine-chondroitin 500-400 MG tablet Take 1 tablet by mouth 3 (three) times daily.  . INS SYRINGE/NEEDLE 1CC/28G (B-D INSULIN SYRINGE 1CC/28G) 28G X 1/2" 1 ML MISC 1 Syringe by Does not apply route daily after supper.  . metFORMIN (GLUCOPHAGE) 500 MG tablet Take 1 tablet (500 mg total) by mouth 2 (two) times daily with a meal.  . Multiple Vitamins-Minerals (MULTIVITAMIN WITH MINERALS) tablet Take 1 tablet by mouth daily.  . pravastatin (PRAVACHOL) 40 MG tablet TAKE ONE TABLET BY MOUTH EVERY DAY  . sildenafil (VIAGRA) 100 MG tablet Take 1 tablet (100 mg total) by mouth daily as needed for erectile dysfunction.  Marland Kitchen testosterone cypionate (DEPOTESTOTERONE CYPIONATE) 200 MG/ML injection Inject 1 mL (200 mg total) into the muscle every 14 (fourteen) days.  . traMADol (ULTRAM) 50 MG tablet TAKE ONE TABLET BY MOUTH EVERY 6 HOURS AS NEEDED FOR PAIN  . [DISCONTINUED] testosterone cypionate (DEPOTESTOTERONE  CYPIONATE) 200 MG/ML injection Inject 1 mL (200 mg total) into the muscle every 14 (fourteen) days.  . Azelastine-Fluticasone (DYMISTA) 137-50 MCG/ACT SUSP Place 2 Squirts into the nose daily. In each nostril  . [DISCONTINUED] predniSONE (STERAPRED UNI-PAK) 10 MG tablet 6 tablets on Day 1 , then reduce by 1 tablet daily until gone

## 2013-05-16 NOTE — Progress Notes (Signed)
Pre-visit discussion using our clinic review tool. No additional management support is needed unless otherwise documented below in the visit note.  

## 2013-05-18 ENCOUNTER — Encounter: Payer: Self-pay | Admitting: Internal Medicine

## 2013-05-18 DIAGNOSIS — H6980 Other specified disorders of Eustachian tube, unspecified ear: Secondary | ICD-10-CM | POA: Insufficient documentation

## 2013-05-18 NOTE — Assessment & Plan Note (Signed)
Managed with pravastatin . Fasting lipids due.

## 2013-05-18 NOTE — Assessment & Plan Note (Signed)
Untreated testosterone level is < 200.  He has been approved for testosterone  Replacement.   Lab Results  Component Value Date   TESTOSTERONE 117* 02/07/2013

## 2013-05-18 NOTE — Assessment & Plan Note (Signed)
Trial of Dymista nasal spray.

## 2013-05-18 NOTE — Assessment & Plan Note (Signed)
Elevated today.  Reviewed list of meds, patient is not taking OTC meds that could be causing,. It.  Have asked patient to recheck bp at home a minimum of 5 times over the next 4 weeks and call readings to office for adjustment of medications.   

## 2013-05-18 NOTE — Assessment & Plan Note (Signed)
Loss of control suspected as he has not been following the low GI diet and has not been checking his sugars. A1c and fasting lipids are due.Marland Kitchen

## 2013-05-18 NOTE — Assessment & Plan Note (Signed)
Diagnosed by sleep study. he is wearing her CPAP every night a minimum of 6 hours per night.  

## 2013-05-21 ENCOUNTER — Other Ambulatory Visit: Payer: BC Managed Care – PPO

## 2013-05-27 ENCOUNTER — Other Ambulatory Visit: Payer: Self-pay | Admitting: Internal Medicine

## 2013-05-27 ENCOUNTER — Other Ambulatory Visit: Payer: BC Managed Care – PPO

## 2013-05-27 DIAGNOSIS — E119 Type 2 diabetes mellitus without complications: Secondary | ICD-10-CM

## 2013-05-31 ENCOUNTER — Other Ambulatory Visit: Payer: BC Managed Care – PPO

## 2013-06-12 ENCOUNTER — Encounter: Payer: Self-pay | Admitting: Internal Medicine

## 2013-07-25 ENCOUNTER — Ambulatory Visit (INDEPENDENT_AMBULATORY_CARE_PROVIDER_SITE_OTHER): Payer: BC Managed Care – PPO | Admitting: Internal Medicine

## 2013-07-25 ENCOUNTER — Encounter: Payer: Self-pay | Admitting: Internal Medicine

## 2013-07-25 VITALS — BP 150/82 | HR 86 | Temp 98.9°F | Resp 18 | Ht 74.0 in | Wt 309.0 lb

## 2013-07-25 DIAGNOSIS — E349 Endocrine disorder, unspecified: Secondary | ICD-10-CM

## 2013-07-25 DIAGNOSIS — E119 Type 2 diabetes mellitus without complications: Secondary | ICD-10-CM

## 2013-07-25 DIAGNOSIS — F411 Generalized anxiety disorder: Secondary | ICD-10-CM

## 2013-07-25 DIAGNOSIS — I10 Essential (primary) hypertension: Secondary | ICD-10-CM

## 2013-07-25 DIAGNOSIS — E291 Testicular hypofunction: Secondary | ICD-10-CM

## 2013-07-25 LAB — MICROALBUMIN / CREATININE URINE RATIO
CREATININE, U: 191.3 mg/dL
Microalb Creat Ratio: 0.9 mg/g (ref 0.0–30.0)
Microalb, Ur: 1.8 mg/dL (ref 0.0–1.9)

## 2013-07-25 LAB — LIPID PANEL
CHOL/HDL RATIO: 4
Cholesterol: 174 mg/dL (ref 0–200)
HDL: 45 mg/dL (ref >39.00)
LDL CALC: 85 mg/dL (ref 0–99)
NonHDL: 129
Triglycerides: 218 mg/dL — ABNORMAL HIGH (ref 0.0–149.0)
VLDL: 43.6 mg/dL — ABNORMAL HIGH (ref 0.0–40.0)

## 2013-07-25 LAB — COMPREHENSIVE METABOLIC PANEL WITH GFR
ALT: 54 U/L — ABNORMAL HIGH (ref 0–53)
AST: 51 U/L — ABNORMAL HIGH (ref 0–37)
Albumin: 4.4 g/dL (ref 3.5–5.2)
Alkaline Phosphatase: 65 U/L (ref 39–117)
BUN: 11 mg/dL (ref 6–23)
CO2: 22 meq/L (ref 19–32)
Calcium: 9.3 mg/dL (ref 8.4–10.5)
Chloride: 102 meq/L (ref 96–112)
Creatinine, Ser: 0.8 mg/dL (ref 0.4–1.5)
GFR: 103.46 mL/min
Glucose, Bld: 206 mg/dL — ABNORMAL HIGH (ref 70–99)
Potassium: 3.8 meq/L (ref 3.5–5.1)
Sodium: 136 meq/L (ref 135–145)
Total Bilirubin: 0.7 mg/dL (ref 0.2–1.2)
Total Protein: 7.4 g/dL (ref 6.0–8.3)

## 2013-07-25 LAB — HM DIABETES FOOT EXAM: HM Diabetic Foot Exam: ABNORMAL

## 2013-07-25 LAB — HEMOGLOBIN A1C: Hgb A1c MFr Bld: 7.2 % — ABNORMAL HIGH (ref 4.6–6.5)

## 2013-07-25 MED ORDER — LISINOPRIL 10 MG PO TABS: 10.0000 mg | ORAL_TABLET | Freq: Every day | ORAL | Status: DC

## 2013-07-25 MED ORDER — ALPRAZOLAM 0.5 MG PO TABS: 0.5000 mg | ORAL_TABLET | Freq: Every evening | ORAL | Status: DC | PRN
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
## 2013-07-25 NOTE — Patient Instructions (Signed)
Your blood pressure is not at goal on amlodipine alone.  I an ADDING lisinopril 10 mg one tablet daily  Your testosterone needs injecting every 2 weeks.   1 ml    We should recheck your testosterone level in  6 weeks but we can wait until your next A1c in 3 months to save you $$$$  You are overdue for your eye exam!!!! Please make this a priority  Please make losing weight a priority as well.  Goal is 30 lbs over the next 6 months (5 lbs/month)  Diabetes and Exercise Exercising regularly is important. It is not just about losing weight. It has many health benefits, such as:  Improving your overall fitness, flexibility, and endurance.  Increasing your bone density.  Helping with weight control.  Decreasing your body fat.  Increasing your muscle strength.  Reducing stress and tension.  Improving your overall health. People with diabetes who exercise gain additional benefits because exercise:  Reduces appetite.  Improves the body's use of blood sugar (glucose).  Helps lower or control blood glucose.  Decreases blood pressure.  Helps control blood lipids (such as cholesterol and triglycerides).  Improves the body's use of the hormone insulin by:  Increasing the body's insulin sensitivity.  Reducing the body's insulin needs.  Decreases the risk for heart disease because exercising:  Lowers cholesterol and triglycerides levels.  Increases the levels of good cholesterol (such as high-density lipoproteins [HDL]) in the body.  Lowers blood glucose levels. YOUR ACTIVITY PLAN  Choose an activity that you enjoy and set realistic goals. Your health care provider or diabetes educator can help you make an activity plan that works for you. You can break activities into 2 or 3 sessions throughout the day. Doing so is as good as one long session. Exercise ideas include:  Taking the dog for a walk.  Taking the stairs instead of the elevator.  Dancing to your favorite  song.  Doing your favorite exercise with a friend. RECOMMENDATIONS FOR EXERCISING WITH TYPE 1 OR TYPE 2 DIABETES   Check your blood glucose before exercising. If blood glucose levels are greater than 240 mg/dL, check for urine ketones. Do not exercise if ketones are present.  Avoid injecting insulin into areas of the body that are going to be exercised. For example, avoid injecting insulin into:  The arms when playing tennis.  The legs when jogging.  Keep a record of:  Food intake before and after you exercise.  Expected peak times of insulin action.  Blood glucose levels before and after you exercise.  The type and amount of exercise you have done.  Review your records with your health care provider. Your health care provider will help you to develop guidelines for adjusting food intake and insulin amounts before and after exercising.  If you take insulin or oral hypoglycemic agents, watch for signs and symptoms of hypoglycemia. They include:  Dizziness.  Shaking.  Sweating.  Chills.  Confusion.  Drink plenty of water while you exercise to prevent dehydration or heat stroke. Body water is lost during exercise and must be replaced.  Talk to your health care provider before starting an exercise program to make sure it is safe for you. Remember, almost any type of activity is better than none. Document Released: 03/19/2003 Document Revised: 08/29/2012 Document Reviewed: 06/05/2012 [facility] Patient Information 2015 [place], [place]. This information is not intended to replace advice given to you by your health care provider. Make sure you discuss any questions you have  with your health care provider.

## 2013-07-25 NOTE — Progress Notes (Signed)
Pre-visit discussion using our clinic review tool. No additional management support is needed unless otherwise documented below in the visit note.  

## 2013-07-25 NOTE — Assessment & Plan Note (Addendum)
Not at goal on current meds.  Adding lisinopril 10 mg daily .

## 2013-07-26 ENCOUNTER — Encounter: Payer: Self-pay | Admitting: Internal Medicine

## 2013-07-26 ENCOUNTER — Telehealth: Payer: Self-pay | Admitting: Internal Medicine

## 2013-07-26 LAB — TESTOSTERONE, FREE, TOTAL, SHBG
Sex Hormone Binding: 16 nmol/L (ref 13–71)
TESTOSTERONE-% FREE: 2.7 % (ref 1.6–2.9)
Testosterone, Free: 36.1 pg/mL — ABNORMAL LOW (ref 47.0–244.0)
Testosterone: 136 ng/dL — ABNORMAL LOW (ref 300–890)

## 2013-07-26 NOTE — Telephone Encounter (Signed)
Relevant patient education assigned to patient using Emmi. ° °

## 2013-07-27 ENCOUNTER — Encounter: Payer: Self-pay | Admitting: Internal Medicine

## 2013-07-27 NOTE — Assessment & Plan Note (Signed)
Resuming testosterone replacement.  Repeat level in 6 weeks

## 2013-07-27 NOTE — Assessment & Plan Note (Signed)
well-controlled on current medications.  hemoglobin A1c has been consistently near or  less than 7.0 . Patient is up-to-date on eye exams and foot exam is normal today. Patient is due for urine microalbumin to creatinine ratio at next visit. Patient is tolerating statin therapy for CAD risk reduction and on ACE/ARB for reduction in proteinuria.   Lab Results  Component Value Date   HGBA1C 7.2* 07/25/2013   Lab Results  Component Value Date   MICROALBUR 1.8 07/25/2013   Lab Results  Component Value Date   CHOL 174 07/25/2013   HDL 45.00 07/25/2013   LDLCALC 85 07/25/2013   LDLDIRECT 124.2 02/08/2013   TRIG 218.0* 07/25/2013   CHOLHDL 4 07/25/2013

## 2013-07-29 NOTE — Telephone Encounter (Signed)
Mailed unread message to pt  

## 2013-08-02 ENCOUNTER — Encounter: Payer: Self-pay | Admitting: *Deleted

## 2013-08-02 NOTE — Progress Notes (Signed)
Chart reviewed for DM bundle. OV 07/25/13 LDL within parameters 07/25/13 Appt sch 10/31/13

## 2013-08-27 ENCOUNTER — Encounter: Payer: Self-pay | Admitting: *Deleted

## 2013-08-29 NOTE — Telephone Encounter (Signed)
Mailed unread message to pt  

## 2013-09-26 ENCOUNTER — Other Ambulatory Visit: Payer: Self-pay | Admitting: Internal Medicine

## 2013-09-30 ENCOUNTER — Ambulatory Visit: Payer: BC Managed Care – PPO | Admitting: *Deleted

## 2013-09-30 VITALS — BP 126/78 | HR 92

## 2013-09-30 DIAGNOSIS — I10 Essential (primary) hypertension: Secondary | ICD-10-CM

## 2013-09-30 NOTE — Progress Notes (Signed)
Pt presented for DM bundle BP check. Doing well without complaints. Reviewed BP medications, verified he is taking as directed. BP 126/78.

## 2013-10-30 ENCOUNTER — Telehealth: Payer: Self-pay | Admitting: *Deleted

## 2013-10-30 DIAGNOSIS — E349 Endocrine disorder, unspecified: Secondary | ICD-10-CM

## 2013-10-30 DIAGNOSIS — E785 Hyperlipidemia, unspecified: Secondary | ICD-10-CM

## 2013-10-30 DIAGNOSIS — I10 Essential (primary) hypertension: Secondary | ICD-10-CM

## 2013-10-30 DIAGNOSIS — E119 Type 2 diabetes mellitus without complications: Secondary | ICD-10-CM

## 2013-10-30 NOTE — Telephone Encounter (Signed)
Pt is coming tomorrow what labs and dx?  

## 2013-10-31 ENCOUNTER — Other Ambulatory Visit (INDEPENDENT_AMBULATORY_CARE_PROVIDER_SITE_OTHER): Payer: BC Managed Care – PPO

## 2013-10-31 ENCOUNTER — Ambulatory Visit (INDEPENDENT_AMBULATORY_CARE_PROVIDER_SITE_OTHER): Payer: BC Managed Care – PPO | Admitting: Internal Medicine

## 2013-10-31 ENCOUNTER — Encounter: Payer: Self-pay | Admitting: Internal Medicine

## 2013-10-31 VITALS — BP 126/72 | HR 74 | Temp 98.0°F | Resp 16 | Ht 74.0 in | Wt 312.2 lb

## 2013-10-31 DIAGNOSIS — E291 Testicular hypofunction: Secondary | ICD-10-CM

## 2013-10-31 DIAGNOSIS — M170 Bilateral primary osteoarthritis of knee: Secondary | ICD-10-CM

## 2013-10-31 DIAGNOSIS — E119 Type 2 diabetes mellitus without complications: Secondary | ICD-10-CM

## 2013-10-31 DIAGNOSIS — E785 Hyperlipidemia, unspecified: Secondary | ICD-10-CM

## 2013-10-31 DIAGNOSIS — E1165 Type 2 diabetes mellitus with hyperglycemia: Secondary | ICD-10-CM

## 2013-10-31 DIAGNOSIS — E1129 Type 2 diabetes mellitus with other diabetic kidney complication: Secondary | ICD-10-CM

## 2013-10-31 DIAGNOSIS — I10 Essential (primary) hypertension: Secondary | ICD-10-CM

## 2013-10-31 DIAGNOSIS — E349 Endocrine disorder, unspecified: Secondary | ICD-10-CM

## 2013-10-31 DIAGNOSIS — IMO0002 Reserved for concepts with insufficient information to code with codable children: Secondary | ICD-10-CM

## 2013-10-31 DIAGNOSIS — Z23 Encounter for immunization: Secondary | ICD-10-CM

## 2013-10-31 LAB — CBC WITH DIFFERENTIAL/PLATELET
BASOS ABS: 0 10*3/uL (ref 0.0–0.1)
Basophils Relative: 0.3 % (ref 0.0–3.0)
Eosinophils Absolute: 0.2 10*3/uL (ref 0.0–0.7)
Eosinophils Relative: 2.2 % (ref 0.0–5.0)
HCT: 37.8 % — ABNORMAL LOW (ref 39.0–52.0)
Hemoglobin: 12.6 g/dL — ABNORMAL LOW (ref 13.0–17.0)
LYMPHS ABS: 1.7 10*3/uL (ref 0.7–4.0)
Lymphocytes Relative: 22.8 % (ref 12.0–46.0)
MCHC: 33.4 g/dL (ref 30.0–36.0)
MCV: 85.3 fl (ref 78.0–100.0)
MONO ABS: 0.8 10*3/uL (ref 0.1–1.0)
Monocytes Relative: 11.1 % (ref 3.0–12.0)
Neutro Abs: 4.8 10*3/uL (ref 1.4–7.7)
Neutrophils Relative %: 63.6 % (ref 43.0–77.0)
PLATELETS: 189 10*3/uL (ref 150.0–400.0)
RBC: 4.43 Mil/uL (ref 4.22–5.81)
RDW: 14.7 % (ref 11.5–15.5)
WBC: 7.5 10*3/uL (ref 4.0–10.5)

## 2013-10-31 LAB — MICROALBUMIN / CREATININE URINE RATIO
Creatinine,U: 267.3 mg/dL
Microalb Creat Ratio: 1.8 mg/g (ref 0.0–30.0)
Microalb, Ur: 4.8 mg/dL — ABNORMAL HIGH (ref 0.0–1.9)

## 2013-10-31 LAB — LIPID PANEL
CHOLESTEROL: 175 mg/dL (ref 0–200)
HDL: 37.7 mg/dL — ABNORMAL LOW (ref >39.00)
NONHDL: 137.3
Total CHOL/HDL Ratio: 5
Triglycerides: 234 mg/dL — ABNORMAL HIGH (ref 0.0–149.0)
VLDL: 46.8 mg/dL — ABNORMAL HIGH (ref 0.0–40.0)

## 2013-10-31 LAB — COMPREHENSIVE METABOLIC PANEL
ALK PHOS: 64 U/L (ref 39–117)
ALT: 46 U/L (ref 0–53)
AST: 32 U/L (ref 0–37)
Albumin: 3.8 g/dL (ref 3.5–5.2)
BILIRUBIN TOTAL: 0.8 mg/dL (ref 0.2–1.2)
BUN: 17 mg/dL (ref 6–23)
CO2: 18 meq/L — AB (ref 19–32)
Calcium: 9.1 mg/dL (ref 8.4–10.5)
Chloride: 105 mEq/L (ref 96–112)
Creatinine, Ser: 0.9 mg/dL (ref 0.4–1.5)
GFR: 89.38 mL/min (ref >60.00)
Glucose, Bld: 233 mg/dL — ABNORMAL HIGH (ref 70–99)
Potassium: 3.9 mEq/L (ref 3.5–5.1)
Sodium: 137 mEq/L (ref 135–145)
Total Protein: 7.3 g/dL (ref 6.0–8.3)

## 2013-10-31 LAB — HEMOGLOBIN A1C: Hgb A1c MFr Bld: 7.7 % — ABNORMAL HIGH (ref 4.6–6.5)

## 2013-10-31 LAB — HM DIABETES FOOT EXAM
HM Diabetic Foot Exam: NORMAL
HM Diabetic Foot Exam: NORMAL

## 2013-10-31 LAB — LDL CHOLESTEROL, DIRECT: Direct LDL: 101.2 mg/dL

## 2013-10-31 MED ORDER — MELOXICAM 15 MG PO TABS: 15.0000 mg | ORAL_TABLET | Freq: Every day | ORAL | Status: DC

## 2013-10-31 NOTE — Patient Instructions (Addendum)
I want you to lose 12 lbs by your next visit using diet and regular exercise  You received the peumonia and the flu vaccine today   Please review  my version of a  "Low GI"  Diet:   There may be some additional low cost foods that will help you stay low glycemic index .    All of the foods can be found at grocery stores and in bulk at [employer].  The Atkins protein bars and shakes are available in more varieties at [employer], [employer] and [employer].     7 AM Breakfast:  Choose from the following:  Low carbohydrate Protein  Shakes (I recommend the EAS AdvantEdge "Carb Control" shakes  Or the low carb shakes by Atkins.    2.5 carbs   Arnold's "Sandwhich Thin"toasted  w/ peanut butter (no jelly: about 20 net carbs  "Bagel Thin" with cream cheese and salmon: about 20 carbs   a scrambled egg/bacon/cheese burrito made with Mission's "carb balance" whole wheat tortilla  (about 10 net carbs )  A slice of home made fritatta (egg based dish without a crust:  google it)    Avoid cereal and bananas, oatmeal and cream of wheat and grits. They are loaded with carbohydrates!   10 AM: high protein snack  Protein bar by Atkins (the snack size, under 200 cal, usually < 6 net carbs).    A stick of cheese:  Around 1 carb,  100 cal     Dannon Light n Fit [place] Yogurt  (80 cal, 8 carbs)  Other so called "protein bars" and Greek yogurts tend to be loaded with carbohydrates.  Remember, in food advertising, the word "energy" is synonymous for " carbohydrate."  Lunch:   A Sandwich using the bread choices listed, Can use any  Eggs,  lunchmeat, grilled meat or canned tuna), avocado, regular mayo/mustard  and cheese.  A Salad using blue cheese, ranch,  Goddess or vinagrette,  No croutons or "confetti" and no "candied nuts" but regular nuts OK.   No pretzels or chips.  Pickles and miniature sweet peppers are a good low carb alternative that provide a "crunch"  The bread is the only source of carbohydrate in a sandwich  and  can be decreased by trying some of these alternatives to traditional loaf bread  Joseph's makes a pita bread and a flat bread that are 50 cal and 4 net carbs available at [place] and [place].  This can be toasted to use with hummous as well  Toufayan makes a low carb flatbread that's 100 cal and 9 net carbs available at [employer] and [employer]'s makes 2 sizes of  Low carb whole wheat tortilla  (The large one is 210 cal and 6 net carbs)  Flat Out makes flatbreads that are low carb as well  Avoid "Low fat dressings, as well as Barry Brunner and Braxton dressings They are loaded with sugar!   3 PM/ Mid day  Snack:  Consider  1 ounce of  almonds, walnuts, pistachios, pecans, peanuts,  Macadamia nuts or a nut medley.  Avoid "granola"; the dried cranberries and raisins are loaded with carbohydrates. Mixed nuts as long as there are no raisins,  cranberries or dried fruit.    Try the prosciutto/mozzarella cheese sticks by F[employer]ruci  In deli /backery section   High protein   To avoid overindulging in snacks: Try drinking a glass of unsweeted almond/coconut milk  Or a cup of coffee with  your Atkins chocolate bar to keep you from having 3!!!   Pork rinds!  Yes Pork Rinds        6 PM  Dinner:     Meat/fowl/fish with a green salad, and either broccoli, cauliflower, green beans, spinach, brussel sprouts or  Lima beans. DO NOT BREAD THE PROTEIN!!      There is a low carb pasta by Dreamfield's that is acceptable and tastes great: only 5 digestible carbs/serving.( All grocery stores but BJs carry it )  Try Hurley Cisco Angelo's chicken piccata or chicken or eggplant parm over low carb pasta.(Lowes and BJs)   Marjory Lies Sanchez's "Carnitas" (pulled pork, no sauce,  0 carbs) or his beef pot roast to make a dinner burrito (at BJ's)  Pesto over low carb pasta (bj's sells a good quality pesto in the center refrigerated section of the deli   Try satueeing  Cheral Marker with mushroooms  Whole wheat pasta is still full of  digestible carbs and  Not as low in glycemic index as Dreamfield's.   Brown rice is still rice,  So skip the rice and noodles if you eat [place] or [place] (or at least limit to 1/2 cup)  9 PM snack :   Breyer's "low carb" fudgsicle or  ice cream bar (Carb Smart line), or  Weight Watcher's ice cream bar , or another "no sugar added" ice cream;  a serving of fresh berries/cherries with whipped cream   Cheese or DANNON'S LlGHT N FIT GREEK YOGURT or the Oikos greek yogurt   8 ounces of [place] unsweetened almond/cococunut milk    Avoid bananas, pineapple, grapes  and watermelon on a regular basis because they are high in sugar.  THINK OF THEM AS DESSERT  Remember that snack Substitutions should be less than 10 NET carbs per serving and meals < 20 carbs. Remember to subtract fiber grams to get the "net carbs."

## 2013-10-31 NOTE — Progress Notes (Signed)
Patient ID: David Pineda, male   DOB: 1955-11-18, 58 y.o.   MRN: [id number]   Patient Active Problem List   Diagnosis Date Noted  . Anxiety state, unspecified 07/25/2013  . Chronic eustachian tube dysfunction 05/18/2013  . Special screening for malignant neoplasm of prostate 11/27/2012  . 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 Preventative health care 11/02/2012  . Chest pain, unspecified 11/01/2012  . History of CVA (cerebrovascular accident) 02/12/2012  . DJD (degenerative joint disease) of knee 02/12/2012  . OSA on CPAP   . Obesity, morbid, BMI 40.0-49.9   . DM type 2, uncontrolled, with renal complications   . Hyperlipidemia   . Hypotestosteronism   . Hypertension 11/08/2011    Subjective:  CC:   Chief Complaint  Patient presents with  . Follow-up    3 month. Knee pain causing hip to hurt.  . Diabetes    HPI:   David Pineda is a 58 y.o. male who presents for   3 month follow up on DM type 2, hypertension,  Obesity ,  OSA on CPAP and hyperlipidemia.    He has not been checking his blood sugars but has been taking his medications.   Both knees aching,  Has gained weight, not exercising . Couldn't walk 5 blocks without a lot of pain  Not taking any NSAIDs.  Inquiring about a [place] .    Has been having a dry cough after catching a viral URI from son a few months ago,  Denies reflux,     Past Medical History  Diagnosis Date  . Arthritis   . Elevated blood pressure reading   . OSA on CPAP   . Obesity, morbid, BMI 40.0-49.9   . Diabetes mellitus without complication   . Hypertension   . Hyperlipidemia   . Hypotestosteronism     Past Surgical History  Procedure Laterality Date  . Foot surgery      left        The following portions of the patient's history were reviewed and updated as appropriate: Allergies, current medications, and problem list.    Review of Systems:   Patient denies headache, fevers, malaise, unintentional  weight loss, skin rash, eye pain, sinus congestion and sinus pain, sore throat, dysphagia,  hemoptysis , cough, dyspnea, wheezing, chest pain, palpitations, orthopnea, edema, abdominal pain, nausea, melena, diarrhea, constipation, flank pain, dysuria, hematuria, urinary  Frequency, nocturia, numbness, tingling, seizures,  Focal weakness, Loss of consciousness,  Tremor, insomnia, depression, anxiety, and suicidal ideation.     History   Social History  . Marital Status: Divorced    Spouse Name: N/A    Number of Children: N/A  . Years of Education: N/A   Occupational History  . Not on file.   Social History Main Topics  . Smoking status: Former Smoker -- 3.00 packs/day for 7 years    Types: Cigarettes    Quit date: 11/17/1994  . Smokeless tobacco: Not on file  . Alcohol Use: Yes  . Drug Use: No  . Sexual Activity: Not on file   Other Topics Concern  . Not on file   Social History Narrative  . No narrative on file    Objective:  Filed Vitals:   10/31/13 0807  BP: 126/72  Pulse: 74  Temp: 98 F (36.7 C)  Resp: 16     General appearance: alert, cooperative and appears stated age Ears: normal TM's and external ear canals both ears Throat: lips, mucosa, and tongue  normal; teeth and gums normal Neck: no adenopathy, no carotid bruit, supple, symmetrical, trachea midline and thyroid not enlarged, symmetric, no tenderness/mass/nodules Back: symmetric, no curvature. ROM normal. No CVA tenderness. Lungs: clear to auscultation bilaterally Heart: regular rate and rhythm, S1, S2 normal, no murmur, click, rub or gallop Abdomen: soft, non-tender; bowel sounds normal; no masses,  no organomegaly Pulses: 2+ and symmetric Skin: Skin color, texture, turgor normal. No rashes or lesions Lymph nodes: Cervical, supraclavicular, and axillary nodes normal.  Assessment and Plan:  DM type 2, uncontrolled, with renal complications Loss of control noted by repeat A1c.  He is maxed out on two  meds,  Adding [place]. Reminder for annual diabetic eye exam given..  Foot exam done. Meds reviewed and she is on a baby aspirin daily., a statin and an ACE inhibitor for mild proteinuria Lab Results  Component Value Date   HGBA1C 7.7* 10/31/2013   Lab Results  Component Value Date   MICROALBUR 4.8* 10/31/2013     DJD (degenerative joint disease) of knee Weight loss encouraged, continue meloxicam and tramadol prn.   Hyperlipidemia Tolerating  pravastatin . HDL is low ,  trigs elevated and LDL < 100.  Low glycemic index diet advised.  Consider adding fenofibrate if still elevated in 3 months    Lab Results  Component Value Date   CHOL 175 10/31/2013   HDL 37.70* 10/31/2013   LDLCALC 85 07/25/2013   LDLDIRECT 101.2 10/31/2013   TRIG 234.0* 10/31/2013   CHOLHDL 5 10/31/2013     Hypotestosteronism Resuming testosterone replacement.    Lab Results  Component Value Date   TESTOSTERONE 128* 10/31/2013      Updated Medication List Outpatient Encounter Prescriptions as of 10/31/2013  Medication Sig  . ALPRAZolam (XANAX) 0.5 MG tablet Take 1 tablet (0.5 mg total) by mouth at bedtime as needed for anxiety.  Marland Kitchen amLODipine (NORVASC) 10 MG tablet Take 1 tablet (10 mg total) by mouth daily.  . Ascorbic Acid (VITAMIN C) 100 MG tablet Take 100 mg by mouth daily.  Marland Kitchen aspirin 325 MG tablet Take 325 mg by mouth daily.  . Azelastine-Fluticasone (DYMISTA) 137-50 MCG/ACT SUSP Place 2 Squirts into the nose daily. In each nostril  . fish oil-omega-3 fatty acids 1000 MG capsule Take 2 g by mouth daily.  Marland Kitchen glipiZIDE (GLUCOTROL) 10 MG tablet TAKE ONE TABLET BY MOUTH TWICE DAILY BEFORE MEAL(S)  . glucosamine-chondroitin 500-400 MG tablet Take 1 tablet by mouth 3 (three) times daily.  . INS SYRINGE/NEEDLE 1CC/28G (B-D INSULIN SYRINGE 1CC/28G) 28G X 1/2" 1 ML MISC 1 Syringe by Does not apply route daily after supper.  Marland Kitchen lisinopril (PRINIVIL,ZESTRIL) 10 MG tablet Take 1 tablet (10 mg total) by mouth  daily.  . meloxicam (MOBIC) 15 MG tablet Take 1 tablet (15 mg total) by mouth daily.  . metFORMIN (GLUCOPHAGE) 500 MG tablet Take 1 tablet (500 mg total) by mouth 2 (two) times daily with a meal.  . Multiple Vitamins-Minerals (MULTIVITAMIN WITH MINERALS) tablet Take 1 tablet by mouth daily.  . pravastatin (PRAVACHOL) 40 MG tablet Take 1 tablet (40 mg total) by mouth daily.  . sildenafil (VIAGRA) 100 MG tablet Take 1 tablet (100 mg total) by mouth daily as needed for erectile dysfunction.  . sitaGLIPtin (JANUVIA) 100 MG tablet Take 1 tablet (100 mg total) by mouth daily.  Marland Kitchen testosterone cypionate (DEPOTESTOTERONE CYPIONATE) 200 MG/ML injection Inject 1 mL (200 mg total) into the muscle every 14 (fourteen) days.  . traMADol (ULTRAM) 50 MG  tablet TAKE ONE TABLET BY MOUTH EVERY 6 HOURS AS NEEDED FOR PAIN     Orders Placed This Encounter  Procedures  . Pneumococcal conjugate vaccine 13-valent  . HM DIABETES FOOT EXAM    No Follow-up on file.

## 2013-10-31 NOTE — Progress Notes (Signed)
Pre-visit discussion using our clinic review tool. No additional management support is needed unless otherwise documented below in the visit note.  

## 2013-11-01 LAB — TSH: TSH: 0.83 u[IU]/mL (ref 0.35–4.50)

## 2013-11-01 LAB — TESTOSTERONE, FREE, TOTAL, SHBG
Sex Hormone Binding: 11 nmol/L — ABNORMAL LOW (ref 13–71)
Testosterone, Free: 38.6 pg/mL — ABNORMAL LOW (ref 47.0–244.0)
Testosterone-% Free: 3 % — ABNORMAL HIGH (ref 1.6–2.9)
Testosterone: 128 ng/dL — ABNORMAL LOW (ref 300–890)

## 2013-11-02 ENCOUNTER — Encounter: Payer: Self-pay | Admitting: Internal Medicine

## 2013-11-02 MED ORDER — SITAGLIPTIN PHOSPHATE 100 MG PO TABS: 100.0000 mg | ORAL_TABLET | Freq: Every day | ORAL | Status: DC

## 2013-11-02 NOTE — Assessment & Plan Note (Addendum)
Loss of control noted by repeat A1c.  He is maxed out on two meds,  Adding [place]. Reminder for annual diabetic eye exam given..  Foot exam done. Meds reviewed and she is on a baby aspirin daily., a statin and an ACE inhibitor for mild proteinuria Lab Results  Component Value Date   HGBA1C 7.7* 10/31/2013   Lab Results  Component Value Date   MICROALBUR 4.8* 10/31/2013

## 2013-11-02 NOTE — Assessment & Plan Note (Signed)
Weight loss encouraged, continue meloxicam and tramadol prn.

## 2013-11-02 NOTE — Assessment & Plan Note (Signed)
Tolerating  pravastatin . HDL is low ,  trigs elevated and LDL < 100.  Low glycemic index diet advised.  Consider adding fenofibrate if still elevated in 3 months    Lab Results  Component Value Date   CHOL 175 10/31/2013   HDL 37.70* 10/31/2013   LDLCALC 85 07/25/2013   LDLDIRECT 101.2 10/31/2013   TRIG 234.0* 10/31/2013   CHOLHDL 5 10/31/2013

## 2013-11-02 NOTE — Assessment & Plan Note (Signed)
Resuming testosterone replacement.    Lab Results  Component Value Date   TESTOSTERONE 128* 10/31/2013

## 2013-11-03 ENCOUNTER — Encounter: Payer: Self-pay | Admitting: Internal Medicine

## 2013-11-04 ENCOUNTER — Telehealth: Payer: Self-pay | Admitting: Internal Medicine

## 2013-11-04 NOTE — Telephone Encounter (Signed)
Spoke with pt, advised the bill was from [facility] for pain medication management.  He states he received a check from [place] last month or so for $950 with no explanation.   He thought it was a refund for him.  He states when he received this bill it indicated the check from insurance was to be cosigned by him and forwarded to them.  He further states he was not made aware of that prior to spending the money.

## 2013-11-04 NOTE — Telephone Encounter (Signed)
David Pineda called inquiring about a bill he received. When he called the billing dept he was told "they had no idea what he was referring to and that he needs to speak with David Pineda." He received a bill for $950 for Drug Screening etc. He said he didn't have a Drug Screen and is unsure what it's talking about. On the bill states, "Referred By: David Pineda" and Acting Provider: Gerda Diss. Arocho. [name] states he's never seen a Dr. Purcell Mouton and is very confused. On the bill was "chart # 13593". If possible, he'd like someone to call him back in regards to this. Pt ph# [phone] Thank you.

## 2013-11-26 NOTE — Telephone Encounter (Signed)
Mailed unread message to pt  

## 2013-12-11 ENCOUNTER — Encounter: Payer: Self-pay | Admitting: Internal Medicine

## 2013-12-23 LAB — HM DIABETES EYE EXAM

## 2014-01-04 ENCOUNTER — Other Ambulatory Visit: Payer: Self-pay | Admitting: Cardiovascular Disease

## 2014-02-27 ENCOUNTER — Other Ambulatory Visit: Payer: Self-pay | Admitting: Internal Medicine

## 2014-03-24 ENCOUNTER — Encounter: Payer: Self-pay | Admitting: Internal Medicine

## 2014-03-24 ENCOUNTER — Ambulatory Visit (INDEPENDENT_AMBULATORY_CARE_PROVIDER_SITE_OTHER): Payer: BLUE CROSS/BLUE SHIELD | Admitting: Internal Medicine

## 2014-03-24 DIAGNOSIS — I10 Essential (primary) hypertension: Secondary | ICD-10-CM

## 2014-03-24 DIAGNOSIS — E785 Hyperlipidemia, unspecified: Secondary | ICD-10-CM

## 2014-03-24 DIAGNOSIS — E291 Testicular hypofunction: Secondary | ICD-10-CM

## 2014-03-24 DIAGNOSIS — E1165 Type 2 diabetes mellitus with hyperglycemia: Secondary | ICD-10-CM

## 2014-03-24 DIAGNOSIS — E1129 Type 2 diabetes mellitus with other diabetic kidney complication: Secondary | ICD-10-CM | POA: Diagnosis not present

## 2014-03-24 DIAGNOSIS — E349 Endocrine disorder, unspecified: Secondary | ICD-10-CM

## 2014-03-24 DIAGNOSIS — IMO0002 Reserved for concepts with insufficient information to code with codable children: Secondary | ICD-10-CM

## 2014-03-24 LAB — COMPREHENSIVE METABOLIC PANEL
ALBUMIN: 4.8 g/dL (ref 3.5–5.2)
ALT: 32 U/L (ref 0–53)
AST: 24 U/L (ref 0–37)
Alkaline Phosphatase: 58 U/L (ref 39–117)
BILIRUBIN TOTAL: 0.6 mg/dL (ref 0.2–1.2)
BUN: 16 mg/dL (ref 6–23)
CO2: 27 meq/L (ref 19–32)
Calcium: 10.1 mg/dL (ref 8.4–10.5)
Chloride: 101 mEq/L (ref 96–112)
Creatinine, Ser: 0.86 mg/dL (ref 0.40–1.50)
GFR: 97.69 mL/min (ref >60.00)
Glucose, Bld: 184 mg/dL — ABNORMAL HIGH (ref 70–99)
Potassium: 4.2 mEq/L (ref 3.5–5.1)
SODIUM: 134 meq/L — AB (ref 135–145)
TOTAL PROTEIN: 7.5 g/dL (ref 6.0–8.3)

## 2014-03-24 LAB — LIPID PANEL
CHOLESTEROL: 203 mg/dL — AB (ref 0–200)
HDL: 41.2 mg/dL (ref >39.00)
NONHDL: 161.8
Total CHOL/HDL Ratio: 5
Triglycerides: 289 mg/dL — ABNORMAL HIGH (ref 0.0–149.0)
VLDL: 57.8 mg/dL — AB (ref 0.0–40.0)

## 2014-03-24 LAB — HEMOGLOBIN A1C: Hgb A1c MFr Bld: 7.2 % — ABNORMAL HIGH (ref 4.6–6.5)

## 2014-03-24 LAB — HM DIABETES FOOT EXAM: HM DIABETIC FOOT EXAM: NORMAL

## 2014-03-24 LAB — LDL CHOLESTEROL, DIRECT: LDL DIRECT: 124 mg/dL

## 2014-03-24 MED ORDER — TESTOSTERONE CYPIONATE 200 MG/ML IM SOLN: 200.0000 mg | INTRAMUSCULAR | Status: DC

## 2014-03-24 MED ORDER — LISINOPRIL 20 MG PO TABS: 20.0000 mg | ORAL_TABLET | Freq: Every day | ORAL | Status: DC

## 2014-03-24 NOTE — Assessment & Plan Note (Addendum)
I have addressed  BMI and recommended closer adherence to a low glycemic index diet utilizing smaller more frequent meals to increase metabolism.  I have also recommended that patient increase his  Exercise frequency  with a goal of 30 minutes of aerobic exercise a minimum of 5 days per week.

## 2014-03-24 NOTE — Progress Notes (Signed)
Patient ID: David Pineda, male   DOB: [birth year], 57 y.o.   MRN: [id number]  Patient Active Problem List   Diagnosis Date Noted  . Anxiety state, unspecified 07/25/2013  . Chronic eustachian tube dysfunction 05/18/2013  . Special screening for malignant neoplasm of prostate 11/27/2012  . 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 Preventative health care 11/02/2012  . Chest pain, unspecified 11/01/2012  . History of CVA (cerebrovascular accident) 02/12/2012  . DJD (degenerative joint disease) of knee 02/12/2012  . OSA on CPAP   . Obesity, morbid, BMI 40.0-49.9   . DM type 2, uncontrolled, with renal complications   . Hyperlipidemia   . Hypotestosteronism   . Hypertension 11/08/2011    Subjective:  CC:   Chief Complaint  Patient presents with  . Follow-up  . Diabetes    HPI:   David Pineda is a 59 y.o. male who presents for   Follow up on type 2 DM, hypertension,  Hypotestosteronism,  hyperlipidmeia and obesity. He was last seen in October  Labs at that time as below:  Lab Results  Component Value Date   HGBA1C 7.7* 10/31/2013   Lab Results  Component Value Date   MICROALBUR 4.8* 10/31/2013   Lab Results  Component Value Date   CHOL 175 10/31/2013   HDL 37.70* 10/31/2013   LDLCALC 85 07/25/2013   LDLDIRECT 101.2 10/31/2013   TRIG 234.0* 10/31/2013   CHOLHDL 5 10/31/2013   He has not been taking insulin, but has been taking oral meds for the most part on a regular basis.  Has been checking blood sugars sporadically  Only 3 or 4 times  Per week and his  fastings have been 190.  Had an apple and coffee this morning.  Not using the insulin pens I gave him .  taking glipizde and [place] as directed but sometimes at the end of the day,  Doing some exercise at the gym once or twice per week  for 30 minutes .  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  Some knee pain aggravated by power washing ,  Done every 2 weeks.   Wrists hurt ,    Left hip stiffness and pain with inactivity,  Improves with activity  Uses aleve and tramadol prn    Past Medical History  Diagnosis Date  . Arthritis   . Elevated blood pressure reading   . OSA on CPAP   . Obesity, morbid, BMI 40.0-49.9   . Diabetes mellitus without complication   . Hypertension   . Hyperlipidemia   . Hypotestosteronism     Past Surgical History  Procedure Laterality Date  . Foot surgery      left        The following portions of the patient's history were reviewed and updated as appropriate: Allergies, current medications, and problem list.    Review of Systems:   Patient denies headache, fevers, malaise, unintentional weight loss, skin rash, eye pain, sinus congestion and sinus pain, sore throat, dysphagia,  hemoptysis , cough, dyspnea, wheezing, chest pain, palpitations, orthopnea, edema, abdominal pain, nausea, melena, diarrhea, constipation, flank pain, dysuria, hematuria, urinary  Frequency, nocturia, numbness, tingling, seizures,  Focal weakness, Loss of consciousness,  Tremor, insomnia, depression, anxiety, and suicidal ideation.     History   Social History  . Marital Status: Divorced    Spouse Name: N/A  . Number of Children: N/A  . Years of Education: N/A   Occupational History  .  Not on file.   Social History Main Topics  . Smoking status: Former Smoker -- 3.00 packs/day for 7 years    Types: Cigarettes    Quit date: 11/17/1994  . Smokeless tobacco: Not on file  . Alcohol Use: Yes  . Drug Use: No  . Sexual Activity: Not on file   Other Topics Concern  . Not on file   Social History Narrative    Objective:  Filed Vitals:   03/24/14 0945  BP: 138/80  Pulse: 76  Temp: 97.9 F (36.6 C)  Resp: 16     General appearance: alert, cooperative and appears stated age Ears: normal TM's and external ear canals both ears Throat: lips, mucosa, and tongue normal; teeth and gums normal Neck: no adenopathy, no  carotid bruit, supple, symmetrical, trachea midline and thyroid not enlarged, symmetric, no tenderness/mass/nodules Back: symmetric, no curvature. ROM normal. No CVA tenderness. Lungs: clear to auscultation bilaterally Heart: regular rate and rhythm, S1, S2 normal, no murmur, click, rub or gallop Abdomen: soft, non-tender; bowel sounds normal; no masses,  no organomegaly Pulses: 2+ and symmetric Skin: Skin color, texture, turgor normal. No rashes or lesions Lymph nodes: Cervical, supraclavicular, and axillary nodes normal.  Assessment and Plan:  Obesity, morbid, BMI 40.0-49.9 I have addressed  BMI and recommended closer adherence to a low glycemic index diet utilizing smaller more frequent meals to increase metabolism.  I have also recommended that patient increase his  Exercise frequency  with a goal of 30 minutes of aerobic exercise a minimum of 5 days per week.    Hypotestosteronism Managed with twice weekly.  Missed last dose,     Hypertension Not wll controlled on current regimen. Will increase lisinopril to 20 mg daily.  Renal function stable.  Lab Results  Component Value Date   CREATININE 0.86 03/24/2014   Lab Results  Component Value Date   NA 134* 03/24/2014   K 4.2 03/24/2014   CL 101 03/24/2014   CO2 27 03/24/2014       DM type 2, uncontrolled, with renal complications Improved  control noted by repeat A1c.  He is maxed out on three medications since adding  [place]. Reminder for annual diabetic eye exam given..  Foot exam done. Meds reviewed and he is on a baby aspirin daily., a statin and an ACE inhibitor for mild proteinuria Lab Results  Component Value Date   HGBA1C 7.2* 03/24/2014   Lab Results  Component Value Date   MICROALBUR 4.8* 10/31/2013        Hyperlipidemia Tolerating  pravastatin . HDL is improved,  trigs elevated and calculated. LDL < 100.  Low glycemic index diet advised.  Consider adding fenofibrate if still elevated in 3 months     Lab Results  Component Value Date   CHOL 203* 03/24/2014   HDL 41.20 03/24/2014   LDLCALC 85 07/25/2013   LDLDIRECT 124.0 03/24/2014   TRIG 289.0* 03/24/2014   CHOLHDL 5 03/24/2014         Updated Medication List Outpatient Encounter Prescriptions as of 03/24/2014  Medication Sig  . amLODipine (NORVASC) 10 MG tablet TAKE ONE TABLET BY MOUTH ONCE DAILY  . Ascorbic Acid (VITAMIN C) 100 MG tablet Take 100 mg by mouth daily.  Marland Kitchen aspirin 325 MG tablet Take 325 mg by mouth daily.  . fish oil-omega-3 fatty acids 1000 MG capsule Take 2 g by mouth daily.  Marland Kitchen glipiZIDE (GLUCOTROL) 10 MG tablet TAKE ONE TABLET BY MOUTH TWICE DAILY BEFORE MEAL(S)  .  glucosamine-chondroitin 500-400 MG tablet Take 1 tablet by mouth 3 (three) times daily.  . INS SYRINGE/NEEDLE 1CC/28G (B-D INSULIN SYRINGE 1CC/28G) 28G X 1/2" 1 ML MISC 1 Syringe by Does not apply route daily after supper.  Marland Kitchen lisinopril (PRINIVIL,ZESTRIL) 20 MG tablet Take 1 tablet (20 mg total) by mouth daily.  . meloxicam (MOBIC) 15 MG tablet Take 1 tablet (15 mg total) by mouth daily.  . metFORMIN (GLUCOPHAGE) 500 MG tablet Take 1 tablet (500 mg total) by mouth 2 (two) times daily with a meal.  . Multiple Vitamins-Minerals (MULTIVITAMIN WITH MINERALS) tablet Take 1 tablet by mouth daily.  . pravastatin (PRAVACHOL) 40 MG tablet TAKE ONE TABLET BY MOUTH ONCE DAILY  . sildenafil (VIAGRA) 100 MG tablet Take 1 tablet (100 mg total) by mouth daily as needed for erectile dysfunction.  . sitaGLIPtin (JANUVIA) 100 MG tablet Take 1 tablet (100 mg total) by mouth daily.  Marland Kitchen testosterone cypionate (DEPOTESTOTERONE CYPIONATE) 200 MG/ML injection Inject 1 mL (200 mg total) into the muscle every 14 (fourteen) days.  . traMADol (ULTRAM) 50 MG tablet TAKE ONE TABLET BY MOUTH EVERY 6 HOURS AS NEEDED FOR PAIN  . [DISCONTINUED] lisinopril (PRINIVIL,ZESTRIL) 10 MG tablet Take 1 tablet (10 mg total) by mouth daily.  . [DISCONTINUED] testosterone cypionate  (DEPOTESTOTERONE CYPIONATE) 200 MG/ML injection Inject 1 mL (200 mg total) into the muscle every 14 (fourteen) days.  . [DISCONTINUED] testosterone cypionate (DEPOTESTOTERONE CYPIONATE) 200 MG/ML injection Inject 1 mL (200 mg total) into the muscle every 14 (fourteen) days.  . ALPRAZolam (XANAX) 0.5 MG tablet Take 1 tablet (0.5 mg total) by mouth at bedtime as needed for anxiety. (Patient not taking: Reported on 03/24/2014)  . Azelastine-Fluticasone (DYMISTA) 137-50 MCG/ACT SUSP Place 2 Squirts into the nose daily. In each nostril (Patient not taking: Reported on 03/24/2014)     Orders Placed This Encounter  Procedures  . Lipid panel  . Hemoglobin A1c  . Comprehensive metabolic panel  . LDL cholesterol, direct  . HM DIABETES EYE EXAM  . HM DIABETES FOOT EXAM    Return in about 3 months (around 06/24/2014) for follow up diabetes.

## 2014-03-24 NOTE — Progress Notes (Signed)
Pre-visit discussion using our clinic review tool. No additional management support is needed unless otherwise documented below in the visit note.  

## 2014-03-24 NOTE — Patient Instructions (Signed)
I have increased your lisinopril dose to 20 mg daily for your blood pressure  You will proabably needs to start bedtime insulin unless your A1c is < 7.0 today  i will contact you instructions

## 2014-03-24 NOTE — Assessment & Plan Note (Signed)
Managed with twice weekly.  Missed last dose,

## 2014-03-25 ENCOUNTER — Encounter: Payer: Self-pay | Admitting: Internal Medicine

## 2014-03-25 NOTE — Assessment & Plan Note (Signed)
Tolerating  pravastatin . HDL is improved,  trigs elevated and calculated. LDL < 100.  Low glycemic index diet advised.  Consider adding fenofibrate if still elevated in 3 months    Lab Results  Component Value Date   CHOL 203* 03/24/2014   HDL 41.20 03/24/2014   LDLCALC 85 07/25/2013   LDLDIRECT 124.0 03/24/2014   TRIG 289.0* 03/24/2014   CHOLHDL 5 03/24/2014

## 2014-03-25 NOTE — Assessment & Plan Note (Signed)
Improved  control noted by repeat A1c.  He is maxed out on three medications since adding  [place]. Reminder for annual diabetic eye exam given..  Foot exam done. Meds reviewed and he is on a baby aspirin daily., a statin and an ACE inhibitor for mild proteinuria Lab Results  Component Value Date   HGBA1C 7.2* 03/24/2014   Lab Results  Component Value Date   MICROALBUR 4.8* 10/31/2013

## 2014-03-25 NOTE — Assessment & Plan Note (Signed)
Not wll controlled on current regimen. Will increase lisinopril to 20 mg daily.  Renal function stable.  Lab Results  Component Value Date   CREATININE 0.86 03/24/2014   Lab Results  Component Value Date   NA 134* 03/24/2014   K 4.2 03/24/2014   CL 101 03/24/2014   CO2 27 03/24/2014

## 2014-04-01 ENCOUNTER — Encounter: Payer: Self-pay | Admitting: Internal Medicine

## 2014-05-30 ENCOUNTER — Other Ambulatory Visit: Payer: Self-pay | Admitting: Internal Medicine

## 2014-05-30 ENCOUNTER — Other Ambulatory Visit: Payer: Self-pay | Admitting: *Deleted

## 2014-05-30 MED ORDER — METFORMIN HCL 500 MG PO TABS: 500.0000 mg | ORAL_TABLET | Freq: Two times a day (BID) | ORAL | Status: DC

## 2014-05-30 NOTE — Telephone Encounter (Signed)
90 day supply authorized and sent   

## 2014-05-30 NOTE — Telephone Encounter (Signed)
Last visit 03/24/14, ok refill?

## 2014-06-20 ENCOUNTER — Ambulatory Visit (INDEPENDENT_AMBULATORY_CARE_PROVIDER_SITE_OTHER): Payer: BLUE CROSS/BLUE SHIELD | Admitting: Nurse Practitioner

## 2014-06-20 VITALS — BP 152/76 | HR 71 | Temp 97.8°F | Resp 18 | Ht 76.0 in | Wt 308.0 lb

## 2014-06-20 DIAGNOSIS — R21 Rash and other nonspecific skin eruption: Secondary | ICD-10-CM

## 2014-06-20 NOTE — Patient Instructions (Signed)
Follow up with Dr. Derrel Nip next week at your appointment.

## 2014-06-20 NOTE — Progress Notes (Signed)
   Subjective:    Patient ID: David Pineda, male    DOB: Sep 04, 1955, 59 y.o.   MRN: [id number]  HPI  David Pineda is a 59 yo male with a CC of lower right back tenderness x 1.5 weeks.   1) 1.5 weeks of a band of pain from the right side of back around L 3-4 and goes to front stops midline.   Tried pain medication for knee and it helped somewhat  Denies rashes  Itches and slightly tender  Went to [facility] and had a bad bed, which he felt started this Tramadol somewhat helpful   Doesn't bother him in the morning until sitting up  Fluctuating pain Mild pain, dull/ache   Feels it is "skin deep"   Review of Systems  Constitutional: Negative for fever, chills, diaphoresis and fatigue.  Eyes: Negative for visual disturbance.  Respiratory: Negative for chest tightness, shortness of breath and wheezing.   Cardiovascular: Negative for chest pain, palpitations and leg swelling.  Neurological: Negative for dizziness, weakness and numbness.  Psychiatric/Behavioral: The patient is not nervous/anxious.       Objective:   Physical Exam  Constitutional:  BP 152/76 mmHg  Pulse 71  Temp(Src) 97.8 F (36.6 C)  Resp 18  Ht 6\' 4"  (1.93 m)  Wt 308 lb (139.708 kg)  BMI 37.51 kg/m2  SpO2 98%   Skin:     2 red 1.5 cm spots that look to be bug bites on low back. This is the area that is pruritic per the pt.        Assessment & Plan:

## 2014-06-20 NOTE — Progress Notes (Signed)
Pre visit review using our clinic review tool, if applicable. No additional management support is needed unless otherwise documented below in the visit note. 

## 2014-06-25 ENCOUNTER — Ambulatory Visit: Payer: BLUE CROSS/BLUE SHIELD | Admitting: Internal Medicine

## 2014-06-29 ENCOUNTER — Encounter: Payer: Self-pay | Admitting: Nurse Practitioner

## 2014-06-29 DIAGNOSIS — Z8619 Personal history of other infectious and parasitic diseases: Secondary | ICD-10-CM | POA: Insufficient documentation

## 2014-06-29 NOTE — Assessment & Plan Note (Signed)
Probable early shingles. Time frame too late for anti-virals. Asked pt to let us know if it worsens and he needs help with pain.

## 2014-07-02 ENCOUNTER — Ambulatory Visit: Payer: Self-pay | Admitting: Internal Medicine

## 2014-07-04 ENCOUNTER — Ambulatory Visit (INDEPENDENT_AMBULATORY_CARE_PROVIDER_SITE_OTHER): Payer: BLUE CROSS/BLUE SHIELD | Admitting: Internal Medicine

## 2014-07-04 ENCOUNTER — Encounter: Payer: Self-pay | Admitting: Internal Medicine

## 2014-07-04 VITALS — BP 140/76 | HR 78 | Temp 98.3°F | Resp 14 | Ht 76.0 in | Wt 309.8 lb

## 2014-07-04 DIAGNOSIS — E785 Hyperlipidemia, unspecified: Secondary | ICD-10-CM | POA: Diagnosis not present

## 2014-07-04 DIAGNOSIS — E1129 Type 2 diabetes mellitus with other diabetic kidney complication: Secondary | ICD-10-CM | POA: Diagnosis not present

## 2014-07-04 DIAGNOSIS — IMO0002 Reserved for concepts with insufficient information to code with codable children: Secondary | ICD-10-CM

## 2014-07-04 DIAGNOSIS — E1165 Type 2 diabetes mellitus with hyperglycemia: Secondary | ICD-10-CM | POA: Diagnosis not present

## 2014-07-04 DIAGNOSIS — I10 Essential (primary) hypertension: Secondary | ICD-10-CM

## 2014-07-04 DIAGNOSIS — Z8619 Personal history of other infectious and parasitic diseases: Secondary | ICD-10-CM

## 2014-07-04 DIAGNOSIS — Z1211 Encounter for screening for malignant neoplasm of colon: Secondary | ICD-10-CM

## 2014-07-04 DIAGNOSIS — E1140 Type 2 diabetes mellitus with diabetic neuropathy, unspecified: Secondary | ICD-10-CM

## 2014-07-04 DIAGNOSIS — E119 Type 2 diabetes mellitus without complications: Secondary | ICD-10-CM

## 2014-07-04 DIAGNOSIS — Z86010 Personal history of colonic polyps: Secondary | ICD-10-CM

## 2014-07-04 DIAGNOSIS — B351 Tinea unguium: Secondary | ICD-10-CM

## 2014-07-04 LAB — COMPREHENSIVE METABOLIC PANEL
ALK PHOS: 74 U/L (ref 39–117)
ALT: 34 U/L (ref 0–53)
AST: 20 U/L (ref 0–37)
Albumin: 4.4 g/dL (ref 3.5–5.2)
BUN: 18 mg/dL (ref 6–23)
CO2: 25 mEq/L (ref 19–32)
Calcium: 9.8 mg/dL (ref 8.4–10.5)
Chloride: 105 mEq/L (ref 96–112)
Creatinine, Ser: 0.87 mg/dL (ref 0.40–1.50)
GFR: 96.3 mL/min (ref >60.00)
Glucose, Bld: 250 mg/dL — ABNORMAL HIGH (ref 70–99)
POTASSIUM: 4.2 meq/L (ref 3.5–5.1)
Sodium: 139 mEq/L (ref 135–145)
Total Bilirubin: 0.5 mg/dL (ref 0.2–1.2)
Total Protein: 7.1 g/dL (ref 6.0–8.3)

## 2014-07-04 LAB — LIPID PANEL
CHOLESTEROL: 153 mg/dL (ref 0–200)
HDL: 39.4 mg/dL (ref >39.00)
LDL Cholesterol: 74 mg/dL (ref 0–99)
NonHDL: 113.6
TRIGLYCERIDES: 198 mg/dL — AB (ref 0.0–149.0)
Total CHOL/HDL Ratio: 4
VLDL: 39.6 mg/dL (ref 0.0–40.0)

## 2014-07-04 LAB — MICROALBUMIN / CREATININE URINE RATIO
CREATININE, U: 110.7 mg/dL
MICROALB UR: 2.4 mg/dL — AB (ref 0.0–1.9)
MICROALB/CREAT RATIO: 2.2 mg/g (ref 0.0–30.0)

## 2014-07-04 LAB — LDL CHOLESTEROL, DIRECT: Direct LDL: 84 mg/dL

## 2014-07-04 LAB — HEMOGLOBIN A1C: Hgb A1c MFr Bld: 6.7 % — ABNORMAL HIGH (ref 4.6–6.5)

## 2014-07-04 MED ORDER — LISINOPRIL-HYDROCHLOROTHIAZIDE 20-12.5 MG PO TABS: 1.0000 | ORAL_TABLET | Freq: Every day | ORAL | Status: DC

## 2014-07-04 NOTE — Patient Instructions (Signed)
I have changed your lisinopril to include a small dose of HCTZ, to lower your blood pressure to 130/80 (goal)  I want you to lose 12 lbs by your next visit   [place] with insurance about the Zostavax vaccine (shingles)

## 2014-07-04 NOTE — Progress Notes (Addendum)
Subjective:  Patient ID: David Pineda, male    DOB: 05/31/1955  Age: 59 y.o. MRN: [id number]  CC: The primary encounter diagnosis was DM type 2, uncontrolled, with renal complications. Diagnoses of Essential hypertension, Hyperlipidemia, Obesity, morbid, BMI 40.0-49.9, History of colonic polyps, Diabetes mellitus without complication, History of shingles, Screening for colon cancer, Onychomycosis of toenail, and Type 2 diabetes mellitus with diabetic neuropathy were also pertinent to this visit.  HPI David Pineda presents for follow up on Type 2 DM , hyperlipidemia, and obesity.    Saw NP 5 weeks ago for painful rash on his back radiating to right side, treated for a bug bite since he had stayed in a [place] in [facility] prior to the onset of symptoms. However afterward his pain intensified,  Developed burning quality  and was accompanied by  A crop of  blistering lesions.  Type  2 DM:  He has not been checking sugars regularly due to travelling with son.    Toenail fungus   Colon ca screening.  He feels he is due for colonoscopy .  Has had 3 , due to history of adenomatous  polyps  Removed in 2009 . He has no change in bowel habits and has not seen any blood in his stools. He is requesting referral to sister's GI doctor in [place]  Referral coordinator Kim [phone]      Outpatient Prescriptions Prior to Visit  Medication Sig Dispense Refill  . ALPRAZolam (XANAX) 0.5 MG tablet Take 1 tablet (0.5 mg total) by mouth at bedtime as needed for anxiety. 30 tablet 3  . amLODipine (NORVASC) 10 MG tablet TAKE ONE TABLET BY MOUTH ONCE DAILY 90 tablet 3  . Ascorbic Acid (VITAMIN C) 100 MG tablet Take 100 mg by mouth daily.    Marland Kitchen aspirin 325 MG tablet Take 325 mg by mouth daily.    . Azelastine-Fluticasone (DYMISTA) 137-50 MCG/ACT SUSP Place 2 Squirts into the nose daily. In each nostril 23 g 11  . fish oil-omega-3 fatty acids 1000 MG capsule Take 2 g by mouth daily.      Marland Kitchen glipiZIDE (GLUCOTROL) 10 MG tablet TAKE ONE TABLET BY MOUTH TWICE DAILY BEFORE MEAL(S) 60 tablet 5  . glucosamine-chondroitin 500-400 MG tablet Take 1 tablet by mouth 3 (three) times daily.    . INS SYRINGE/NEEDLE 1CC/28G (B-D INSULIN SYRINGE 1CC/28G) 28G X 1/2" 1 ML MISC 1 Syringe by Does not apply route daily after supper. 100 each 0  . meloxicam (MOBIC) 15 MG tablet TAKE ONE TABLET BY MOUTH ONCE DAILY 90 tablet 1  . metFORMIN (GLUCOPHAGE) 500 MG tablet Take 1 tablet (500 mg total) by mouth 2 (two) times daily with a meal. 180 tablet 1  . Multiple Vitamins-Minerals (MULTIVITAMIN WITH MINERALS) tablet Take 1 tablet by mouth daily.    . pravastatin (PRAVACHOL) 40 MG tablet TAKE ONE TABLET BY MOUTH ONCE DAILY 90 tablet 0  . sildenafil (VIAGRA) 100 MG tablet Take 1 tablet (100 mg total) by mouth daily as needed for erectile dysfunction. 10 tablet 3  . sitaGLIPtin (JANUVIA) 100 MG tablet Take 1 tablet (100 mg total) by mouth daily. 30 tablet 12  . testosterone cypionate (DEPOTESTOTERONE CYPIONATE) 200 MG/ML injection Inject 1 mL (200 mg total) into the muscle every 14 (fourteen) days. 10 mL 3  . traMADol (ULTRAM) 50 MG tablet TAKE ONE TABLET BY MOUTH EVERY 6 HOURS AS NEEDED FOR PAIN 120 tablet 0  . lisinopril (  PRINIVIL,ZESTRIL) 20 MG tablet Take 1 tablet (20 mg total) by mouth daily. 90 tablet 1   No facility-administered medications prior to visit.    Review of Systems;  Patient denies headache, fevers, malaise, unintentional weight loss, skin rash, eye pain, sinus congestion and sinus pain, sore throat, dysphagia,  hemoptysis , cough, dyspnea, wheezing, chest pain, palpitations, orthopnea, edema, abdominal pain, nausea, melena, diarrhea, constipation, flank pain, dysuria, hematuria, urinary  Frequency, nocturia, numbness, tingling, seizures,  Focal weakness, Loss of consciousness,  Tremor, insomnia, depression, anxiety, and suicidal ideation.      Objective:  BP 140/76 mmHg  Pulse 78   Temp(Src) 98.3 F (36.8 C) (Oral)  Resp 14  Ht 6\' 4"  (1.93 m)  Wt 309 lb 12 oz (140.502 kg)  BMI 37.72 kg/m2  SpO2 98%  BP Readings from Last 3 Encounters:  07/04/14 140/76  06/20/14 152/76  03/24/14 138/80    Wt Readings from Last 3 Encounters:  07/04/14 309 lb 12 oz (140.502 kg)  06/20/14 308 lb (139.708 kg)  03/24/14 308 lb (139.708 kg)    General appearance: alert, cooperative and appears stated age Ears: normal TM's and external ear canals both ears Throat: lips, mucosa, and tongue normal; teeth and gums normal Neck: no adenopathy, no carotid bruit, supple, symmetrical, trachea midline and thyroid not enlarged, symmetric, no tenderness/mass/nodules Back: symmetric, no curvature. ROM normal. No CVA tenderness. Lungs: clear to auscultation bilaterally Heart: regular rate and rhythm, S1, S2 normal, no murmur, click, rub or gallop Abdomen: soft, non-tender; bowel sounds normal; no masses,  no organomegaly Pulses: 2+ and symmetric Skin: Skin color, texture, turgor normal. No rashes or lesions Lymph nodes: Cervical, supraclavicular, and axillary nodes normal.  Lab Results  Component Value Date   HGBA1C 6.7* 07/04/2014   HGBA1C 7.2* 03/24/2014   HGBA1C 7.7* 10/31/2013    Lab Results  Component Value Date   CREATININE 0.87 07/04/2014   CREATININE 0.86 03/24/2014   CREATININE 0.9 10/31/2013    Lab Results  Component Value Date   WBC 7.5 10/31/2013   HGB 12.6* 10/31/2013   HCT 37.8* 10/31/2013   PLT 189.0 10/31/2013   GLUCOSE 250* 07/04/2014   CHOL 153 07/04/2014   TRIG 198.0* 07/04/2014   HDL 39.40 07/04/2014   LDLDIRECT 84.0 07/04/2014   LDLCALC 74 07/04/2014   ALT 34 07/04/2014   AST 20 07/04/2014   NA 139 07/04/2014   K 4.2 07/04/2014   CL 105 07/04/2014   CREATININE 0.87 07/04/2014   BUN 18 07/04/2014   CO2 25 07/04/2014   TSH 0.83 10/31/2013   PSA 0.59 11/01/2012   HGBA1C 6.7* 07/04/2014   MICROALBUR 2.4* 07/04/2014    No results  found.  Assessment & Plan:   Problem List Items Addressed This Visit      Unprioritized   Hypertension    Elevated today.  Adding hctz to lisinopril.       Relevant Medications   lisinopril-hydrochlorothiazide (ZESTORETIC) 20-12.5 MG per tablet   Other Relevant Orders   Comprehensive metabolic panel (Completed)   Obesity, morbid, BMI 40.0-49.9    I have addressed  BMI and recommended a low glycemic index diet utilizing smaller more frequent meals to increase metabolism.  I have also recommended that patient start exercising with a goal of 30 minutes of aerobic exercise a minimum of 5 days per week. Screening for lipid disorders, thyroid and diabetes to be done today.    Wt Readings from Last 3 Encounters:  07/04/14 309 lb 12  oz (140.502 kg)  06/20/14 308 lb (139.708 kg)  03/24/14 308 lb (139.708 kg)        Diabetes mellitus with neuropathy - Primary    Currently well-controlled on current medications .  hemoglobin A1c is at goal of less than 7.0 . Patient is reminded to schedule an annual eye exam and foot exam is normal today. Patient has no microalbuminuria. Patient is tolerating statin therapy for CAD risk reduction and on ACE/ARB for renal protection and hypertension   Lab Results  Component Value Date   HGBA1C 6.7* 07/04/2014   Lab Results  Component Value Date   MICROALBUR 2.4* 07/04/2014         Relevant Medications   lisinopril-hydrochlorothiazide (ZESTORETIC) 20-12.5 MG per tablet   Hyperlipidemia    Tolerating  pravastatin . HDL is improved,  trigs mildly elevated .  LFTS normal  Lab Results  Component Value Date   CHOL 153 07/04/2014   HDL 39.40 07/04/2014   LDLCALC 74 07/04/2014   LDLDIRECT 84.0 07/04/2014   TRIG 198.0* 07/04/2014   CHOLHDL 4 07/04/2014             Relevant Medications   lisinopril-hydrochlorothiazide (ZESTORETIC) 20-12.5 MG per tablet   Other Relevant Orders   LDL cholesterol, direct (Completed)   Lipid panel (Completed)    History of shingles    Now resolved,  Mild case,  Return for shingles vaccine 6 months      History of colonic polyps    Per patient, history of adenomatous polyp by 2009 colonoscopy,  Overdue for follow up due to lack of insurance previously,  Will refer to [facility].        Onychomycosis of toenail    advised to try the home remedies , given need to stop statin for pharmacotherapy        Other Visit Diagnoses    Screening for colon cancer        Relevant Orders    Ambulatory referral to Gastroenterology       I have discontinued Mr. Hageman's lisinopril. I am also having him start on lisinopril-hydrochlorothiazide. Additionally, I am having him maintain his multivitamin with minerals, vitamin C, fish oil-omega-3 fatty acids, glucosamine-chondroitin, sildenafil, INS SYRINGE/NEEDLE 1CC/28G, traMADol, aspirin, Azelastine-Fluticasone, ALPRAZolam, sitaGLIPtin, amLODipine, pravastatin, testosterone cypionate, meloxicam, glipiZIDE, and metFORMIN.  Meds ordered this encounter  Medications  . lisinopril-hydrochlorothiazide (ZESTORETIC) 20-12.5 MG per tablet    Sig: Take 1 tablet by mouth daily.    Dispense:  90 tablet    Refill:  3    Medications Discontinued During This Encounter  Medication Reason  . lisinopril (PRINIVIL,ZESTRIL) 20 MG tablet     Follow-up: Return in about 3 months (around 10/04/2014) for follow up diabetes.   Crecencio Mc, MD

## 2014-07-04 NOTE — Progress Notes (Signed)
Pre-visit discussion using our clinic review tool. No additional management support is needed unless otherwise documented below in the visit note.  

## 2014-07-07 ENCOUNTER — Encounter: Payer: Self-pay | Admitting: Internal Medicine

## 2014-07-07 DIAGNOSIS — B351 Tinea unguium: Secondary | ICD-10-CM | POA: Insufficient documentation

## 2014-07-07 NOTE — Assessment & Plan Note (Signed)
Currently well-controlled on current medications .  hemoglobin A1c is at goal of less than 7.0 . Patient is reminded to schedule an annual eye exam and foot exam is normal today. Patient has no microalbuminuria. Patient is tolerating statin therapy for CAD risk reduction and on ACE/ARB for renal protection and hypertension   Lab Results  Component Value Date   HGBA1C 6.7* 07/04/2014   Lab Results  Component Value Date   MICROALBUR 2.4* 07/04/2014

## 2014-07-07 NOTE — Assessment & Plan Note (Signed)
I have addressed  BMI and recommended a low glycemic index diet utilizing smaller more frequent meals to increase metabolism.  I have also recommended that patient start exercising with a goal of 30 minutes of aerobic exercise a minimum of 5 days per week. Screening for lipid disorders, thyroid and diabetes to be done today.    Wt Readings from Last 3 Encounters:  07/04/14 309 lb 12 oz (140.502 kg)  06/20/14 308 lb (139.708 kg)  03/24/14 308 lb (139.708 kg)

## 2014-07-07 NOTE — Assessment & Plan Note (Signed)
Per patient, history of adenomatous polyp by 2009 colonoscopy,  Overdue for follow up due to lack of insurance previously,  Will refer to [place].

## 2014-07-07 NOTE — Assessment & Plan Note (Signed)
Elevated today.  Adding hctz to lisinopril.

## 2014-07-07 NOTE — Assessment & Plan Note (Signed)
advised to try the home remedies , given need to stop statin for pharmacotherapy

## 2014-07-07 NOTE — Assessment & Plan Note (Signed)
Tolerating  pravastatin . HDL is improved,  trigs mildly elevated .  LFTS normal  Lab Results  Component Value Date   CHOL 153 07/04/2014   HDL 39.40 07/04/2014   LDLCALC 74 07/04/2014   LDLDIRECT 84.0 07/04/2014   TRIG 198.0* 07/04/2014   CHOLHDL 4 07/04/2014

## 2014-07-07 NOTE — Assessment & Plan Note (Signed)
Now resolved,  Mild case,  Return for shingles vaccine 6 months

## 2014-07-29 ENCOUNTER — Other Ambulatory Visit: Payer: Self-pay | Admitting: Internal Medicine

## 2014-08-01 NOTE — Telephone Encounter (Signed)
Mailed unread my chart message to patient

## 2014-08-25 ENCOUNTER — Other Ambulatory Visit: Payer: Self-pay | Admitting: Internal Medicine

## 2014-08-25 ENCOUNTER — Encounter: Payer: Self-pay | Admitting: Internal Medicine

## 2014-08-25 DIAGNOSIS — E669 Obesity, unspecified: Secondary | ICD-10-CM

## 2014-08-25 DIAGNOSIS — R0683 Snoring: Secondary | ICD-10-CM

## 2014-08-25 DIAGNOSIS — R61 Generalized hyperhidrosis: Secondary | ICD-10-CM

## 2014-08-25 DIAGNOSIS — I10 Essential (primary) hypertension: Secondary | ICD-10-CM

## 2014-09-09 ENCOUNTER — Ambulatory Visit: Payer: BLUE CROSS/BLUE SHIELD | Attending: Specialist

## 2014-09-09 DIAGNOSIS — I10 Essential (primary) hypertension: Secondary | ICD-10-CM | POA: Diagnosis present

## 2014-09-09 DIAGNOSIS — G4733 Obstructive sleep apnea (adult) (pediatric): Secondary | ICD-10-CM | POA: Diagnosis not present

## 2014-09-09 DIAGNOSIS — G4761 Periodic limb movement disorder: Secondary | ICD-10-CM | POA: Diagnosis not present

## 2014-09-09 DIAGNOSIS — R61 Generalized hyperhidrosis: Secondary | ICD-10-CM | POA: Diagnosis not present

## 2014-09-09 DIAGNOSIS — E669 Obesity, unspecified: Secondary | ICD-10-CM | POA: Insufficient documentation

## 2014-09-09 DIAGNOSIS — R0683 Snoring: Secondary | ICD-10-CM | POA: Diagnosis present

## 2014-09-29 LAB — HM COLONOSCOPY: HM Colonoscopy: 3

## 2014-10-01 ENCOUNTER — Telehealth: Payer: Self-pay | Admitting: Internal Medicine

## 2014-10-01 DIAGNOSIS — Z9989 Dependence on other enabling machines and devices: Secondary | ICD-10-CM

## 2014-10-01 DIAGNOSIS — G4733 Obstructive sleep apnea (adult) (pediatric): Secondary | ICD-10-CM

## 2014-10-01 NOTE — Assessment & Plan Note (Signed)
Repeat sleep study showed moderate OSA , worst in the supine position .  Titration study has been ordered.

## 2014-10-01 NOTE — Telephone Encounter (Signed)
Patient notified and voiced understanding.

## 2014-10-01 NOTE — Telephone Encounter (Signed)
Repeat sleep study showed moderate OSA , worst in the supine position .  Titration study has been ordered.

## 2014-10-13 ENCOUNTER — Other Ambulatory Visit: Payer: BLUE CROSS/BLUE SHIELD

## 2014-10-13 ENCOUNTER — Encounter: Payer: Self-pay | Admitting: *Deleted

## 2014-10-13 ENCOUNTER — Ambulatory Visit (INDEPENDENT_AMBULATORY_CARE_PROVIDER_SITE_OTHER): Payer: BLUE CROSS/BLUE SHIELD | Admitting: Internal Medicine

## 2014-10-13 ENCOUNTER — Encounter: Payer: Self-pay | Admitting: Internal Medicine

## 2014-10-13 VITALS — BP 120/76 | HR 75 | Temp 98.2°F | Resp 12 | Ht 74.0 in | Wt 308.8 lb

## 2014-10-13 DIAGNOSIS — E1140 Type 2 diabetes mellitus with diabetic neuropathy, unspecified: Secondary | ICD-10-CM | POA: Diagnosis not present

## 2014-10-13 DIAGNOSIS — B351 Tinea unguium: Secondary | ICD-10-CM

## 2014-10-13 DIAGNOSIS — Z860100 Personal history of colon polyps, unspecified: Secondary | ICD-10-CM

## 2014-10-13 DIAGNOSIS — I10 Essential (primary) hypertension: Secondary | ICD-10-CM

## 2014-10-13 DIAGNOSIS — E1340 Other specified diabetes mellitus with diabetic neuropathy, unspecified: Secondary | ICD-10-CM

## 2014-10-13 DIAGNOSIS — Z23 Encounter for immunization: Secondary | ICD-10-CM

## 2014-10-13 DIAGNOSIS — J3089 Other allergic rhinitis: Secondary | ICD-10-CM

## 2014-10-13 DIAGNOSIS — J309 Allergic rhinitis, unspecified: Secondary | ICD-10-CM | POA: Insufficient documentation

## 2014-10-13 LAB — LIPID PANEL
Cholesterol: 183 mg/dL (ref 0–200)
HDL: 40.4 mg/dL (ref >39.00)
NONHDL: 142.34
TRIGLYCERIDES: 303 mg/dL — AB (ref 0.0–149.0)
Total CHOL/HDL Ratio: 5
VLDL: 60.6 mg/dL — ABNORMAL HIGH (ref 0.0–40.0)

## 2014-10-13 LAB — COMPREHENSIVE METABOLIC PANEL
ALT: 57 U/L — AB (ref 0–53)
AST: 38 U/L — ABNORMAL HIGH (ref 0–37)
Albumin: 4.4 g/dL (ref 3.5–5.2)
Alkaline Phosphatase: 51 U/L (ref 39–117)
BILIRUBIN TOTAL: 0.5 mg/dL (ref 0.2–1.2)
BUN: 16 mg/dL (ref 6–23)
CHLORIDE: 103 meq/L (ref 96–112)
CO2: 23 meq/L (ref 19–32)
Calcium: 9.8 mg/dL (ref 8.4–10.5)
Creatinine, Ser: 0.9 mg/dL (ref 0.40–1.50)
GFR: 92.51 mL/min (ref >60.00)
GLUCOSE: 219 mg/dL — AB (ref 70–99)
Potassium: 4 mEq/L (ref 3.5–5.1)
Sodium: 140 mEq/L (ref 135–145)
Total Protein: 7.3 g/dL (ref 6.0–8.3)

## 2014-10-13 LAB — MICROALBUMIN / CREATININE URINE RATIO
CREATININE, U: 211.6 mg/dL
Creatinine,U: 212.1 mg/dL
MICROALB UR: 1.6 mg/dL (ref 0.0–1.9)
MICROALB UR: 1.7 mg/dL (ref 0.0–1.9)
MICROALB/CREAT RATIO: 0.8 mg/g (ref 0.0–30.0)
Microalb Creat Ratio: 0.8 mg/g (ref 0.0–30.0)

## 2014-10-13 LAB — HEMOGLOBIN A1C: Hgb A1c MFr Bld: 7 % — ABNORMAL HIGH (ref 4.6–6.5)

## 2014-10-13 LAB — HM DIABETES FOOT EXAM: HM DIABETIC FOOT EXAM: NORMAL

## 2014-10-13 NOTE — Assessment & Plan Note (Signed)
Currently well-controlled on current medications .  hemoglobin A1c is pending  . Patient is reminded to schedule an annual eye exam and foot exam is normal today. Patient has no microalbuminuria. Patient is tolerating statin therapy for CAD risk reduction and on ACE/ARB for renal protection and hypertension   Lab Results  Component Value Date   HGBA1C 6.7* 07/04/2014   Lab Results  Component Value Date   MICROALBUR 1.7 10/13/2014

## 2014-10-13 NOTE — Assessment & Plan Note (Signed)
I have addressed  BMI and recommended wt loss of 10% of body weigh over the next 6 months using a low glycemic index diet and regular exercise a minimum of 5 days per week.   

## 2014-10-13 NOTE — Progress Notes (Signed)
Pre-visit discussion using our clinic review tool. No additional management support is needed unless otherwise documented below in the visit note.  

## 2014-10-13 NOTE — Assessment & Plan Note (Addendum)
Well controlled on current regimen, no changes today.  Lab Results  Component Value Date   CREATININE 0.87 07/04/2014   Lab Results  Component Value Date   NA 139 07/04/2014   K 4.2 07/04/2014   CL 105 07/04/2014   CO2 25 07/04/2014

## 2014-10-13 NOTE — Assessment & Plan Note (Signed)
contineu topical reatment

## 2014-10-13 NOTE — Patient Instructions (Signed)

## 2014-10-13 NOTE — Assessment & Plan Note (Signed)
There are several previously prescribed medications for seasonal allergies that are now available otc.   he can try  allegra (fexofenadine 180 mg daily), zyrtec (cetirizine 10 mg daily) and claritin (loratadine 10 mg daily) .  If those fail, the next step is a steroid nasal spray which I can prescribe.

## 2014-10-13 NOTE — Progress Notes (Signed)
Subjective:  Patient ID: David Pineda, male    DOB: 1955-11-06  Age: 59 y.o. MRN: [id number]  CC: The primary encounter diagnosis was Type 2 diabetes mellitus with diabetic neuropathy, without long-term current use of insulin ([place]). Diagnoses of Encounter for immunization, Essential hypertension, Onychomycosis of toenail, History of colonic polyps, Obesity, morbid, BMI 40.0-49.9 ([place]), and Other allergic rhinitis were also pertinent to this visit.  HPI David Pineda presents for diabetes follow up  Not checking sugars with any regularlity,  Following a low GI diet most days.  Exercising at most twice weekly.     3 polyps retrieved and biopsied  from recent colonsocopy done by GI in [place],  waiting for path report .  Was done two weeks ago   Flu shot today   Onychomycosis:  He has been using topical home remedies.    Using CPAP for OSA but his machine is over 66 years old and shows evidence of corrosion,  So Sleep study was repeated.  Sleep apnea has been confirmed but he has to have a titration study which is scheduled for Oct 17th   Has been having a nonproductive cough and increased post nasal drainage for the last several  Weeks, without fevers, sinus or ear pain.    Outpatient Prescriptions Prior to Visit  Medication Sig Dispense Refill  . ALPRAZolam (XANAX) 0.5 MG tablet Take 1 tablet (0.5 mg total) by mouth at bedtime as needed for anxiety. 30 tablet 3  . amLODipine (NORVASC) 10 MG tablet TAKE ONE TABLET BY MOUTH ONCE DAILY 90 tablet 3  . Ascorbic Acid (VITAMIN C) 100 MG tablet Take 100 mg by mouth daily.    Marland Kitchen aspirin 325 MG tablet Take 325 mg by mouth daily.    . Azelastine-Fluticasone (DYMISTA) 137-50 MCG/ACT SUSP Place 2 Squirts into the nose daily. In each nostril 23 g 11  . fish oil-omega-3 fatty acids 1000 MG capsule Take 2 g by mouth daily.    Marland Kitchen glipiZIDE (GLUCOTROL) 10 MG tablet TAKE ONE TABLET BY MOUTH TWICE DAILY BEFORE MEAL(S) 60 tablet 5  .  glucosamine-chondroitin 500-400 MG tablet Take 1 tablet by mouth 3 (three) times daily.    . INS SYRINGE/NEEDLE 1CC/28G (B-D INSULIN SYRINGE 1CC/28G) 28G X 1/2" 1 ML MISC 1 Syringe by Does not apply route daily after supper. 100 each 0  . lisinopril-hydrochlorothiazide (ZESTORETIC) 20-12.5 MG per tablet Take 1 tablet by mouth daily. 90 tablet 3  . meloxicam (MOBIC) 15 MG tablet TAKE ONE TABLET BY MOUTH ONCE DAILY 90 tablet 1  . metFORMIN (GLUCOPHAGE) 500 MG tablet Take 1 tablet (500 mg total) by mouth 2 (two) times daily with a meal. 180 tablet 1  . Multiple Vitamins-Minerals (MULTIVITAMIN WITH MINERALS) tablet Take 1 tablet by mouth daily.    . pravastatin (PRAVACHOL) 40 MG tablet TAKE ONE TABLET BY MOUTH ONCE DAILY 90 tablet 0  . sildenafil (VIAGRA) 100 MG tablet Take 1 tablet (100 mg total) by mouth daily as needed for erectile dysfunction. 10 tablet 3  . sitaGLIPtin (JANUVIA) 100 MG tablet Take 1 tablet (100 mg total) by mouth daily. 30 tablet 12  . testosterone cypionate (DEPOTESTOTERONE CYPIONATE) 200 MG/ML injection Inject 1 mL (200 mg total) into the muscle every 14 (fourteen) days. 10 mL 3  . traMADol (ULTRAM) 50 MG tablet TAKE ONE TABLET BY MOUTH EVERY 6 HOURS AS NEEDED FOR PAIN 120 tablet 0   No facility-administered medications prior to visit.    Review of Systems;  Patient denies headache, fevers, malaise, unintentional weight loss, skin rash, eye pain, sinus congestion and sinus pain, sore throat, dysphagia,  hemoptysis , cough, dyspnea, wheezing, chest pain, palpitations, orthopnea, edema, abdominal pain, nausea, melena, diarrhea, constipation, flank pain, dysuria, hematuria, urinary  Frequency, nocturia, numbness, tingling, seizures,  Focal weakness, Loss of consciousness,  Tremor, insomnia, depression, anxiety, and suicidal ideation.      Objective:  BP 120/76 mmHg  Pulse 75  Temp(Src) 98.2 F (36.8 C) (Oral)  Resp 12  Ht 6\' 2"  (1.88 m)  Wt 308 lb 12 oz (140.048 kg)  BMI  39.62 kg/m2  SpO2 97%  BP Readings from Last 3 Encounters:  10/13/14 120/76  07/04/14 140/76  06/20/14 152/76    Wt Readings from Last 3 Encounters:  10/13/14 308 lb 12 oz (140.048 kg)  07/04/14 309 lb 12 oz (140.502 kg)  06/20/14 308 lb (139.708 kg)    General appearance: alert, cooperative and appears stated age Ears: normal TM's and external ear canals both ears Throat: lips, mucosa, and tongue normal; teeth and gums normal Neck: no adenopathy, no carotid bruit, supple, symmetrical, trachea midline and thyroid not enlarged, symmetric, no tenderness/mass/nodules Back: symmetric, no curvature. ROM normal. No CVA tenderness. Lungs: clear to auscultation bilaterally Heart: regular rate and rhythm, S1, S2 normal, no murmur, click, rub or gallop Abdomen: soft, non-tender; bowel sounds normal; no masses,  no organomegaly Pulses: 2+ and symmetric Skin: Skin color, texture, turgor normal. No rashes or lesions Lymph nodes: Cervical, supraclavicular, and axillary nodes normal.  Lab Results  Component Value Date   HGBA1C 6.7* 07/04/2014   HGBA1C 7.2* 03/24/2014   HGBA1C 7.7* 10/31/2013    Lab Results  Component Value Date   CREATININE 0.87 07/04/2014   CREATININE 0.86 03/24/2014   CREATININE 0.9 10/31/2013    Lab Results  Component Value Date   WBC 7.5 10/31/2013   HGB 12.6* 10/31/2013   HCT 37.8* 10/31/2013   PLT 189.0 10/31/2013   GLUCOSE 250* 07/04/2014   CHOL 153 07/04/2014   TRIG 198.0* 07/04/2014   HDL 39.40 07/04/2014   LDLDIRECT 84.0 07/04/2014   LDLCALC 74 07/04/2014   ALT 34 07/04/2014   AST 20 07/04/2014   NA 139 07/04/2014   K 4.2 07/04/2014   CL 105 07/04/2014   CREATININE 0.87 07/04/2014   BUN 18 07/04/2014   CO2 25 07/04/2014   TSH 0.83 10/31/2013   PSA 0.59 11/01/2012   HGBA1C 6.7* 07/04/2014   MICROALBUR 1.7 10/13/2014    No results found.  Assessment & Plan:   Problem List Items Addressed This Visit    Hypertension    Well controlled  on current regimen, no changes today.  Lab Results  Component Value Date   CREATININE 0.87 07/04/2014   Lab Results  Component Value Date   NA 139 07/04/2014   K 4.2 07/04/2014   CL 105 07/04/2014   CO2 25 07/04/2014         Obesity, morbid, BMI 40.0-49.9 ([place])    I have addressed  BMI and recommended wt loss of 10% of body weigh over the next 6 months using a low glycemic index diet and regular exercise a minimum of 5 days per week.        Diabetes mellitus with neuropathy ([place]) - Primary    Currently well-controlled on current medications .  hemoglobin A1c is pending  . Patient is reminded to schedule an annual eye exam and foot exam is normal today. Patient has no  microalbuminuria. Patient is tolerating statin therapy for CAD risk reduction and on ACE/ARB for renal protection and hypertension   Lab Results  Component Value Date   HGBA1C 6.7* 07/04/2014   Lab Results  Component Value Date   MICROALBUR 1.7 10/13/2014           Relevant Orders   Comprehensive metabolic panel   Lipid panel   Hemoglobin A1c   LDL cholesterol, direct   Microalbumin / creatinine urine ratio   History of colonic polyps    Recurrent, by recent colonosopy , path report pending       Onychomycosis of toenail    contineu topical reatment      Allergic rhinitis    There are several previously prescribed medications for seasonal allergies that are now available otc.   he can try  allegra (fexofenadine 180 mg daily), zyrtec (cetirizine 10 mg daily) and claritin (loratadine 10 mg daily) .  If those fail, the next step is a steroid nasal spray which I can prescribe.        Other Visit Diagnoses    Encounter for immunization          A total of 25 minutes of face to face time was spent with patient more than half of which was spent in counselling about the above mentioned conditions  and coordination of care  I am having Mr. Everhart maintain his multivitamin with minerals, vitamin C, fish  oil-omega-3 fatty acids, glucosamine-chondroitin, sildenafil, INS SYRINGE/NEEDLE 1CC/28G, traMADol, aspirin, Azelastine-Fluticasone, ALPRAZolam, sitaGLIPtin, amLODipine, testosterone cypionate, meloxicam, glipiZIDE, metFORMIN, lisinopril-hydrochlorothiazide, pravastatin, and Potassium.  Meds ordered this encounter  Medications  . Potassium 99 MG TABS    Sig: Take 1 tablet by mouth daily.    There are no discontinued medications.  Follow-up: No Follow-up on file.   Crecencio Mc, MD

## 2014-10-13 NOTE — Assessment & Plan Note (Signed)
Recurrent, by recent colonosopy , path report pending

## 2014-10-14 LAB — LDL CHOLESTEROL, DIRECT: Direct LDL: 111 mg/dL

## 2014-10-15 ENCOUNTER — Encounter: Payer: Self-pay | Admitting: Internal Medicine

## 2014-10-27 ENCOUNTER — Ambulatory Visit: Payer: BLUE CROSS/BLUE SHIELD | Attending: Otolaryngology

## 2014-10-27 DIAGNOSIS — G4733 Obstructive sleep apnea (adult) (pediatric): Secondary | ICD-10-CM | POA: Insufficient documentation

## 2014-10-27 DIAGNOSIS — G4761 Periodic limb movement disorder: Secondary | ICD-10-CM | POA: Insufficient documentation

## 2014-10-27 DIAGNOSIS — R0683 Snoring: Secondary | ICD-10-CM | POA: Insufficient documentation

## 2014-10-31 ENCOUNTER — Telehealth: Payer: Self-pay | Admitting: Internal Medicine

## 2014-10-31 NOTE — Telephone Encounter (Signed)
Sleep study confirmed that patient has sleep apnea, Sleep Med order for supplies has been signed

## 2014-10-31 NOTE — Telephone Encounter (Signed)
Order faxed and patient notified.  

## 2014-11-12 ENCOUNTER — Encounter: Payer: Self-pay | Admitting: Internal Medicine

## 2014-11-13 NOTE — Telephone Encounter (Signed)
Please schedule follow up appt on sleep study with Dr. Derrel Nip.

## 2014-11-18 ENCOUNTER — Encounter: Payer: Self-pay | Admitting: Internal Medicine

## 2014-11-18 ENCOUNTER — Ambulatory Visit (INDEPENDENT_AMBULATORY_CARE_PROVIDER_SITE_OTHER): Payer: BLUE CROSS/BLUE SHIELD | Admitting: Internal Medicine

## 2014-11-18 VITALS — BP 132/64 | HR 90 | Temp 98.2°F | Ht 74.0 in | Wt 315.2 lb

## 2014-11-18 DIAGNOSIS — Z9989 Dependence on other enabling machines and devices: Secondary | ICD-10-CM

## 2014-11-18 DIAGNOSIS — G4733 Obstructive sleep apnea (adult) (pediatric): Secondary | ICD-10-CM | POA: Diagnosis not present

## 2014-11-18 DIAGNOSIS — G44039 Episodic paroxysmal hemicrania, not intractable: Secondary | ICD-10-CM

## 2014-11-18 MED ORDER — ALPRAZOLAM 0.5 MG PO TABS: 0.5000 mg | ORAL_TABLET | Freq: Every evening | ORAL | Status: DC | PRN

## 2014-11-18 NOTE — Patient Instructions (Signed)
Your headache may have been due to a pinched nerve coming off of C3 vertebra.  Keep track of when it occurs and what is proceeded by  You can resume use of alprazolam as needed for insomnia

## 2014-11-18 NOTE — Progress Notes (Signed)
Pre visit review using our clinic review tool, if applicable. No additional management support is needed unless otherwise documented below in the visit note. 

## 2014-11-18 NOTE — Progress Notes (Signed)
Subjective:  Patient ID: David Pineda, male    DOB: 1955/11/28  Age: 59 y.o. MRN: [id number]  CC: The primary encounter diagnosis was OSA on CPAP. Diagnoses of Obesity, morbid, BMI 40.0-49.9 (HCC) and Episodic paroxysmal hemicrania, not intractable were also pertinent to this visit.  HPI David Pineda presents for follow up to discuss 1)  the results form his recent  sleep study.  He underwent a full polysomnopraphy in August followed by a CPAP titration study in July.  All snoring evetns were stopped with titration of CPAP to 10 cm H20  He notes that he is sleeping better and has more daytime energy since his CPAP was replaced.  He is averaging 6 to 8 hours per night of use.    2) Recent isolated episode of severe stabbing pain in his right parietal area that lasted < 5 mintues and resolved spontaneously,  Did not occur after squatting or lifting something heavy.  No vision changes or dizziness accompanying symptoms.   He has a history of  abnormal brain MRI followed by endocrinology evaluation for possible pituitary tumor.  Reviewed his prior MRI report from 2009 which was done for follow up.  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    Next diabetic eye exam is planned for January     Outpatient Prescriptions Prior to Visit  Medication Sig Dispense Refill  . amLODipine (NORVASC) 10 MG tablet TAKE ONE TABLET BY MOUTH ONCE DAILY 90 tablet 3  . Ascorbic Acid (VITAMIN C) 100 MG tablet Take 100 mg by mouth daily.    Marland Kitchen aspirin 325 MG tablet Take 325 mg by mouth daily.    . Azelastine-Fluticasone (DYMISTA) 137-50 MCG/ACT SUSP Place 2 Squirts into the nose daily. In each nostril 23 g 11  . fish oil-omega-3 fatty acids 1000 MG capsule Take 2 g by mouth daily.    Marland Kitchen glipiZIDE (GLUCOTROL) 10 MG tablet TAKE ONE TABLET BY MOUTH TWICE DAILY BEFORE MEAL(S) 60 tablet 5  . glucosamine-chondroitin 500-400 MG tablet Take 1 tablet by mouth 3 (three) times daily.    .  INS SYRINGE/NEEDLE 1CC/28G (B-D INSULIN SYRINGE 1CC/28G) 28G X 1/2" 1 ML MISC 1 Syringe by Does not apply route daily after supper. 100 each 0  . lisinopril-hydrochlorothiazide (ZESTORETIC) 20-12.5 MG per tablet Take 1 tablet by mouth daily. 90 tablet 3  . meloxicam (MOBIC) 15 MG tablet TAKE ONE TABLET BY MOUTH ONCE DAILY 90 tablet 1  . metFORMIN (GLUCOPHAGE) 500 MG tablet Take 1 tablet (500 mg total) by mouth 2 (two) times daily with a meal. 180 tablet 1  . Multiple Vitamins-Minerals (MULTIVITAMIN WITH MINERALS) tablet Take 1 tablet by mouth daily.    . Potassium 99 MG TABS Take 1 tablet by mouth daily.    . pravastatin (PRAVACHOL) 40 MG tablet TAKE ONE TABLET BY MOUTH ONCE DAILY 90 tablet 0  . sildenafil (VIAGRA) 100 MG tablet Take 1 tablet (100 mg total) by mouth daily as needed for erectile dysfunction. 10 tablet 3  . sitaGLIPtin (JANUVIA) 100 MG tablet Take 1 tablet (100 mg total) by mouth daily. 30 tablet 12  . testosterone cypionate (DEPOTESTOTERONE CYPIONATE) 200 MG/ML injection Inject 1 mL (200 mg total) into the muscle every 14 (fourteen) days. 10 mL 3  . traMADol (ULTRAM) 50 MG tablet TAKE ONE TABLET BY MOUTH EVERY 6 HOURS AS NEEDED FOR PAIN 120 tablet 0  . ALPRAZolam (XANAX) 0.5 MG tablet Take 1 tablet (0.5 mg  total) by mouth at bedtime as needed for anxiety. 30 tablet 3   No facility-administered medications prior to visit.    Review of Systems;  Patient denies headache, fevers, malaise, unintentional weight loss, skin rash, eye pain, sinus congestion and sinus pain, sore throat, dysphagia,  hemoptysis , cough, dyspnea, wheezing, chest pain, palpitations, orthopnea, edema, abdominal pain, nausea, melena, diarrhea, constipation, flank pain, dysuria, hematuria, urinary  Frequency, nocturia, numbness, tingling, seizures,  Focal weakness, Loss of consciousness,  Tremor, insomnia, depression, anxiety, and suicidal ideation.      Objective:  BP 132/64 mmHg  Pulse 90  Temp(Src) 98.2 F  (36.8 C) (Oral)  Ht 6\' 2"  (1.88 m)  Wt 315 lb 3.2 oz (142.974 kg)  BMI 40.45 kg/m2  SpO2 97%  BP Readings from Last 3 Encounters:  11/18/14 132/64  10/13/14 120/76  07/04/14 140/76    Wt Readings from Last 3 Encounters:  11/18/14 315 lb 3.2 oz (142.974 kg)  10/13/14 308 lb 12 oz (140.048 kg)  07/04/14 309 lb 12 oz (140.502 kg)    General appearance: alert, cooperative and appears stated age Neck: no adenopathy, no carotid bruit, supple, symmetrical, trachea midline and thyroid not enlarged, symmetric, no tenderness/mass/nodules Back: symmetric, no curvature. ROM normal. No CVA tenderness. Lungs: clear to auscultation bilaterally Heart: regular rate and rhythm, S1, S2 normal, no murmur, click, rub or gallop Abdomen: soft, non-tender; bowel sounds normal; no masses,  no organomegaly Pulses: 2+ and symmetric Neuro: CNs 2-12 intact. DTRs 2+/4 in biceps, brachioradialis, patellars and achilles. Muscle strength 5/5 in upper and lower exremities. Fine resting tremor bilaterally both hands cerebellar function normal. Romberg negative.  No pronator drift.   Gait normal.    Lab Results  Component Value Date   HGBA1C 7.0* 10/13/2014   HGBA1C 6.7* 07/04/2014   HGBA1C 7.2* 03/24/2014    Lab Results  Component Value Date   CREATININE 0.90 10/13/2014   CREATININE 0.87 07/04/2014   CREATININE 0.86 03/24/2014    Lab Results  Component Value Date   WBC 7.5 10/31/2013   HGB 12.6* 10/31/2013   HCT 37.8* 10/31/2013   PLT 189.0 10/31/2013   GLUCOSE 219* 10/13/2014   CHOL 183 10/13/2014   TRIG 303.0* 10/13/2014   HDL 40.40 10/13/2014   LDLDIRECT 111.0 10/13/2014   LDLCALC 74 07/04/2014   ALT 57* 10/13/2014   AST 38* 10/13/2014   NA 140 10/13/2014   K 4.0 10/13/2014   CL 103 10/13/2014   CREATININE 0.90 10/13/2014   BUN 16 10/13/2014   CO2 23 10/13/2014   TSH 0.83 10/31/2013   PSA 0.59 11/01/2012   HGBA1C 7.0* 10/13/2014   MICROALBUR 1.7 10/13/2014    No results  found.  Assessment & Plan:   Problem List Items Addressed This Visit    OSA on CPAP - Primary    Diagnosed by sleep study August/October 2016. he is wearing CPAP every night a minimum of 6 hours per night and notes improved daytime wakefulness and decreased fatigue       Obesity, morbid, BMI 40.0-49.9 ([place])    I have addressed  BMI and encouraged renewed efforts at  wt loss of 10% of body weight over the next 6 months using a low glycemic index diet and regular exercise a minimum of 5 days per week.        Headache    Recent episode discussed,  Given the isolated event , reviewed BP,  Prior MRIs with patient and offered to pursue workup for  both  cervical disk disease and aneurysm if symptoms recurred which would require plain films cervical spine and MR brain.         A total of 25 minutes was spent with patient more than half of which was spent in counseling patient on the above mentioned issues , reviewing and explaining previous reports and imaging studies done, and coordination of care.   I have changed Mr. Bulluck's ALPRAZolam. I am also having him maintain his multivitamin with minerals, vitamin C, fish oil-omega-3 fatty acids, glucosamine-chondroitin, sildenafil, INS SYRINGE/NEEDLE 1CC/28G, traMADol, aspirin, Azelastine-Fluticasone, sitaGLIPtin, amLODipine, testosterone cypionate, meloxicam, glipiZIDE, metFORMIN, lisinopril-hydrochlorothiazide, pravastatin, and Potassium.  Meds ordered this encounter  Medications  . ALPRAZolam (XANAX) 0.5 MG tablet    Sig: Take 1 tablet (0.5 mg total) by mouth at bedtime as needed for anxiety or sleep.    Dispense:  30 tablet    Refill:  3    Medications Discontinued During This Encounter  Medication Reason  . ALPRAZolam (XANAX) 0.5 MG tablet Reorder    Follow-up: Return in about 3 months (around 02/18/2015) for follow up diabetes.   Crecencio Mc, MD

## 2014-11-20 DIAGNOSIS — R51 Headache: Secondary | ICD-10-CM

## 2014-11-20 DIAGNOSIS — R519 Headache, unspecified: Secondary | ICD-10-CM | POA: Insufficient documentation

## 2014-11-20 NOTE — Assessment & Plan Note (Signed)
Diagnosed by sleep study August/October 2016. he is wearing CPAP every night a minimum of 6 hours per night and notes improved daytime wakefulness and decreased fatigue

## 2014-11-20 NOTE — Assessment & Plan Note (Signed)
Recent episode discussed,  Given the isolated event , reviewed BP,  Prior MRIs with patient and offered to pursue workup for both  cervical disk disease and aneurysm if symptoms recurred which would require plain films cervical spine and MR brain.

## 2014-11-20 NOTE — Assessment & Plan Note (Signed)
I have addressed  BMI and encouraged renewed efforts at  wt loss of 10% of body weight over the next 6 months using a low glycemic index diet and regular exercise a minimum of 5 days per week.

## 2014-11-21 ENCOUNTER — Other Ambulatory Visit: Payer: Self-pay | Admitting: Internal Medicine

## 2014-12-11 ENCOUNTER — Encounter: Payer: Self-pay | Admitting: Internal Medicine

## 2015-01-26 ENCOUNTER — Encounter: Payer: Self-pay | Admitting: Internal Medicine

## 2015-01-26 ENCOUNTER — Ambulatory Visit (INDEPENDENT_AMBULATORY_CARE_PROVIDER_SITE_OTHER): Payer: BLUE CROSS/BLUE SHIELD | Admitting: Internal Medicine

## 2015-01-26 VITALS — BP 154/78 | HR 93 | Temp 98.1°F | Resp 12 | Ht 74.0 in | Wt 314.5 lb

## 2015-01-26 DIAGNOSIS — E119 Type 2 diabetes mellitus without complications: Secondary | ICD-10-CM

## 2015-01-26 DIAGNOSIS — M170 Bilateral primary osteoarthritis of knee: Secondary | ICD-10-CM | POA: Diagnosis not present

## 2015-01-26 DIAGNOSIS — I158 Other secondary hypertension: Secondary | ICD-10-CM

## 2015-01-26 DIAGNOSIS — IMO0002 Reserved for concepts with insufficient information to code with codable children: Secondary | ICD-10-CM

## 2015-01-26 DIAGNOSIS — E1140 Type 2 diabetes mellitus with diabetic neuropathy, unspecified: Secondary | ICD-10-CM

## 2015-01-26 DIAGNOSIS — Z794 Long term (current) use of insulin: Secondary | ICD-10-CM

## 2015-01-26 DIAGNOSIS — E1121 Type 2 diabetes mellitus with diabetic nephropathy: Secondary | ICD-10-CM

## 2015-01-26 DIAGNOSIS — E785 Hyperlipidemia, unspecified: Secondary | ICD-10-CM

## 2015-01-26 DIAGNOSIS — E1165 Type 2 diabetes mellitus with hyperglycemia: Secondary | ICD-10-CM

## 2015-01-26 LAB — LDL CHOLESTEROL, DIRECT: Direct LDL: 96 mg/dL

## 2015-01-26 LAB — LIPID PANEL
CHOL/HDL RATIO: 4
Cholesterol: 175 mg/dL (ref 0–200)
HDL: 39.3 mg/dL (ref >39.00)
NONHDL: 135.88
TRIGLYCERIDES: 248 mg/dL — AB (ref 0.0–149.0)
VLDL: 49.6 mg/dL — ABNORMAL HIGH (ref 0.0–40.0)

## 2015-01-26 LAB — COMPREHENSIVE METABOLIC PANEL
ALK PHOS: 69 U/L (ref 39–117)
ALT: 61 U/L — AB (ref 0–53)
AST: 32 U/L (ref 0–37)
Albumin: 4.5 g/dL (ref 3.5–5.2)
BILIRUBIN TOTAL: 0.4 mg/dL (ref 0.2–1.2)
BUN: 19 mg/dL (ref 6–23)
CO2: 19 mEq/L (ref 19–32)
Calcium: 9.9 mg/dL (ref 8.4–10.5)
Chloride: 107 mEq/L (ref 96–112)
Creatinine, Ser: 0.9 mg/dL (ref 0.40–1.50)
GFR: 92.42 mL/min (ref >60.00)
GLUCOSE: 266 mg/dL — AB (ref 70–99)
Potassium: 4.2 mEq/L (ref 3.5–5.1)
SODIUM: 144 meq/L (ref 135–145)
TOTAL PROTEIN: 7.5 g/dL (ref 6.0–8.3)

## 2015-01-26 LAB — HEMOGLOBIN A1C: Hgb A1c MFr Bld: 8 % — ABNORMAL HIGH (ref 4.6–6.5)

## 2015-01-26 MED ORDER — AMLODIPINE BESYLATE 10 MG PO TABS: 10.0000 mg | ORAL_TABLET | Freq: Every day | ORAL | Status: DC

## 2015-01-26 MED ORDER — LISINOPRIL-HYDROCHLOROTHIAZIDE 20-12.5 MG PO TABS: 1.0000 | ORAL_TABLET | Freq: Every day | ORAL | Status: DC

## 2015-01-26 MED ORDER — TRAMADOL HCL 50 MG PO TABS: 50.0000 mg | ORAL_TABLET | Freq: Four times a day (QID) | ORAL | Status: DC | PRN

## 2015-01-26 MED ORDER — PRAVASTATIN SODIUM 40 MG PO TABS: 40.0000 mg | ORAL_TABLET | Freq: Every day | ORAL | Status: DC

## 2015-01-26 MED ORDER — SITAGLIPTIN PHOSPHATE 100 MG PO TABS: 100.0000 mg | ORAL_TABLET | Freq: Every day | ORAL | Status: DC

## 2015-01-26 MED ORDER — GLIPIZIDE 10 MG PO TABS: ORAL_TABLET | ORAL | Status: DC

## 2015-01-26 MED ORDER — MELOXICAM 15 MG PO TABS: 15.0000 mg | ORAL_TABLET | Freq: Every day | ORAL | Status: DC

## 2015-01-26 MED ORDER — METFORMIN HCL 500 MG PO TABS: 500.0000 mg | ORAL_TABLET | Freq: Two times a day (BID) | ORAL | Status: DC

## 2015-01-26 NOTE — Assessment & Plan Note (Addendum)
Aggravated yesterday by extended labor.  Using ice and tramadol .  Started osteobiflex/msm  last week  Has had injections ,  Last one Synvisc, done at [place],  Several years ago,  lasted a year

## 2015-01-26 NOTE — Patient Instructions (Signed)
Diabetes and Exercise Exercising regularly is important. It is not just about losing weight. It has many health benefits, such as:  Improving your overall fitness, flexibility, and endurance.  Increasing your bone density.  Helping with weight control.  Decreasing your body fat.  Increasing your muscle strength.  Reducing stress and tension.  Improving your overall health. People with diabetes who exercise gain additional benefits because exercise:  Reduces appetite.  Improves the body's use of blood sugar (glucose).  Helps lower or control blood glucose.  Decreases blood pressure.  Helps control blood lipids (such as cholesterol and triglycerides).  Improves the body's use of the hormone insulin by:  Increasing the body's insulin sensitivity.  Reducing the body's insulin needs.  Decreases the risk for heart disease because exercising:  Lowers cholesterol and triglycerides levels.  Increases the levels of good cholesterol (such as high-density lipoproteins [HDL]) in the body.  Lowers blood glucose levels. YOUR ACTIVITY PLAN  Choose an activity that you enjoy, and set realistic goals. To exercise safely, you should begin practicing any new physical activity slowly, and gradually increase the intensity of the exercise over time. Your health care provider or diabetes educator can help create an activity plan that works for you. General recommendations include:  Encouraging children to engage in at least 60 minutes of physical activity each day.  Stretching and performing strength training exercises, such as yoga or weight lifting, at least 2 times per week.  Performing a total of at least 150 minutes of moderate-intensity exercise each week, such as brisk walking or water aerobics.  Exercising at least 3 days per week, making sure you allow no more than 2 consecutive days to pass without exercising.  Avoiding long periods of inactivity (90 minutes or more). When you  have to spend an extended period of time sitting down, take frequent breaks to walk or stretch. RECOMMENDATIONS FOR EXERCISING WITH TYPE 1 OR TYPE 2 DIABETES   Check your blood glucose before exercising. If blood glucose levels are greater than 240 mg/dL, check for urine ketones. Do not exercise if ketones are present.  Avoid injecting insulin into areas of the body that are going to be exercised. For example, avoid injecting insulin into:  The arms when playing tennis.  The legs when jogging.  Keep a record of:  Food intake before and after you exercise.  Expected peak times of insulin action.  Blood glucose levels before and after you exercise.  The type and amount of exercise you have done.  Review your records with your health care provider. Your health care provider will help you to develop guidelines for adjusting food intake and insulin amounts before and after exercising.  If you take insulin or oral hypoglycemic agents, watch for signs and symptoms of hypoglycemia. They include:  Dizziness.  Shaking.  Sweating.  Chills.  Confusion.  Drink plenty of water while you exercise to prevent dehydration or heat stroke. Body water is lost during exercise and must be replaced.  Talk to your health care provider before starting an exercise program to make sure it is safe for you. Remember, almost any type of activity is better than none.   This information is not intended to replace advice given to you by your health care provider. Make sure you discuss any questions you have with your health care provider.   Document Released: 03/19/2003 Document Revised: 05/13/2014 Document Reviewed: 06/05/2012 Elsevier Interactive Patient Education 2016 [employer]  

## 2015-01-26 NOTE — Progress Notes (Signed)
Subjective:  Patient ID: David Pineda., male    DOB: [birth year]  Age: 60 y.o. MRN: [id number]  CC: The primary encounter diagnosis was Primary osteoarthritis of both knees. Diagnoses of Other secondary hypertension, Type 2 diabetes mellitus with diabetic neuropathy, without long-term current use of insulin ([place]), Diabetes mellitus without complication ([place]), Uncontrolled type 2 diabetes mellitus with diabetic nephropathy, with long-term current use of insulin ([place]), and Hyperlipidemia were also pertinent to this visit.  HPI David Pineda. presents for  3 month follow up on diabetes.  Patient has no complaints today.  Patient  Has not been following following a low glycemic index diet due to the holidays but is taking all prescribed medications regularly without side effects.  Fasting sugars have been under less than 140 most of the time and post prandials have been under 190 on most occasions. Patient is not exercising regularly, and has not lost weight  .  Patient is due for an eye exam.  He checks feet regularly for signs of infection.  Patient does not walk barefoot outside,  And denies an numbness tingling or burning in feet. Patient is up to date on all recommended vaccinations  He has had a pharmacy change and is requesting a 90 day supply of meds sent to [facility]  Per insurance  Toenail fungus  Lab Results  Component Value Date   HGBA1C 8.0* 01/26/2015      Outpatient Prescriptions Prior to Visit  Medication Sig Dispense Refill  . ALPRAZolam (XANAX) 0.5 MG tablet Take 1 tablet (0.5 mg total) by mouth at bedtime as needed for anxiety or sleep. 30 tablet 3  . Ascorbic Acid (VITAMIN C) 100 MG tablet Take 100 mg by mouth daily.    Marland Kitchen aspirin 325 MG tablet Take 325 mg by mouth daily.    . Azelastine-Fluticasone (DYMISTA) 137-50 MCG/ACT SUSP Place 2 Squirts into the nose daily. In each nostril 23 g 11  . fish oil-omega-3 fatty acids 1000 MG capsule Take 2 g by mouth daily.    Marland Kitchen  glucosamine-chondroitin 500-400 MG tablet Take 1 tablet by mouth 3 (three) times daily.    . INS SYRINGE/NEEDLE 1CC/28G (B-D INSULIN SYRINGE 1CC/28G) 28G X 1/2" 1 ML MISC 1 Syringe by Does not apply route daily after supper. 100 each 0  . Multiple Vitamins-Minerals (MULTIVITAMIN WITH MINERALS) tablet Take 1 tablet by mouth daily.    . Potassium 99 MG TABS Take 1 tablet by mouth daily.    . sildenafil (VIAGRA) 100 MG tablet Take 1 tablet (100 mg total) by mouth daily as needed for erectile dysfunction. 10 tablet 3  . testosterone cypionate (DEPOTESTOTERONE CYPIONATE) 200 MG/ML injection Inject 1 mL (200 mg total) into the muscle every 14 (fourteen) days. 10 mL 3  . amLODipine (NORVASC) 10 MG tablet TAKE ONE TABLET BY MOUTH ONCE DAILY 90 tablet 3  . glipiZIDE (GLUCOTROL) 10 MG tablet TAKE ONE TABLET BY MOUTH TWICE DAILY BEFORE MEAL(S) 60 tablet 5  . meloxicam (MOBIC) 15 MG tablet TAKE ONE TABLET BY MOUTH ONCE DAILY 90 tablet 1  . metFORMIN (GLUCOPHAGE) 500 MG tablet Take 1 tablet (500 mg total) by mouth 2 (two) times daily with a meal. 180 tablet 1  . pravastatin (PRAVACHOL) 40 MG tablet TAKE ONE TABLET BY MOUTH ONCE DAILY 90 tablet 0  . sitaGLIPtin (JANUVIA) 100 MG tablet Take 1 tablet (100 mg total) by mouth daily. 30 tablet 12  . traMADol (ULTRAM) 50 MG tablet TAKE ONE TABLET BY  MOUTH EVERY 6 HOURS AS NEEDED FOR PAIN 120 tablet 0  . lisinopril-hydrochlorothiazide (ZESTORETIC) 20-12.5 MG per tablet Take 1 tablet by mouth daily. (Patient not taking: Reported on 01/26/2015) 90 tablet 3   No facility-administered medications prior to visit.    Review of Systems;  Patient denies headache, fevers, malaise, unintentional weight loss, skin rash, eye pain, sinus congestion and sinus pain, sore throat, dysphagia,  hemoptysis , cough, dyspnea, wheezing, chest pain, palpitations, orthopnea, edema, abdominal pain, nausea, melena, diarrhea, constipation, flank pain, dysuria, hematuria, urinary  Frequency,  nocturia, numbness, tingling, seizures,  Focal weakness, Loss of consciousness,  Tremor, insomnia, depression, anxiety, and suicidal ideation.      Objective:  BP 154/78 mmHg  Pulse 93  Temp(Src) 98.1 F (36.7 C) (Oral)  Resp 12  Ht 6\' 2"  (1.88 m)  Wt 314 lb 8 oz (142.656 kg)  BMI 40.36 kg/m2  SpO2 97%  BP Readings from Last 3 Encounters:  01/26/15 154/78  11/18/14 132/64  10/13/14 120/76    Wt Readings from Last 3 Encounters:  01/26/15 314 lb 8 oz (142.656 kg)  11/18/14 315 lb 3.2 oz (142.974 kg)  10/13/14 308 lb 12 oz (140.048 kg)    General appearance: alert, cooperative and appears stated age Ears: normal TM's and external ear canals both ears Throat: lips, mucosa, and tongue normal; teeth and gums normal Neck: no adenopathy, no carotid bruit, supple, symmetrical, trachea midline and thyroid not enlarged, symmetric, no tenderness/mass/nodules Back: symmetric, no curvature. ROM normal. No CVA tenderness. Lungs: clear to auscultation bilaterally Heart: regular rate and rhythm, S1, S2 normal, no murmur, click, rub or gallop Abdomen: soft, non-tender; bowel sounds normal; no masses,  no organomegaly Pulses: 2+ and symmetric Skin: Skin color, texture, turgor normal. No rashes or lesions Lymph nodes: Cervical, supraclavicular, and axillary nodes normal.  Lab Results  Component Value Date   HGBA1C 8.0* 01/26/2015   HGBA1C 7.0* 10/13/2014   HGBA1C 6.7* 07/04/2014    Lab Results  Component Value Date   CREATININE 0.90 01/26/2015   CREATININE 0.90 10/13/2014   CREATININE 0.87 07/04/2014    Lab Results  Component Value Date   WBC 7.5 10/31/2013   HGB 12.6* 10/31/2013   HCT 37.8* 10/31/2013   PLT 189.0 10/31/2013   GLUCOSE 266* 01/26/2015   CHOL 175 01/26/2015   TRIG 248.0* 01/26/2015   HDL 39.30 01/26/2015   LDLDIRECT 96.0 01/26/2015   LDLCALC 74 07/04/2014   ALT 61* 01/26/2015   AST 32 01/26/2015   NA 144 01/26/2015   K 4.2 01/26/2015   CL 107 01/26/2015    CREATININE 0.90 01/26/2015   BUN 19 01/26/2015   CO2 19 01/26/2015   TSH 0.83 10/31/2013   PSA 0.59 11/01/2012   HGBA1C 8.0* 01/26/2015   MICROALBUR 1.7 10/13/2014    No results found.  Assessment & Plan:   Problem List Items Addressed This Visit    Hypertension    Elevated today on current regimen, no changes today. He will check his BP at home over the next several weeks and report readings to me.   Lab Results  Component Value Date   CREATININE 0.90 01/26/2015   Lab Results  Component Value Date   NA 144 01/26/2015   K 4.2 01/26/2015   CL 107 01/26/2015   CO2 19 01/26/2015           Relevant Medications   amLODipine (NORVASC) 10 MG tablet   pravastatin (PRAVACHOL) 40 MG tablet   lisinopril-hydrochlorothiazide (ZESTORETIC)  20-12.5 MG tablet   Diabetes mellitus with neuropathy (HCC)    Loss of control noted with repeat A1c.  Will increase metformin to 1000 mg bid , continue glipizide 10 mg bid and Januvia 100 mg daily .  Exercise and low GI diet advised.   Lab Results  Component Value Date   HGBA1C 8.0* 01/26/2015   Lab Results  Component Value Date   MICROALBUR 1.7 10/13/2014         Relevant Medications   glipiZIDE (GLUCOTROL) 10 MG tablet   pravastatin (PRAVACHOL) 40 MG tablet   sitaGLIPtin (JANUVIA) 100 MG tablet   lisinopril-hydrochlorothiazide (ZESTORETIC) 20-12.5 MG tablet   metFORMIN (GLUCOPHAGE) 1000 MG tablet   Other Relevant Orders   Comprehensive metabolic panel (Completed)   Hemoglobin A1c (Completed)   LDL cholesterol, direct (Completed)   Lipid panel (Completed)   Hyperlipidemia    Tolerating  pravastatin . HDL is improved,  trigs are mildly elevated due to indulgent eating . ALT is elevated   Lab Results  Component Value Date   CHOL 175 01/26/2015   HDL 39.30 01/26/2015   LDLCALC 74 07/04/2014   LDLDIRECT 96.0 01/26/2015   TRIG 248.0* 01/26/2015   CHOLHDL 4 01/26/2015   Lab Results  Component Value Date   ALT 61*  01/26/2015   AST 32 01/26/2015   ALKPHOS 69 01/26/2015   BILITOT 0.4 01/26/2015               Relevant Medications   amLODipine (NORVASC) 10 MG tablet   pravastatin (PRAVACHOL) 40 MG tablet   lisinopril-hydrochlorothiazide (ZESTORETIC) 20-12.5 MG tablet   DJD (degenerative joint disease) of knee - Primary    Aggravated yesterday by extended labor.  Using ice and tramadol .  Started osteobiflex/msm  last week  Has had injections ,  Last one Synvisc, done at [place],  Several years ago,  lasted a year       Relevant Medications   meloxicam (MOBIC) 15 MG tablet   traMADol (ULTRAM) 50 MG tablet   Other Relevant Orders   Ambulatory referral to Orthopedic Surgery    Other Visit Diagnoses    Diabetes mellitus without complication ([place])        Relevant Medications    glipiZIDE (GLUCOTROL) 10 MG tablet    pravastatin (PRAVACHOL) 40 MG tablet    sitaGLIPtin (JANUVIA) 100 MG tablet    lisinopril-hydrochlorothiazide (ZESTORETIC) 20-12.5 MG tablet    metFORMIN (GLUCOPHAGE) 1000 MG tablet    Uncontrolled type 2 diabetes mellitus with diabetic nephropathy, with long-term current use of insulin (HCC)        Relevant Medications    glipiZIDE (GLUCOTROL) 10 MG tablet    pravastatin (PRAVACHOL) 40 MG tablet    sitaGLIPtin (JANUVIA) 100 MG tablet    lisinopril-hydrochlorothiazide (ZESTORETIC) 20-12.5 MG tablet    metFORMIN (GLUCOPHAGE) 1000 MG tablet       I have discontinued Mr. Dube's metFORMIN. I have also changed his meloxicam, traMADol, amLODipine, pravastatin, lisinopril-hydrochlorothiazide, and metFORMIN. Additionally, I am having him maintain his multivitamin with minerals, vitamin C, fish oil-omega-3 fatty acids, glucosamine-chondroitin, sildenafil, INS SYRINGE/NEEDLE 1CC/28G, aspirin, Azelastine-Fluticasone, testosterone cypionate, Potassium, ALPRAZolam, glipiZIDE, and sitaGLIPtin.  Meds ordered this encounter  Medications  . meloxicam (MOBIC) 15 MG tablet    Sig: Take 1  tablet (15 mg total) by mouth daily.    Dispense:  90 tablet    Refill:  1  . traMADol (ULTRAM) 50 MG tablet    Sig: Take 1 tablet (50 mg  total) by mouth every 6 (six) hours as needed. for pain    Dispense:  120 tablet    Refill:  0  . amLODipine (NORVASC) 10 MG tablet    Sig: Take 1 tablet (10 mg total) by mouth daily.    Dispense:  90 tablet    Refill:  3  . glipiZIDE (GLUCOTROL) 10 MG tablet    Sig: TAKE ONE TABLET BY MOUTH TWICE DAILY BEFORE MEAL(S)    Dispense:  60 tablet    Refill:  5  . DISCONTD: metFORMIN (GLUCOPHAGE) 500 MG tablet    Sig: Take 1 tablet (500 mg total) by mouth 2 (two) times daily with a meal.    Dispense:  180 tablet    Refill:  1  . pravastatin (PRAVACHOL) 40 MG tablet    Sig: Take 1 tablet (40 mg total) by mouth daily.    Dispense:  90 tablet    Refill:  1  . sitaGLIPtin (JANUVIA) 100 MG tablet    Sig: Take 1 tablet (100 mg total) by mouth daily.    Dispense:  30 tablet    Refill:  5  . lisinopril-hydrochlorothiazide (ZESTORETIC) 20-12.5 MG tablet    Sig: Take 1 tablet by mouth daily.    Dispense:  90 tablet    Refill:  3  . metFORMIN (GLUCOPHAGE) 1000 MG tablet    Sig: Take 1 tablet (1,000 mg total) by mouth 2 (two) times daily with a meal.    Dispense:  180 tablet    Refill:  1    Medications Discontinued During This Encounter  Medication Reason  . meloxicam (MOBIC) 15 MG tablet Reorder  . traMADol (ULTRAM) 50 MG tablet Reorder  . amLODipine (NORVASC) 10 MG tablet Reorder  . glipiZIDE (GLUCOTROL) 10 MG tablet Reorder  . metFORMIN (GLUCOPHAGE) 500 MG tablet Reorder  . pravastatin (PRAVACHOL) 40 MG tablet Reorder  . sitaGLIPtin (JANUVIA) 100 MG tablet Reorder  . lisinopril-hydrochlorothiazide (ZESTORETIC) 20-12.5 MG per tablet Reorder  . metFORMIN (GLUCOPHAGE) 500 MG tablet Reorder    Follow-up: No Follow-up on file.   Crecencio Mc, MD

## 2015-01-26 NOTE — Progress Notes (Signed)
Pre-visit discussion using our clinic review tool. No additional management support is needed unless otherwise documented below in the visit note.  

## 2015-01-27 ENCOUNTER — Encounter: Payer: Self-pay | Admitting: Internal Medicine

## 2015-01-27 ENCOUNTER — Other Ambulatory Visit: Payer: Self-pay | Admitting: Internal Medicine

## 2015-01-27 DIAGNOSIS — R748 Abnormal levels of other serum enzymes: Secondary | ICD-10-CM | POA: Insufficient documentation

## 2015-01-27 DIAGNOSIS — E1140 Type 2 diabetes mellitus with diabetic neuropathy, unspecified: Secondary | ICD-10-CM

## 2015-01-27 MED ORDER — METFORMIN HCL 1000 MG PO TABS: 1000.0000 mg | ORAL_TABLET | Freq: Two times a day (BID) | ORAL | Status: DC

## 2015-01-27 NOTE — Assessment & Plan Note (Signed)
Tolerating  pravastatin . HDL is improved,  trigs are mildly elevated due to indulgent eating . ALT is elevated   Lab Results  Component Value Date   CHOL 175 01/26/2015   HDL 39.30 01/26/2015   LDLCALC 74 07/04/2014   LDLDIRECT 96.0 01/26/2015   TRIG 248.0* 01/26/2015   CHOLHDL 4 01/26/2015   Lab Results  Component Value Date   ALT 61* 01/26/2015   AST 32 01/26/2015   ALKPHOS 69 01/26/2015   BILITOT 0.4 01/26/2015

## 2015-01-27 NOTE — Assessment & Plan Note (Signed)
Elevated today on current regimen, no changes today. He will check his BP at home over the next several weeks and report readings to me.   Lab Results  Component Value Date   CREATININE 0.90 01/26/2015   Lab Results  Component Value Date   NA 144 01/26/2015   K 4.2 01/26/2015   CL 107 01/26/2015   CO2 19 01/26/2015

## 2015-01-27 NOTE — Assessment & Plan Note (Addendum)
Loss of control noted with repeat A1c.  Will increase metformin to 1000 mg bid , continue glipizide 10 mg bid and Januvia 100 mg daily .  Exercise and low GI diet advised.   Lab Results  Component Value Date   HGBA1C 8.0* 01/26/2015   Lab Results  Component Value Date   MICROALBUR 1.7 10/13/2014

## 2015-01-30 ENCOUNTER — Encounter: Payer: Self-pay | Admitting: Internal Medicine

## 2015-01-30 ENCOUNTER — Telehealth: Payer: Self-pay

## 2015-01-30 MED ORDER — SAXAGLIPTIN HCL 5 MG PO TABS: 5.0000 mg | ORAL_TABLET | Freq: Every day | ORAL | Status: DC

## 2015-01-30 NOTE — Telephone Encounter (Signed)
Patient notified

## 2015-01-30 NOTE — Telephone Encounter (Signed)
Started PA for [place] on cover my meds.

## 2015-01-30 NOTE — Addendum Note (Signed)
Addended by: Crecencio Mc on: 01/30/2015 04:49 PM   Modules accepted: Orders

## 2015-01-30 NOTE — Telephone Encounter (Signed)
rx sent for onglyza,  Corena Pilgrim.  To [facility]

## 2015-01-30 NOTE — Telephone Encounter (Signed)
[employer] denied PA for [place] stating we must try Onglyza first please advise.

## 2015-01-31 ENCOUNTER — Other Ambulatory Visit: Payer: Self-pay | Admitting: Internal Medicine

## 2015-01-31 DIAGNOSIS — R748 Abnormal levels of other serum enzymes: Secondary | ICD-10-CM

## 2015-02-03 LAB — HM DIABETES EYE EXAM

## 2015-02-05 ENCOUNTER — Ambulatory Visit
Admission: RE | Admit: 2015-02-05 | Discharge: 2015-02-05 | Disposition: A | Discharge status: Home / Self Care | Payer: BLUE CROSS/BLUE SHIELD | Source: Ambulatory Visit | Attending: Internal Medicine | Admitting: Internal Medicine

## 2015-02-05 ENCOUNTER — Encounter: Payer: Self-pay | Admitting: Internal Medicine

## 2015-02-05 DIAGNOSIS — E785 Hyperlipidemia, unspecified: Secondary | ICD-10-CM | POA: Insufficient documentation

## 2015-02-05 DIAGNOSIS — R748 Abnormal levels of other serum enzymes: Secondary | ICD-10-CM | POA: Insufficient documentation

## 2015-02-05 DIAGNOSIS — K7689 Other specified diseases of liver: Secondary | ICD-10-CM | POA: Diagnosis not present

## 2015-02-05 DIAGNOSIS — M1710 Unilateral primary osteoarthritis, unspecified knee: Secondary | ICD-10-CM

## 2015-02-05 DIAGNOSIS — E669 Obesity, unspecified: Secondary | ICD-10-CM | POA: Insufficient documentation

## 2015-02-05 DIAGNOSIS — E119 Type 2 diabetes mellitus without complications: Secondary | ICD-10-CM | POA: Insufficient documentation

## 2015-02-05 DIAGNOSIS — M179 Osteoarthritis of knee, unspecified: Secondary | ICD-10-CM

## 2015-02-06 ENCOUNTER — Encounter: Payer: Self-pay | Admitting: Internal Medicine

## 2015-04-13 DIAGNOSIS — Z85828 Personal history of other malignant neoplasm of skin: Secondary | ICD-10-CM | POA: Diagnosis not present

## 2015-05-04 ENCOUNTER — Encounter: Payer: Self-pay | Admitting: Internal Medicine

## 2015-05-04 ENCOUNTER — Ambulatory Visit (INDEPENDENT_AMBULATORY_CARE_PROVIDER_SITE_OTHER): Payer: BLUE CROSS/BLUE SHIELD | Admitting: Internal Medicine

## 2015-05-04 DIAGNOSIS — R748 Abnormal levels of other serum enzymes: Secondary | ICD-10-CM

## 2015-05-04 DIAGNOSIS — E1140 Type 2 diabetes mellitus with diabetic neuropathy, unspecified: Secondary | ICD-10-CM

## 2015-05-04 DIAGNOSIS — E785 Hyperlipidemia, unspecified: Secondary | ICD-10-CM | POA: Diagnosis not present

## 2015-05-04 LAB — FERRITIN: Ferritin: 577.7 ng/mL — ABNORMAL HIGH (ref 22.0–322.0)

## 2015-05-04 LAB — LIPID PANEL
CHOL/HDL RATIO: 4
CHOLESTEROL: 169 mg/dL (ref 0–200)
HDL: 39.7 mg/dL (ref >39.00)
NonHDL: 129.7
TRIGLYCERIDES: 223 mg/dL — AB (ref 0.0–149.0)
VLDL: 44.6 mg/dL — ABNORMAL HIGH (ref 0.0–40.0)

## 2015-05-04 LAB — COMPREHENSIVE METABOLIC PANEL
ALBUMIN: 4.5 g/dL (ref 3.5–5.2)
ALK PHOS: 59 U/L (ref 39–117)
ALT: 50 U/L (ref 0–53)
AST: 24 U/L (ref 0–37)
BILIRUBIN TOTAL: 0.6 mg/dL (ref 0.2–1.2)
BUN: 15 mg/dL (ref 6–23)
CO2: 26 mEq/L (ref 19–32)
CREATININE: 0.92 mg/dL (ref 0.40–1.50)
Calcium: 9.7 mg/dL (ref 8.4–10.5)
Chloride: 101 mEq/L (ref 96–112)
GFR: 90.02 mL/min (ref >60.00)
GLUCOSE: 230 mg/dL — AB (ref 70–99)
Potassium: 4 mEq/L (ref 3.5–5.1)
SODIUM: 137 meq/L (ref 135–145)
TOTAL PROTEIN: 7.3 g/dL (ref 6.0–8.3)

## 2015-05-04 LAB — LDL CHOLESTEROL, DIRECT: LDL DIRECT: 98 mg/dL

## 2015-05-04 LAB — HEMOGLOBIN A1C: HEMOGLOBIN A1C: 7.7 % — AB (ref 4.6–6.5)

## 2015-05-04 NOTE — Progress Notes (Signed)
Pre-visit discussion using our clinic review tool. No additional management support is needed unless otherwise documented below in the visit note.  

## 2015-05-04 NOTE — Patient Instructions (Signed)
Send me a log of your fasting blood sugars for the next 2 weeks   To make a low carb chip :  Take the Joseph's Lavash or Pita bread,  Or the [place] Low carb whole wheat tortilla   Place on metal cookie sheet  Brush with olive oil  Sprinkle garlic powder (NOT garlic salt), grated parmesan cheese, mediterranean seasoning , or all of them?  Bake at 225 or 250 for 90 minutes   We have substitutions for your potatoes!!  Try the mashed cauliflower and riced cauliflower dishes instead of rice and mashed potatoes [employer]   Mashed turnips are also very low carb!   For dessert:  Try the Dannon Lt n Fit greek yogurt dessert flavors and top with reddi Whip .  8 carbs,  80 calories  Try Oikos Triple Zero [place] Yogurt in the salted caramel, and the coffee flavors  With Whipped Cream for dessert

## 2015-05-04 NOTE — Progress Notes (Signed)
Subjective:  Patient ID: David Pineda., male    DOB: 1955-06-25  Age: 60 y.o. MRN: [id number]  CC: The primary encounter diagnosis was Obesity, morbid, BMI 40.0-49.9 ([place]). Diagnoses of Elevated liver enzymes, Type 2 diabetes mellitus with diabetic neuropathy, without long-term current use of insulin ([place]), and Hyperlipidemia were also pertinent to this visit.  HPI David Pineda. presents for diabetes follow up.  At his last visit he was prescribed Onglyza but stopped it after one dose because he developed immediate tremors. Only took one dose,  Does not want to try it again.  Has been trying to eat better.  More fruits and veggies. Eating fewer starches, but still eating whole wheat.  [facility] fired so he stopped going. Not checking sugars.  His knee pain  Has improved with Synvisc. Injections in both knees by Rogers Blocker at [facility].      Lab Results  Component Value Date   HGBA1C 7.7* 05/04/2015     Outpatient Prescriptions Prior to Visit  Medication Sig Dispense Refill  . amLODipine (NORVASC) 10 MG tablet Take 1 tablet (10 mg total) by mouth daily. 90 tablet 3  . Ascorbic Acid (VITAMIN C) 100 MG tablet Take 100 mg by mouth daily.    Marland Kitchen aspirin 325 MG tablet Take 325 mg by mouth daily.    . Azelastine-Fluticasone (DYMISTA) 137-50 MCG/ACT SUSP Place 2 Squirts into the nose daily. In each nostril 23 g 11  . glipiZIDE (GLUCOTROL) 10 MG tablet TAKE ONE TABLET BY MOUTH TWICE DAILY BEFORE MEAL(S) 60 tablet 5  . glucosamine-chondroitin 500-400 MG tablet Take 1 tablet by mouth 3 (three) times daily.    . INS SYRINGE/NEEDLE 1CC/28G (B-D INSULIN SYRINGE 1CC/28G) 28G X 1/2" 1 ML MISC 1 Syringe by Does not apply route daily after supper. 100 each 0  . lisinopril-hydrochlorothiazide (ZESTORETIC) 20-12.5 MG tablet Take 1 tablet by mouth daily. 90 tablet 3  . meloxicam (MOBIC) 15 MG tablet Take 1 tablet (15 mg total) by mouth daily. 90 tablet 1  .  metFORMIN (GLUCOPHAGE) 1000 MG tablet Take 1 tablet (1,000 mg total) by mouth 2 (two) times daily with a meal. 180 tablet 1  . Potassium 99 MG TABS Take 1 tablet by mouth daily.    . pravastatin (PRAVACHOL) 40 MG tablet Take 1 tablet (40 mg total) by mouth daily. 90 tablet 1  . sildenafil (VIAGRA) 100 MG tablet Take 1 tablet (100 mg total) by mouth daily as needed for erectile dysfunction. 10 tablet 3  . testosterone cypionate (DEPOTESTOTERONE CYPIONATE) 200 MG/ML injection Inject 1 mL (200 mg total) into the muscle every 14 (fourteen) days. 10 mL 3  . traMADol (ULTRAM) 50 MG tablet Take 1 tablet (50 mg total) by mouth every 6 (six) hours as needed. for pain 120 tablet 0  . saxagliptin HCl (ONGLYZA) 5 MG TABS tablet Take 1 tablet (5 mg total) by mouth daily. 90 tablet 1  . ALPRAZolam (XANAX) 0.5 MG tablet Take 1 tablet (0.5 mg total) by mouth at bedtime as needed for anxiety or sleep. (Patient not taking: Reported on 05/04/2015) 30 tablet 3  . fish oil-omega-3 fatty acids 1000 MG capsule Take 2 g by mouth daily. Reported on 05/04/2015    . Multiple Vitamins-Minerals (MULTIVITAMIN WITH MINERALS) tablet Take 1 tablet by mouth daily. Reported on 05/04/2015    . sitaGLIPtin (JANUVIA) 100 MG tablet Take 1 tablet (100 mg total) by  mouth daily. (Patient not taking: Reported on 05/04/2015) 30 tablet 5   No facility-administered medications prior to visit.    Review of Systems;  Patient denies headache, fevers, malaise, unintentional weight loss, skin rash, eye pain, sinus congestion and sinus pain, sore throat, dysphagia,  hemoptysis , cough, dyspnea, wheezing, chest pain, palpitations, orthopnea, edema, abdominal pain, nausea, melena, diarrhea, constipation, flank pain, dysuria, hematuria, urinary  Frequency, nocturia, numbness, tingling, seizures,  Focal weakness, Loss of consciousness,  Tremor, insomnia, depression, anxiety, and suicidal ideation.      Objective:  BP 138/76 mmHg  Pulse 76  Temp(Src)  98.1 F (36.7 C) (Oral)  Resp 12  Ht 6\' 2"  (1.88 m)  Wt 312 lb 12 oz (141.862 kg)  BMI 40.14 kg/m2  SpO2 98%  BP Readings from Last 3 Encounters:  05/04/15 138/76  01/26/15 154/78  11/18/14 132/64    Wt Readings from Last 3 Encounters:  05/04/15 312 lb 12 oz (141.862 kg)  01/26/15 314 lb 8 oz (142.656 kg)  11/18/14 315 lb 3.2 oz (142.974 kg)    General appearance: alert, cooperative and appears stated age Ears: normal TM's and external ear canals both ears Throat: lips, mucosa, and tongue normal; teeth and gums normal Neck: no adenopathy, no carotid bruit, supple, symmetrical, trachea midline and thyroid not enlarged, symmetric, no tenderness/mass/nodules Back: symmetric, no curvature. ROM normal. No CVA tenderness. Lungs: clear to auscultation bilaterally Heart: regular rate and rhythm, S1, S2 normal, no murmur, click, rub or gallop Abdomen: soft, non-tender; bowel sounds normal; no masses,  no organomegaly Pulses: 2+ and symmetric Skin: Skin color, texture, turgor normal. No rashes or lesions Lymph nodes: Cervical, supraclavicular, and axillary nodes normal.  Lab Results  Component Value Date   HGBA1C 7.7* 05/04/2015   HGBA1C 8.0* 01/26/2015   HGBA1C 7.0* 10/13/2014    Lab Results  Component Value Date   CREATININE 0.92 05/04/2015   CREATININE 0.90 01/26/2015   CREATININE 0.90 10/13/2014    Lab Results  Component Value Date   WBC 7.5 10/31/2013   HGB 12.6* 10/31/2013   HCT 37.8* 10/31/2013   PLT 189.0 10/31/2013   GLUCOSE 230* 05/04/2015   CHOL 169 05/04/2015   TRIG 223.0* 05/04/2015   HDL 39.70 05/04/2015   LDLDIRECT 98.0 05/04/2015   LDLCALC 74 07/04/2014   ALT 50 05/04/2015   AST 24 05/04/2015   NA 137 05/04/2015   K 4.0 05/04/2015   CL 101 05/04/2015   CREATININE 0.92 05/04/2015   BUN 15 05/04/2015   CO2 26 05/04/2015   TSH 0.83 10/31/2013   PSA 0.59 11/01/2012   HGBA1C 7.7* 05/04/2015   MICROALBUR 1.7 10/13/2014    US Abdomen  Complete  02/05/2015  CLINICAL DATA:  Elevated liver enzymes. Diabetes, obesity, and hyperlipidemia. EXAM: ABDOMEN ULTRASOUND COMPLETE COMPARISON:  None. FINDINGS: Technically challenging/limited study due to patient body habitus. Gallbladder: No gallstones or wall thickening visualized. No sonographic Murphy sign noted by sonographer. Common bile duct: Diameter: 5 mm Liver: Moderately increased echogenicity diffusely. 1.4 x 1.5 x 1.5 cm hypoechoic focus adjacent to the gallbladder in the right hepatic lobe likely reflecting focal fatty sparing. 2.1 x 1.1 x 1.6 cm hyppo- to anechoic lesion with increased through transmission in the right hepatic lobe and without evidence of internal vascularity, likely a cyst. IVC: No abnormality visualized. Pancreas: Visualized portion unremarkable. Spleen: Size and appearance within normal limits. Right Kidney: Length: 13.1 cm. Echogenicity within normal limits. No mass or hydronephrosis visualized. Left Kidney: Length: 12.8 cm.  Echogenicity within normal limits. No mass or hydronephrosis visualized. Abdominal aorta: Suboptimally visualized.  No aneurysm identified. Other findings: None. IMPRESSION: 1. Increased liver echogenicity likely reflecting steatosis. Focal fatty sparing adjacent to the gallbladder. 2.1 cm right hepatic cyst. 2. Otherwise negative examination. Electronically Signed   By: Logan Bores M.D.   On: 02/05/2015 09:21    Assessment & Plan:   Problem List Items Addressed This Visit    Obesity, morbid, BMI 40.0-49.9 ([place]) - Primary    I have addressed  BMI and recommended a low glycemic index diet utilizing smaller more frequent meals to increase metabolism.  I have also recommended that patient start exercising with a goal of 30 minutes of aerobic exercise a minimum of 5 days per week.       Diabetes mellitus with neuropathy ([facility])    Slight improvement in control noted with repeat A1c. She is taking maximal doses of metformin and glipizide.  Will  discuss adding Victoza.   Lab Results  Component Value Date   HGBA1C 7.7* 05/04/2015   Lab Results  Component Value Date   MICROALBUR 1.7 10/13/2014           Hyperlipidemia    Tolerating  pravastatin . HDL is improved,  trigs are mildly elevated due to indulgent eating .   Lab Results  Component Value Date   CHOL 169 05/04/2015   HDL 39.70 05/04/2015   LDLCALC 74 07/04/2014   LDLDIRECT 98.0 05/04/2015   TRIG 223.0* 05/04/2015   CHOLHDL 4 05/04/2015   Lab Results  Component Value Date   ALT 50 05/04/2015   AST 24 05/04/2015   ALKPHOS 59 05/04/2015   BILITOT 0.6 05/04/2015                 Elevated liver enzymes    Resolved on repeat..  Suspect fatty liver.   Lab Results  Component Value Date   ALT 50 05/04/2015   AST 24 05/04/2015   ALKPHOS 59 05/04/2015   BILITOT 0.6 05/04/2015            I have discontinued Mr. Brasington's sitaGLIPtin and saxagliptin HCl. I am also having him maintain his multivitamin with minerals, vitamin C, fish oil-omega-3 fatty acids, glucosamine-chondroitin, sildenafil, INS SYRINGE/NEEDLE 1CC/28G, aspirin, Azelastine-Fluticasone, testosterone cypionate, Potassium, ALPRAZolam, meloxicam, traMADol, amLODipine, glipiZIDE, pravastatin, lisinopril-hydrochlorothiazide, and metFORMIN.  No orders of the defined types were placed in this encounter.    Medications Discontinued During This Encounter  Medication Reason  . saxagliptin HCl (ONGLYZA) 5 MG TABS tablet   . sitaGLIPtin (JANUVIA) 100 MG tablet     Follow-up: Return in about 3 months (around 08/03/2015) for follow up diabetes.   Crecencio Mc, MD

## 2015-05-05 LAB — IRON AND TIBC
%SAT: 23 % (ref 15–60)
IRON: 83 ug/dL (ref 50–180)
TIBC: 359 ug/dL (ref 250–425)
UIBC: 276 ug/dL (ref 125–400)

## 2015-05-05 LAB — HEPATITIS B CORE ANTIBODY, TOTAL: HEP B C TOTAL AB: NONREACTIVE

## 2015-05-05 LAB — ANA: Anti Nuclear Antibody(ANA): NEGATIVE

## 2015-05-05 LAB — HEPATITIS C ANTIBODY: HCV Ab: NEGATIVE

## 2015-05-05 LAB — ANTI-SMITH ANTIBODY: ENA SM AB SER-ACNC: NEGATIVE

## 2015-05-05 NOTE — Assessment & Plan Note (Addendum)
Slight improvement in control noted with repeat A1c. She is taking maximal doses of metformin and glipizide.  Will discuss adding Victoza.   Lab Results  Component Value Date   HGBA1C 7.7* 05/04/2015   Lab Results  Component Value Date   MICROALBUR 1.7 10/13/2014

## 2015-05-05 NOTE — Assessment & Plan Note (Signed)
I have addressed  BMI and recommended a low glycemic index diet utilizing smaller more frequent meals to increase metabolism.  I have also recommended that patient start exercising with a goal of 30 minutes of aerobic exercise a minimum of 5 days per week.  

## 2015-05-05 NOTE — Assessment & Plan Note (Signed)
Resolved on repeat..  Suspect fatty liver.   Lab Results  Component Value Date   ALT 50 05/04/2015   AST 24 05/04/2015   ALKPHOS 59 05/04/2015   BILITOT 0.6 05/04/2015

## 2015-05-05 NOTE — Assessment & Plan Note (Signed)
Tolerating  pravastatin . HDL is improved,  trigs are mildly elevated due to indulgent eating .   Lab Results  Component Value Date   CHOL 169 05/04/2015   HDL 39.70 05/04/2015   LDLCALC 74 07/04/2014   LDLDIRECT 98.0 05/04/2015   TRIG 223.0* 05/04/2015   CHOLHDL 4 05/04/2015   Lab Results  Component Value Date   ALT 50 05/04/2015   AST 24 05/04/2015   ALKPHOS 59 05/04/2015   BILITOT 0.6 05/04/2015

## 2015-05-06 ENCOUNTER — Encounter: Payer: Self-pay | Admitting: Internal Medicine

## 2015-09-22 ENCOUNTER — Encounter: Payer: Self-pay | Admitting: Internal Medicine

## 2015-09-22 ENCOUNTER — Ambulatory Visit (INDEPENDENT_AMBULATORY_CARE_PROVIDER_SITE_OTHER): Payer: BLUE CROSS/BLUE SHIELD | Admitting: Internal Medicine

## 2015-09-22 VITALS — BP 122/60 | HR 73 | Temp 98.0°F | Ht 74.0 in | Wt 303.0 lb

## 2015-09-22 DIAGNOSIS — M25522 Pain in left elbow: Secondary | ICD-10-CM

## 2015-09-22 DIAGNOSIS — M7702 Medial epicondylitis, left elbow: Secondary | ICD-10-CM

## 2015-09-22 DIAGNOSIS — E0840 Diabetes mellitus due to underlying condition with diabetic neuropathy, unspecified: Secondary | ICD-10-CM

## 2015-09-22 DIAGNOSIS — Z23 Encounter for immunization: Secondary | ICD-10-CM

## 2015-09-22 DIAGNOSIS — M545 Low back pain, unspecified: Secondary | ICD-10-CM

## 2015-09-22 DIAGNOSIS — I10 Essential (primary) hypertension: Secondary | ICD-10-CM

## 2015-09-22 LAB — COMPREHENSIVE METABOLIC PANEL
ALT: 18 U/L (ref 0–53)
AST: 18 U/L (ref 0–37)
Albumin: 4.7 g/dL (ref 3.5–5.2)
Alkaline Phosphatase: 42 U/L (ref 39–117)
BUN: 24 mg/dL — AB (ref 6–23)
CHLORIDE: 104 meq/L (ref 96–112)
CO2: 30 meq/L (ref 19–32)
CREATININE: 1.01 mg/dL (ref 0.40–1.50)
Calcium: 9.5 mg/dL (ref 8.4–10.5)
GFR: 80.72 mL/min (ref >60.00)
Glucose, Bld: 97 mg/dL (ref 70–99)
Potassium: 3.8 mEq/L (ref 3.5–5.1)
SODIUM: 137 meq/L (ref 135–145)
Total Bilirubin: 0.6 mg/dL (ref 0.2–1.2)
Total Protein: 7.5 g/dL (ref 6.0–8.3)

## 2015-09-22 LAB — HEMOGLOBIN A1C: HEMOGLOBIN A1C: 5.5 % (ref 4.6–6.5)

## 2015-09-22 NOTE — Patient Instructions (Addendum)
You are doing very well  With lifestyle changes.  I am making the referral to Marylee Floras for your elbow and back pain .

## 2015-09-22 NOTE — Progress Notes (Signed)
Subjective:  Patient ID: David Pineda., male    DOB: 30-May-1955  Age: 60 y.o. MRN: [id number]  CC: The primary encounter diagnosis was Diabetes mellitus due to underlying condition with diabetic neuropathy, without long-term current use of insulin ([place]). Diagnoses of Left elbow pain, Midline low back pain without sciatica, Encounter for immunization, Essential hypertension, Obesity, morbid, BMI 40.0-49.9 ([place]), and Medial epicondylitis of left elbow were also pertinent to this visit.  HPI David Pineda. presents for follow up on type 2 DM, hypertension  complicated by obesity and OA of knees  .    HHe has lost 9 lbs since last visit,  12 lbs by his scale .  Has been limiting starches and processed sugars,  And working out at the gym with a trainer [employer]   2-3 days per week .     BS readings reviewed.  fastings up to 160  Lowest has been 95.  Dinner is Architect.  Snacks on almonds.  By [place] Iha Knee pain: receiving synvisc injections every 6 months.  Considering knee replacement.    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 ALPRAZolam (XANAX) 0.5 MG tablet Take 1 tablet (0.5 mg total) by mouth at bedtime as needed for anxiety or sleep. 30 tablet 3  . amLODipine (NORVASC) 10 MG tablet Take 1 tablet (10 mg total) by mouth daily. 90 tablet 3  . Ascorbic Acid (VITAMIN C) 100 MG tablet Take 100 mg by mouth daily.    Marland Kitchen aspirin 325 MG tablet Take 325 mg by mouth daily.    . Azelastine-Fluticasone (DYMISTA) 137-50 MCG/ACT SUSP Place 2 Squirts into the nose daily. In each nostril 23 g 11  . fish oil-omega-3 fatty acids 1000 MG  capsule Take 2 g by mouth daily. Reported on 05/04/2015    . glipiZIDE (GLUCOTROL) 10 MG tablet TAKE ONE TABLET BY MOUTH TWICE DAILY BEFORE MEAL(S) 60 tablet 5  . glucosamine-chondroitin 500-400 MG tablet Take 1 tablet by mouth 3 (three) times daily.    . INS SYRINGE/NEEDLE 1CC/28G (B-D INSULIN SYRINGE 1CC/28G) 28G X 1/2" 1 ML MISC 1 Syringe by Does not apply route daily after supper. 100 each 0  . lisinopril-hydrochlorothiazide (ZESTORETIC) 20-12.5 MG tablet Take 1 tablet by mouth daily. 90 tablet 3  . meloxicam (MOBIC) 15 MG tablet Take 1 tablet (15 mg total) by mouth daily. 90 tablet 1  . metFORMIN (GLUCOPHAGE) 1000 MG tablet Take 1 tablet (1,000 mg total) by mouth 2 (two) times daily with a meal. 180 tablet 1  . Multiple Vitamins-Minerals (MULTIVITAMIN WITH MINERALS) tablet Take 1 tablet by mouth daily. Reported on 05/04/2015    . Potassium 99 MG TABS Take 1 tablet by mouth daily.    . pravastatin (PRAVACHOL) 40 MG tablet Take 1 tablet (40 mg total) by mouth daily. 90 tablet 1  . sildenafil (VIAGRA) 100 MG tablet Take 1 tablet (100 mg total) by mouth daily as needed for erectile dysfunction. 10 tablet 3  . testosterone cypionate (DEPOTESTOTERONE CYPIONATE) 200 MG/ML injection Inject 1 mL (200 mg total) into the muscle every 14 (  fourteen) days. 10 mL 3  . traMADol (ULTRAM) 50 MG tablet Take 1 tablet (50 mg total) by mouth every 6 (six) hours as needed. for pain 120 tablet 0   No facility-administered medications prior to visit.     Review of Systems;  Patient denies headache, fevers, malaise, unintentional weight loss, skin rash, eye pain, sinus congestion and sinus pain, sore throat, dysphagia,  hemoptysis , cough, dyspnea, wheezing, chest pain, palpitations, orthopnea, edema, abdominal pain, nausea, melena, diarrhea, constipation, flank pain, dysuria, hematuria, urinary  Frequency, nocturia, numbness, tingling, seizures,  Focal weakness, Loss of consciousness,  Tremor, insomnia, depression,  anxiety, and suicidal ideation.      Objective:  BP 122/60   Pulse 73   Temp 98 F (36.7 C) (Oral)   Ht 6\' 2"  (1.88 m)   Wt (!) 303 lb (137.4 kg)   SpO2 96%   BMI 38.90 kg/m   BP Readings from Last 3 Encounters:  09/22/15 122/60  05/04/15 138/76  01/26/15 (!) 154/78    Wt Readings from Last 3 Encounters:  09/22/15 (!) 303 lb (137.4 kg)  05/04/15 (!) 312 lb 12 oz (141.9 kg)  01/26/15 (!) 314 lb 8 oz (142.7 kg)    General appearance: alert, cooperative and appears stated age Ears: normal TM's and external ear canals both ears Throat: lips, mucosa, and tongue normal; teeth and gums normal Neck: no adenopathy, no carotid bruit, supple, symmetrical, trachea midline and thyroid not enlarged, symmetric, no tenderness/mass/nodules Back: symmetric, no curvature. ROM normal. No CVA tenderness. Lungs: clear to auscultation bilaterally Heart: regular rate and rhythm, S1, S2 normal, no murmur, click, rub or gallop Abdomen: soft, non-tender; bowel sounds normal; no masses,  no organomegaly Pulses: 2+ and symmetric Skin: Skin color, texture, turgor normal. No rashes or lesions Lymph nodes: Cervical, supraclavicular, and axillary nodes normal.  Lab Results  Component Value Date   HGBA1C 5.5 09/22/2015   HGBA1C 7.7 (H) 05/04/2015   HGBA1C 8.0 (H) 01/26/2015    Lab Results  Component Value Date   CREATININE 1.01 09/22/2015   CREATININE 0.92 05/04/2015   CREATININE 0.90 01/26/2015    Lab Results  Component Value Date   WBC 7.5 10/31/2013   HGB 12.6 (L) 10/31/2013   HCT 37.8 (L) 10/31/2013   PLT 189.0 10/31/2013   GLUCOSE 97 09/22/2015   CHOL 136 09/22/2015   TRIG 136 09/22/2015   HDL 36 (L) 09/22/2015   LDLDIRECT 98.0 05/04/2015   LDLCALC 73 09/22/2015   ALT 18 09/22/2015   AST 18 09/22/2015   NA 137 09/22/2015   K 3.8 09/22/2015   CL 104 09/22/2015   CREATININE 1.01 09/22/2015   BUN 24 (H) 09/22/2015   CO2 30 09/22/2015   TSH 0.83 10/31/2013   PSA 0.59  11/01/2012   HGBA1C 5.5 09/22/2015   MICROALBUR 0.9 09/22/2015    US Abdomen Complete  Result Date: 02/05/2015 CLINICAL DATA:  Elevated liver enzymes. Diabetes, obesity, and hyperlipidemia. EXAM: ABDOMEN ULTRASOUND COMPLETE COMPARISON:  None. FINDINGS: Technically challenging/limited study due to patient body habitus. Gallbladder: No gallstones or wall thickening visualized. No sonographic Murphy sign noted by sonographer. Common bile duct: Diameter: 5 mm Liver: Moderately increased echogenicity diffusely. 1.4 x 1.5 x 1.5 cm hypoechoic focus adjacent to the gallbladder in the right hepatic lobe likely reflecting focal fatty sparing. 2.1 x 1.1 x 1.6 cm hyppo- to anechoic lesion with increased through transmission in the right hepatic lobe and without evidence of internal vascularity, likely a cyst. IVC: No  abnormality visualized. Pancreas: Visualized portion unremarkable. Spleen: Size and appearance within normal limits. Right Kidney: Length: 13.1 cm. Echogenicity within normal limits. No mass or hydronephrosis visualized. Left Kidney: Length: 12.8 cm. Echogenicity within normal limits. No mass or hydronephrosis visualized. Abdominal aorta: Suboptimally visualized.  No aneurysm identified. Other findings: None. IMPRESSION: 1. Increased liver echogenicity likely reflecting steatosis. Focal fatty sparing adjacent to the gallbladder. 2.1 cm right hepatic cyst. 2. Otherwise negative examination. Electronically Signed   By: Logan Bores M.D.   On: 02/05/2015 09:21    Assessment & Plan:   Problem List Items Addressed This Visit    Hypertension    Improved with exercise .  No changes today.      Obesity, morbid, BMI 40.0-49.9 ([place])    I have congratulated her in reduction of   BMI and encouraged  Continued weight loss with goal of 10% of body weigh over the next 6 months using a low glycemic index diet and regular exercise a minimum of 5 days per week.        Diabetes mellitus with neuropathy ([place]) -  Primary    Significant improvement with lifestyle changes including  regular exercise and low glycemic index diet. No changes today   Lab Results  Component Value Date   HGBA1C 5.5 09/22/2015         Relevant Orders   Hemoglobin A1c (Completed)   Comprehensive metabolic panel (Completed)   Lipid Panel w/o Chol/HDL Ratio (Completed)   Microalbumin / creatinine urine ratio (Completed)   Medial epicondylitis of left elbow    Referral to Orthopedics for magnagement.  Secondary to bench pressing       Other Visit Diagnoses    Left elbow pain       Midline low back pain without sciatica       Relevant Orders   Ambulatory referral to Orthopedic Surgery   Encounter for immunization       Relevant Orders   Flu Vaccine QUAD 36+ mos IM (Completed)      I am having Mr. Marquina maintain his multivitamin with minerals, vitamin C, fish oil-omega-3 fatty acids, glucosamine-chondroitin, sildenafil, INS SYRINGE/NEEDLE 1CC/28G, aspirin, Azelastine-Fluticasone, testosterone cypionate, Potassium, ALPRAZolam, meloxicam, traMADol, amLODipine, glipiZIDE, pravastatin, lisinopril-hydrochlorothiazide, and metFORMIN.  No orders of the defined types were placed in this encounter.   There are no discontinued medications.  Follow-up: No Follow-up on file.   Crecencio Mc, MD

## 2015-09-22 NOTE — Progress Notes (Signed)
Pre visit review using our clinic review tool, if applicable. No additional management support is needed unless otherwise documented below in the visit note. 

## 2015-09-23 DIAGNOSIS — M7702 Medial epicondylitis, left elbow: Secondary | ICD-10-CM | POA: Insufficient documentation

## 2015-09-23 LAB — LIPID PANEL W/O CHOL/HDL RATIO
Cholesterol, Total: 136 mg/dL (ref 100–199)
HDL: 36 mg/dL — ABNORMAL LOW (ref >39)
LDL CALC: 73 mg/dL (ref 0–99)
Triglycerides: 136 mg/dL (ref 0–149)
VLDL Cholesterol Cal: 27 mg/dL (ref 5–40)

## 2015-09-23 LAB — MICROALBUMIN / CREATININE URINE RATIO
Creatinine,U: 163.5 mg/dL
Microalb Creat Ratio: 0.6 mg/g (ref 0.0–30.0)
Microalb, Ur: 0.9 mg/dL (ref 0.0–1.9)

## 2015-09-23 NOTE — Assessment & Plan Note (Signed)
Referral to Orthopedics for magnagement.  Secondary to bench pressing

## 2015-09-23 NOTE — Assessment & Plan Note (Signed)
I have congratulated her in reduction of   BMI and encouraged  Continued weight loss with goal of 10% of body weigh over the next 6 months using a low glycemic index diet and regular exercise a minimum of 5 days per week.    

## 2015-09-23 NOTE — Assessment & Plan Note (Addendum)
Significant improvement with lifestyle changes including  regular exercise and low glycemic index diet. No changes today   Lab Results  Component Value Date   HGBA1C 5.5 09/22/2015

## 2015-09-23 NOTE — Assessment & Plan Note (Signed)
Improved with exercise .  No changes today.

## 2015-09-24 ENCOUNTER — Encounter: Payer: Self-pay | Admitting: Internal Medicine

## 2015-10-06 ENCOUNTER — Other Ambulatory Visit: Payer: Self-pay | Admitting: Internal Medicine

## 2015-10-06 MED ORDER — TESTOSTERONE CYPIONATE 200 MG/ML IM SOLN: 200.0000 mg | INTRAMUSCULAR | 3 refills | Status: DC

## 2015-10-06 NOTE — Telephone Encounter (Signed)
Refilled 01/26/15. Pt last seen 09/22/15. Please advise?

## 2015-10-06 NOTE — Telephone Encounter (Signed)
Refilled 03/24/14. Pt last seen 09/22/15. Please advise?

## 2015-10-06 NOTE — Telephone Encounter (Signed)
Pt called about needing a refill for testosterone cypionate (DEPOTESTOTERONE CYPIONATE) 200 MG/ML injection.   Pharmacy is [id number] - [place], [place]  Call pt @ [phone]. Thank you!

## 2015-10-08 NOTE — Telephone Encounter (Signed)
Mailed unread message to patient. thanks 

## 2015-10-20 DIAGNOSIS — M545 Low back pain: Secondary | ICD-10-CM | POA: Diagnosis not present

## 2015-11-18 ENCOUNTER — Encounter: Payer: Self-pay | Admitting: Family Medicine

## 2015-11-18 ENCOUNTER — Ambulatory Visit
Admission: RE | Admit: 2015-11-18 | Discharge: 2015-11-18 | Disposition: A | Discharge status: Home / Self Care | Payer: BLUE CROSS/BLUE SHIELD | Source: Ambulatory Visit | Attending: Family Medicine | Admitting: Family Medicine

## 2015-11-18 ENCOUNTER — Ambulatory Visit (INDEPENDENT_AMBULATORY_CARE_PROVIDER_SITE_OTHER): Payer: BLUE CROSS/BLUE SHIELD | Admitting: Family Medicine

## 2015-11-18 VITALS — BP 138/76 | HR 71 | Temp 98.1°F | Wt 299.2 lb

## 2015-11-18 DIAGNOSIS — R938 Abnormal findings on diagnostic imaging of other specified body structures: Secondary | ICD-10-CM | POA: Insufficient documentation

## 2015-11-18 DIAGNOSIS — N50819 Testicular pain, unspecified: Secondary | ICD-10-CM

## 2015-11-18 NOTE — Patient Instructions (Signed)
Nice to meet you. Your discomfort could be related to a muscle strain or testicular issue. We are going to obtain an ultrasound of the testicles to evaluate this further. If you develop worsening pain, or develop fevers, or any new or changing symptoms please seek medical attention immediately.

## 2015-11-18 NOTE — Progress Notes (Signed)
  Tommi Rumps, MD Phone: [phone]  Mikle Deneault. is a 60 y.o. male who presents today for same-day visit.  Patient notes over the last couple of days he has noted discomfort in his right groin near his right testicle. Notes in the right testicle he gets a mild discomfort though most of the discomfort is proximal in his inguinal area. He notes no injury. No bulging in this area. Notes mild discomfort in the testicle though he notes no masses in the testicle. Notes if he moves a certain way it hurts. Does note his hip popped last week. He notes no numbness, weakness, loss of bowel or bladder function, saddle anesthesia, or fevers. He has taken tramadol with minimal benefit.   ROS see history of present illness  Objective  Physical Exam Vitals:   11/18/15 1016  BP: 138/76  Pulse: 71  Temp: 98.1 F (36.7 C)    BP Readings from Last 3 Encounters:  11/18/15 138/76  09/22/15 122/60  05/04/15 138/76   Wt Readings from Last 3 Encounters:  11/18/15 299 lb 3.2 oz (135.7 kg)  09/22/15 (!) 303 lb (137.4 kg)  05/04/15 (!) 312 lb 12 oz (141.9 kg)    Physical Exam  Constitutional: He is well-developed, well-nourished, and in no distress.  Cardiovascular: Normal rate, regular rhythm and normal heart sounds.   Pulmonary/Chest: Effort normal and breath sounds normal.  Genitourinary: Penis normal.  Genitourinary Comments: Normal scrotum, mild discomfort on palpation of the right testicle with no abnormalities palpated of the testicle or epididymis or vas deferens, no abnormalities or tenderness of the left testicle, vas deferens, or epididymis, no inguinal hernias palpated, no lymphadenopathy in bilateral inguinal areas were no muscular tenderness in the inguinal area or medial aspect of bilateral thighs     Assessment/Plan: Please see individual problem list.  Testicular discomfort Patient with several days of discomfort in his right testicle and inguinal area. Mild tenderness  on exam of right testicle though no other abnormalities noted. We will obtain ultrasound of his testicles to evaluate for abnormalities. If negative could be muscle strain or less likely hernia. Once ultrasound returns we will determine next step in management.   Orders Placed This Encounter  Procedures  . [place] Art/Ven Flow Abd Pelv Doppler    Standing Status:   Future    Number of Occurrences:   1    Standing Expiration Date:   01/17/2017    Order Specific Question:   Reason for Exam (SYMPTOM  OR DIAGNOSIS REQUIRED)    Answer:   right sided testicular discomfort and inguinal discomfort for 2 days    Order Specific Question:   Preferred imaging location?    Answer:   [facility]    Order Specific Question:   Call Results- Best Contact Number?    AnswerCB:[id number]  . US Scrotum    Standing Status:   Future    Number of Occurrences:   1    Standing Expiration Date:   01/17/2017    Order Specific Question:   Reason for Exam (SYMPTOM  OR DIAGNOSIS REQUIRED)    Answer:   right sided testicular discomfort and inguinal discomfort for 2 days    Order Specific Question:   Preferred imaging location?    Answer:   [facility]    Order Specific Question:   Call Results- Best Contact Number?    Answer:   [place], MD [place]

## 2015-11-18 NOTE — Assessment & Plan Note (Signed)
Patient with several days of discomfort in his right testicle and inguinal area. Mild tenderness on exam of right testicle though no other abnormalities noted. We will obtain ultrasound of his testicles to evaluate for abnormalities. If negative could be muscle strain or less likely hernia. Once ultrasound returns we will determine next step in management.

## 2015-11-18 NOTE — Progress Notes (Signed)
Pre visit review using our clinic review tool, if applicable. No additional management support is needed unless otherwise documented below in the visit note. 

## 2015-11-20 ENCOUNTER — Telehealth: Payer: Self-pay | Admitting: Family Medicine

## 2015-11-20 NOTE — Telephone Encounter (Signed)
Called and spoke with patient regarding ultrasound results of his testicles. There is a single microcalcification in the right testicle. He reports no history of testicular cancer. No history of undescended testicles. He is adopted so doesn't know his family history. Per review of the radiologist report it appears that it would be reasonable for him to perform self exams monthly and have a yearly exam with a physician to monitor for any changes. He reports mild continued discomfort in the groin area. No discomfort in his testicle currently. He has been taking tramadol and anti-inflammatories intermittently. We discussed having him continue to monitor and if not improving by next week he will give [place] a call to determine next step in evaluation.

## 2015-11-27 DIAGNOSIS — G4733 Obstructive sleep apnea (adult) (pediatric): Secondary | ICD-10-CM | POA: Diagnosis not present

## 2015-12-08 ENCOUNTER — Telehealth: Payer: Self-pay | Admitting: Internal Medicine

## 2015-12-08 MED ORDER — "SYRINGE 20G X 1"" 3 ML MISC": 1.0000 | 1 refills | Status: DC

## 2015-12-08 NOTE — Telephone Encounter (Signed)
Pt called and needs the needles in order to give himself the Testerone shots. Please advise, thank you!  Pharmacy - [facility] [place] [place] [address]  Call pt @ [phone]

## 2015-12-09 NOTE — Telephone Encounter (Signed)
Medication filled.  

## 2015-12-14 DIAGNOSIS — Z85828 Personal history of other malignant neoplasm of skin: Secondary | ICD-10-CM | POA: Diagnosis not present

## 2015-12-18 DIAGNOSIS — M170 Bilateral primary osteoarthritis of knee: Secondary | ICD-10-CM | POA: Diagnosis not present

## 2015-12-25 DIAGNOSIS — M170 Bilateral primary osteoarthritis of knee: Secondary | ICD-10-CM | POA: Diagnosis not present

## 2016-01-01 DIAGNOSIS — M170 Bilateral primary osteoarthritis of knee: Secondary | ICD-10-CM | POA: Diagnosis not present

## 2016-01-12 DIAGNOSIS — E119 Type 2 diabetes mellitus without complications: Secondary | ICD-10-CM | POA: Diagnosis not present

## 2016-01-12 DIAGNOSIS — H52223 Regular astigmatism, bilateral: Secondary | ICD-10-CM | POA: Diagnosis not present

## 2016-01-12 DIAGNOSIS — E089 Diabetes mellitus due to underlying condition without complications: Secondary | ICD-10-CM | POA: Diagnosis not present

## 2016-01-12 LAB — HM DIABETES EYE EXAM

## 2016-01-19 ENCOUNTER — Other Ambulatory Visit: Payer: Self-pay | Admitting: Internal Medicine

## 2016-01-28 ENCOUNTER — Ambulatory Visit: Payer: BLUE CROSS/BLUE SHIELD | Admitting: Internal Medicine

## 2016-02-29 ENCOUNTER — Telehealth: Payer: Self-pay | Admitting: Internal Medicine

## 2016-02-29 NOTE — Telephone Encounter (Signed)
Pt called wanting to inform that [facility] now will be using [facility] in [place], [place]. Thank you!

## 2016-03-08 ENCOUNTER — Telehealth: Payer: Self-pay | Admitting: *Deleted

## 2016-03-08 MED ORDER — LISINOPRIL-HYDROCHLOROTHIAZIDE 20-12.5 MG PO TABS: 1.0000 | ORAL_TABLET | Freq: Every day | ORAL | 0 refills | Status: DC

## 2016-03-08 MED ORDER — AMLODIPINE BESYLATE 10 MG PO TABS: 10.0000 mg | ORAL_TABLET | Freq: Every day | ORAL | 0 refills | Status: DC

## 2016-03-08 MED ORDER — GLIPIZIDE 10 MG PO TABS: ORAL_TABLET | ORAL | 0 refills | Status: DC

## 2016-03-08 MED ORDER — MELOXICAM 15 MG PO TABS: 15.0000 mg | ORAL_TABLET | Freq: Every day | ORAL | 0 refills | Status: DC

## 2016-03-08 NOTE — Telephone Encounter (Signed)
Refills sent. Pt informed.  

## 2016-03-08 NOTE — Telephone Encounter (Signed)
[facility] [place]  [facility] on any active Rx. [facility], however all of the pt's Rx were not received.

## 2016-03-08 NOTE — Telephone Encounter (Signed)
Pt called back and stated that he wants to go back to [facility] since he is having so much trouble. He needs amLODipine (NORVASC) 10 MG tablet, lisinopril-hydrochlorothiazide (ZESTORETIC) 20-12.5 MG tablet, and glipiZIDE (GLUCOTROL) 10 MG tablet, meloxicam (MOBIC) 15 MG tablet. He only has a couple of these left. Pt is very frustrated and would like this to be handled today.Please call pt when completed. Please advise, thank you!  Call pt @ [phone]  Pharmacy - [id number] - [place], [place]

## 2016-04-28 ENCOUNTER — Ambulatory Visit (INDEPENDENT_AMBULATORY_CARE_PROVIDER_SITE_OTHER): Payer: BLUE CROSS/BLUE SHIELD | Admitting: Internal Medicine

## 2016-04-28 ENCOUNTER — Encounter: Payer: Self-pay | Admitting: Internal Medicine

## 2016-04-28 VITALS — BP 130/72 | HR 70 | Temp 98.2°F | Resp 14 | Wt 296.5 lb

## 2016-04-28 DIAGNOSIS — E559 Vitamin D deficiency, unspecified: Secondary | ICD-10-CM | POA: Diagnosis not present

## 2016-04-28 DIAGNOSIS — R7989 Other specified abnormal findings of blood chemistry: Secondary | ICD-10-CM | POA: Diagnosis not present

## 2016-04-28 DIAGNOSIS — L989 Disorder of the skin and subcutaneous tissue, unspecified: Secondary | ICD-10-CM

## 2016-04-28 DIAGNOSIS — E782 Mixed hyperlipidemia: Secondary | ICD-10-CM | POA: Diagnosis not present

## 2016-04-28 DIAGNOSIS — E785 Hyperlipidemia, unspecified: Secondary | ICD-10-CM

## 2016-04-28 DIAGNOSIS — E0840 Diabetes mellitus due to underlying condition with diabetic neuropathy, unspecified: Secondary | ICD-10-CM

## 2016-04-28 DIAGNOSIS — Z79899 Other long term (current) drug therapy: Secondary | ICD-10-CM

## 2016-04-28 DIAGNOSIS — I10 Essential (primary) hypertension: Secondary | ICD-10-CM

## 2016-04-28 DIAGNOSIS — G4733 Obstructive sleep apnea (adult) (pediatric): Secondary | ICD-10-CM

## 2016-04-28 DIAGNOSIS — E1169 Type 2 diabetes mellitus with other specified complication: Secondary | ICD-10-CM | POA: Diagnosis not present

## 2016-04-28 DIAGNOSIS — B351 Tinea unguium: Secondary | ICD-10-CM | POA: Diagnosis not present

## 2016-04-28 DIAGNOSIS — E349 Endocrine disorder, unspecified: Secondary | ICD-10-CM

## 2016-04-28 DIAGNOSIS — Z9989 Dependence on other enabling machines and devices: Secondary | ICD-10-CM

## 2016-04-28 LAB — LIPID PANEL
CHOLESTEROL: 142 mg/dL (ref 0–200)
HDL: 40.1 mg/dL (ref >39.00)
LDL Cholesterol: 81 mg/dL (ref 0–99)
NonHDL: 102.05
Total CHOL/HDL Ratio: 4
Triglycerides: 104 mg/dL (ref 0.0–149.0)
VLDL: 20.8 mg/dL (ref 0.0–40.0)

## 2016-04-28 LAB — COMPREHENSIVE METABOLIC PANEL
ALK PHOS: 39 U/L (ref 39–117)
ALT: 18 U/L (ref 0–53)
AST: 19 U/L (ref 0–37)
Albumin: 4.8 g/dL (ref 3.5–5.2)
BILIRUBIN TOTAL: 0.7 mg/dL (ref 0.2–1.2)
BUN: 23 mg/dL (ref 6–23)
CALCIUM: 9.9 mg/dL (ref 8.4–10.5)
CO2: 29 mEq/L (ref 19–32)
Chloride: 103 mEq/L (ref 96–112)
Creatinine, Ser: 1.1 mg/dL (ref 0.40–1.50)
GFR: 72.99 mL/min (ref >60.00)
Glucose, Bld: 135 mg/dL — ABNORMAL HIGH (ref 70–99)
Potassium: 3.9 mEq/L (ref 3.5–5.1)
Sodium: 140 mEq/L (ref 135–145)
Total Protein: 7.5 g/dL (ref 6.0–8.3)

## 2016-04-28 LAB — CBC WITH DIFFERENTIAL/PLATELET
BASOS ABS: 0 10*3/uL (ref 0.0–0.1)
Basophils Relative: 0.3 % (ref 0.0–3.0)
EOS ABS: 0.1 10*3/uL (ref 0.0–0.7)
Eosinophils Relative: 1.4 % (ref 0.0–5.0)
HCT: 45.9 % (ref 39.0–52.0)
Hemoglobin: 15.5 g/dL (ref 13.0–17.0)
LYMPHS ABS: 1.9 10*3/uL (ref 0.7–4.0)
Lymphocytes Relative: 20.3 % (ref 12.0–46.0)
MCHC: 33.9 g/dL (ref 30.0–36.0)
MCV: 84.8 fl (ref 78.0–100.0)
MONOS PCT: 11.5 % (ref 3.0–12.0)
Monocytes Absolute: 1.1 10*3/uL — ABNORMAL HIGH (ref 0.1–1.0)
NEUTROS PCT: 66.5 % (ref 43.0–77.0)
Neutro Abs: 6.4 10*3/uL (ref 1.4–7.7)
Platelets: 214 10*3/uL (ref 150.0–400.0)
RBC: 5.41 Mil/uL (ref 4.22–5.81)
RDW: 14.8 % (ref 11.5–15.5)
WBC: 9.6 10*3/uL (ref 4.0–10.5)

## 2016-04-28 LAB — POCT GLYCOSYLATED HEMOGLOBIN (HGB A1C): HEMOGLOBIN A1C: 5.7

## 2016-04-28 LAB — VITAMIN D 25 HYDROXY (VIT D DEFICIENCY, FRACTURES): VITD: 16.87 ng/mL — ABNORMAL LOW (ref 30.00–100.00)

## 2016-04-28 NOTE — Progress Notes (Signed)
Pre visit review using our clinic review tool, if applicable. No additional management support is needed unless otherwise documented below in the visit note. 

## 2016-04-28 NOTE — Patient Instructions (Addendum)
Your diabetes remains under excellent control  .  I think you should reduce the glipizide to 1/2 tablet twice daily  ( 5mg  twice daily) to prevent hypoglycemia.   your blood pressure is  above the currently recommended acceptable standards of 120/70, so if your home readings are above that as well,,  I recommend  that you double your   Lisinopril hydrochlorthiazide to lower it, and let me know so I can send  it to your pharmacy   Dermatology referral in process.  Keep that finger padded to prevent rebleeding

## 2016-04-28 NOTE — Progress Notes (Signed)
Subjective:  Patient ID: David Pineda., male    DOB: Jul 28, 1955  Age: 61 y.o. MRN: [id number]  CC: The primary encounter diagnosis was Skin lesion of left upper extremity. Diagnoses of Diabetes mellitus due to underlying condition with diabetic neuropathy, without long-term current use of insulin ([place]), Low testosterone level in male, Hyperlipidemia associated with type 2 diabetes mellitus ([place]), Long-term use of high-risk medication, Vitamin D deficiency, Hypotestosteronism, Onychomycosis of toenail, Mixed hyperlipidemia, Essential hypertension, OSA on CPAP, and Obesity, morbid, BMI 40.0-49.9 ([place]) were also pertinent to this visit.  HPI David Pineda. presents for 6 month follow up on diabetes.  Patient has no complaints today.  Patient is following a low glycemic index diet and taking all prescribed medications regularly without side effects.  Fasting sugars have been under less than 140 most of the time and post prandials have been under 160 except on rare occasions. Patient is exercising about 3 times per week WITH A TRAINER and intentionally trying to lose weight .  Patient has had an eye exam in the last 12 months and checks feet regularly for signs of infection.  Patient does not walk barefoot outside,  And denies anY numbness tingling or burning in feet. Patient is up to date on all recommended vaccinations  Needs testosterone level checked  Last dose 200 mg was 4  days ago.  Working out with a [employer]    Normal eye exam Jan 2018 eye center  Using cpap every night minimum of 6 hours   Outpatient Medications Prior to Visit  Medication Sig Dispense Refill  . amLODipine (NORVASC) 10 MG tablet Take 1 tablet (10 mg total) by mouth daily. 90 tablet 0  . Ascorbic Acid (VITAMIN C) 100 MG tablet Take 100 mg by mouth daily.    Marland Kitchen aspirin 325 MG tablet Take 325 mg by mouth daily.    . Azelastine-Fluticasone (DYMISTA) 137-50 MCG/ACT SUSP Place 2 Squirts into the nose daily. In each  nostril 23 g 11  . fish oil-omega-3 fatty acids 1000 MG capsule Take 2 g by mouth daily. Reported on 05/04/2015    . glipiZIDE (GLUCOTROL) 10 MG tablet TAKE ONE TABLET BY MOUTH TWICE DAILY BEFORE MEAL(S) 180 tablet 0  . glucosamine-chondroitin 500-400 MG tablet Take 1 tablet by mouth 3 (three) times daily.    . INS SYRINGE/NEEDLE 1CC/28G (B-D INSULIN SYRINGE 1CC/28G) 28G X 1/2" 1 ML MISC 1 Syringe by Does not apply route daily after supper. 100 each 0  . lisinopril-hydrochlorothiazide (ZESTORETIC) 20-12.5 MG tablet Take 1 tablet by mouth daily. 90 tablet 0  . meloxicam (MOBIC) 15 MG tablet Take 1 tablet (15 mg total) by mouth daily. 90 tablet 0  . metFORMIN (GLUCOPHAGE) 1000 MG tablet Take 1 tablet (1,000 mg total) by mouth 2 (two) times daily with a meal. 180 tablet 1  . Multiple Vitamins-Minerals (MULTIVITAMIN WITH MINERALS) tablet Take 1 tablet by mouth daily. Reported on 05/04/2015    . Potassium 99 MG TABS Take 1 tablet by mouth daily.    . pravastatin (PRAVACHOL) 40 MG tablet TAKE 1 TABLET BY MOUTH DAILY 90 tablet 1  . sildenafil (VIAGRA) 100 MG tablet Take 1 tablet (100 mg total) by mouth daily as needed for erectile dysfunction. 10 tablet 3  . Syringe/Needle, Disp, (SYRINGE 3CC/20GX1") 20G X 1" 3 ML MISC 1 Syringe by Does not apply route once a week. 24 each 1  . testosterone cypionate (DEPOTESTOSTERONE CYPIONATE) 200 MG/ML injection Inject 1 mL (200 mg  total) into the muscle every 14 (fourteen) days. 10 mL 3  . traMADol (ULTRAM) 50 MG tablet Take 1 tablet (50 mg total) by mouth every 6 (six) hours as needed. for pain 120 tablet 0  . ALPRAZolam (XANAX) 0.5 MG tablet Take 1 tablet (0.5 mg total) by mouth at bedtime as needed for anxiety or sleep. (Patient not taking: Reported on 04/28/2016) 30 tablet 3   No facility-administered medications prior to visit.     Review of Systems;  Patient denies headache, fevers, malaise, unintentional weight loss, skin rash, eye pain, sinus congestion and  sinus pain, sore throat, dysphagia,  hemoptysis , cough, dyspnea, wheezing, chest pain, palpitations, orthopnea, edema, abdominal pain, nausea, melena, diarrhea, constipation, flank pain, dysuria, hematuria, urinary  Frequency, nocturia, numbness, tingling, seizures,  Focal weakness, Loss of consciousness,  Tremor, insomnia, depression, anxiety, and suicidal ideation.      Objective:  BP 130/72 (BP Location: Left Arm, Patient Position: Sitting, Cuff Size: Large)   Pulse 70   Temp 98.2 F (36.8 C) (Oral)   Resp 14   Wt 296 lb 8 oz (134.5 kg)   SpO2 96%   BMI 38.07 kg/m   BP Readings from Last 3 Encounters:  04/28/16 130/72  11/18/15 138/76  09/22/15 122/60    Wt Readings from Last 3 Encounters:  04/28/16 296 lb 8 oz (134.5 kg)  11/18/15 299 lb 3.2 oz (135.7 kg)  09/22/15 (!) 303 lb (137.4 kg)    General appearance: alert, cooperative and appears stated age Ears: normal TM's and external ear canals both ears Throat: lips, mucosa, and tongue normal; teeth and gums normal Neck: no adenopathy, no carotid bruit, supple, symmetrical, trachea midline and thyroid not enlarged, symmetric, no tenderness/mass/nodules Back: symmetric, no curvature. ROM normal. No CVA tenderness. Lungs: clear to auscultation bilaterally Heart: regular rate and rhythm, S1, S2 normal, no murmur, click, rub or gallop Abdomen: soft, non-tender; bowel sounds normal; no masses,  no organomegaly Pulses: 2+ and symmetric Skin: Skin color, texture, turgor normal. No rashes or lesions Lymph nodes: Cervical, supraclavicular, and axillary nodes normal.  Lab Results  Component Value Date   HGBA1C 5.7 04/28/2016   HGBA1C 5.5 09/22/2015   HGBA1C 7.7 (H) 05/04/2015    Lab Results  Component Value Date   CREATININE 1.10 04/28/2016   CREATININE 1.01 09/22/2015   CREATININE 0.92 05/04/2015    Lab Results  Component Value Date   WBC 9.6 04/28/2016   HGB 15.5 04/28/2016   HCT 45.9 04/28/2016   PLT 214.0  04/28/2016   GLUCOSE 135 (H) 04/28/2016   CHOL 142 04/28/2016   TRIG 104.0 04/28/2016   HDL 40.10 04/28/2016   LDLDIRECT 98.0 05/04/2015   LDLCALC 81 04/28/2016   ALT 18 04/28/2016   AST 19 04/28/2016   NA 140 04/28/2016   K 3.9 04/28/2016   CL 103 04/28/2016   CREATININE 1.10 04/28/2016   BUN 23 04/28/2016   CO2 29 04/28/2016   TSH 0.83 10/31/2013   PSA 0.59 11/01/2012   HGBA1C 5.7 04/28/2016   MICROALBUR 0.9 09/22/2015    US Scrotum  Result Date: 11/18/2015 CLINICAL DATA:  Testicular discomfort EXAM: SCROTAL ULTRASOUND DOPPLER ULTRASOUND OF THE TESTICLES TECHNIQUE: Complete ultrasound examination of the testicles, epididymis, and other scrotal structures was performed. Color and spectral Doppler ultrasound were also utilized to evaluate blood flow to the testicles. COMPARISON:  None. FINDINGS: Right testicle Measurements: 3.4 x 2.2 x 2.3 cm. Single microcalcification identified. No mass Left testicle Measurements: 3.2 x  2.6 x 2.4 cm. No mass or microlithiasis visualized. Right epididymis:  Normal in size and appearance. Left epididymis:  Normal in size and appearance. Hydrocele:  None visualized. Varicocele:  None visualized. Pulsed Doppler interrogation of both testes demonstrates normal low resistance arterial and venous waveforms bilaterally. IMPRESSION: 1. No evidence for testicular mass or torsion. 2. Single microcalcification within the right testis. Current literature suggests that testicular microlithiasis is not a significant independent risk factor for development of testicular carcinoma, and that follow up imaging is not warranted in the absence of other risk factors. Monthly testicular self-examination and annual physical exams are considered appropriate surveillance. If patient has other risk factors for testicular carcinoma, then referral to Urology should be considered. (Reference: DeCastro, et al.: A 5-Year Follow up Study of Asymptomatic Men with Testicular Microlithiasis. J  Urol 2008; 595:6387-5643.) Electronically Signed   By: Kerby Moors M.D.   On: 11/18/2015 12:28   [place] Art/ven Flow Abd Pelv Doppler  Result Date: 11/18/2015 CLINICAL DATA:  Testicular discomfort EXAM: SCROTAL ULTRASOUND DOPPLER ULTRASOUND OF THE TESTICLES TECHNIQUE: Complete ultrasound examination of the testicles, epididymis, and other scrotal structures was performed. Color and spectral Doppler ultrasound were also utilized to evaluate blood flow to the testicles. COMPARISON:  None. FINDINGS: Right testicle Measurements: 3.4 x 2.2 x 2.3 cm. Single microcalcification identified. No mass Left testicle Measurements: 3.2 x 2.6 x 2.4 cm. No mass or microlithiasis visualized. Right epididymis:  Normal in size and appearance. Left epididymis:  Normal in size and appearance. Hydrocele:  None visualized. Varicocele:  None visualized. Pulsed Doppler interrogation of both testes demonstrates normal low resistance arterial and venous waveforms bilaterally. IMPRESSION: 1. No evidence for testicular mass or torsion. 2. Single microcalcification within the right testis. Current literature suggests that testicular microlithiasis is not a significant independent risk factor for development of testicular carcinoma, and that follow up imaging is not warranted in the absence of other risk factors. Monthly testicular self-examination and annual physical exams are considered appropriate surveillance. If patient has other risk factors for testicular carcinoma, then referral to Urology should be considered. (Reference: DeCastro, et al.: A 5-Year Follow up Study of Asymptomatic Men with Testicular Microlithiasis. J Urol 2008; 329:5188-4166.) Electronically Signed   By: Kerby Moors M.D.   On: 11/18/2015 12:28    Assessment & Plan:   Problem List Items Addressed This Visit    Diabetes mellitus with neuropathy ([place])    Significant improvement with lifestyle changes including  regular exercise and low glycemic index diet. No  changes today   Lab Results  Component Value Date   HGBA1C 5.7 04/28/2016   Lab Results  Component Value Date   HGBA1C 5.7 04/28/2016         Relevant Orders   POCT glycosylated hemoglobin (Hb A1C) (Completed)   Comprehensive metabolic panel (Completed)   Hyperlipidemia    Tolerating  pravastatin . HDL is improved,  trigs are mildly elevated due to indulgent eating .   Lab Results  Component Value Date   CHOL 142 04/28/2016   HDL 40.10 04/28/2016   LDLCALC 81 04/28/2016   LDLDIRECT 98.0 05/04/2015   TRIG 104.0 04/28/2016   CHOLHDL 4 04/28/2016   Lab Results  Component Value Date   ALT 18 04/28/2016   AST 19 04/28/2016   ALKPHOS 39 04/28/2016   BILITOT 0.7 04/28/2016           Tolerating  pravastatin . HDL is improved,  trigs are mildly elevated due to indulgent eating .  Lab Results  Component Value Date   CHOL 142 04/28/2016   HDL 40.10 04/28/2016   LDLCALC 81 04/28/2016   LDLDIRECT 98.0 05/04/2015   TRIG 104.0 04/28/2016   CHOLHDL 4 04/28/2016   Lab Results  Component Value Date   ALT 18 04/28/2016   AST 19 04/28/2016   ALKPHOS 39 04/28/2016   BILITOT 0.7 04/28/2016                 Hypertension    Elevated,  he reports compliance with medication regimen  but has an elevated reading today in office.  He has been asked to check his BP at work and  submit readings for evaluation. Renal function will be checked today.  Lab Results  Component Value Date   CREATININE 1.10 04/28/2016   Lab Results  Component Value Date   NA 140 04/28/2016   K 3.9 04/28/2016   CL 103 04/28/2016   CO2 29 04/28/2016         Hypotestosteronism    Managed with 200 mg IM testosterone every 2 weeks,  Last dose 4 days ago.  Lab Results  Component Value Date   TESTOSTERONE 128 (L) 10/31/2013        Obesity, morbid, BMI 40.0-49.9 ([place])    I have congratulated him in reduction of   BMI and encouraged  Continued weight loss with goal of 10% of body  weigh over the next 6 months using a low glycemic index diet and regular exercise a minimum of 5 days per week.        Onychomycosis of toenail    Improving slowly with use of tea tree oil       OSA on CPAP    Diagnosed by prior sleep study. Patient is using CPAP every night a minimum of 6 hours per night and notes improved daytime wakefulness and decreased fatigue       Skin lesion of left upper extremity - Primary    He has a nodular friable are on his index finger that needs evaluation by dermatology      Relevant Orders   Ambulatory referral to Dermatology    Other Visit Diagnoses    Low testosterone level in male       Relevant Orders   Testos,Total,Free and SHBG (Male)   Hyperlipidemia associated with type 2 diabetes mellitus ([place])       Relevant Orders   Lipid panel (Completed)   Long-term use of high-risk medication       Relevant Orders   CBC with Differential/Platelet (Completed)   Vitamin D deficiency       Relevant Orders   VITAMIN D 25 Hydroxy (Vit-D Deficiency, Fractures) (Completed)      I am having Mr. Carolan maintain his multivitamin with minerals, vitamin C, fish oil-omega-3 fatty acids, glucosamine-chondroitin, sildenafil, INS SYRINGE/NEEDLE 1CC/28G, aspirin, Azelastine-Fluticasone, Potassium, ALPRAZolam, traMADol, metFORMIN, testosterone cypionate, SYRINGE 3CC/20GX1", pravastatin, amLODipine, glipiZIDE, lisinopril-hydrochlorothiazide, and meloxicam.  No orders of the defined types were placed in this encounter.   There are no discontinued medications.  Follow-up: No Follow-up on file.   Crecencio Mc, MD

## 2016-04-30 DIAGNOSIS — L989 Disorder of the skin and subcutaneous tissue, unspecified: Secondary | ICD-10-CM | POA: Insufficient documentation

## 2016-04-30 NOTE — Assessment & Plan Note (Signed)
I have congratulated him in reduction of   BMI and encouraged  Continued weight loss with goal of 10% of body weigh over the next 6 months using a low glycemic index diet and regular exercise a minimum of 5 days per week.   

## 2016-04-30 NOTE — Assessment & Plan Note (Signed)
Improving slowly with use of tea tree oil

## 2016-04-30 NOTE — Assessment & Plan Note (Signed)
Tolerating  pravastatin . HDL is improved,  trigs are mildly elevated due to indulgent eating .   Lab Results  Component Value Date   CHOL 142 04/28/2016   HDL 40.10 04/28/2016   LDLCALC 81 04/28/2016   LDLDIRECT 98.0 05/04/2015   TRIG 104.0 04/28/2016   CHOLHDL 4 04/28/2016   Lab Results  Component Value Date   ALT 18 04/28/2016   AST 19 04/28/2016   ALKPHOS 39 04/28/2016   BILITOT 0.7 04/28/2016           Tolerating  pravastatin . HDL is improved,  trigs are mildly elevated due to indulgent eating .   Lab Results  Component Value Date   CHOL 142 04/28/2016   HDL 40.10 04/28/2016   LDLCALC 81 04/28/2016   LDLDIRECT 98.0 05/04/2015   TRIG 104.0 04/28/2016   CHOLHDL 4 04/28/2016   Lab Results  Component Value Date   ALT 18 04/28/2016   AST 19 04/28/2016   ALKPHOS 39 04/28/2016   BILITOT 0.7 04/28/2016

## 2016-04-30 NOTE — Assessment & Plan Note (Signed)
He has a nodular friable are on his index finger that needs evaluation by dermatology

## 2016-04-30 NOTE — Assessment & Plan Note (Signed)
Diagnosed by prior sleep study. Patient is using CPAP every night a minimum of 6 hours per night and notes improved daytime wakefulness and decreased fatigue  

## 2016-04-30 NOTE — Assessment & Plan Note (Signed)
Significant improvement with lifestyle changes including  regular exercise and low glycemic index diet. No changes today   Lab Results  Component Value Date   HGBA1C 5.7 04/28/2016   Lab Results  Component Value Date   HGBA1C 5.7 04/28/2016

## 2016-04-30 NOTE — Assessment & Plan Note (Signed)
Elevated,  he reports compliance with medication regimen  but has an elevated reading today in office.  He has been asked to check his BP at work and  submit readings for evaluation. Renal function will be checked today.  Lab Results  Component Value Date   CREATININE 1.10 04/28/2016   Lab Results  Component Value Date   NA 140 04/28/2016   K 3.9 04/28/2016   CL 103 04/28/2016   CO2 29 04/28/2016

## 2016-04-30 NOTE — Assessment & Plan Note (Addendum)
Managed with 200 mg IM testosterone every 2 weeks,  Last dose 4 days ago.  Lab Results  Component Value Date   TESTOSTERONE 128 (L) 10/31/2013

## 2016-05-03 ENCOUNTER — Encounter: Payer: Self-pay | Admitting: Internal Medicine

## 2016-05-03 ENCOUNTER — Other Ambulatory Visit: Payer: Self-pay | Admitting: Internal Medicine

## 2016-05-03 LAB — TESTOS,TOTAL,FREE AND SHBG (FEMALE)
Sex Hormone Binding Glob.: 13 nmol/L — ABNORMAL LOW (ref 22–77)
Testosterone, Free: 200.1 pg/mL — ABNORMAL HIGH (ref 35.0–155.0)
Testosterone,Total,LC/MS/MS: 810 ng/dL (ref 250–1100)

## 2016-05-03 MED ORDER — ERGOCALCIFEROL 1.25 MG (50000 UT) PO CAPS: 50000.0000 [IU] | ORAL_CAPSULE | ORAL | 2 refills | Status: DC

## 2016-05-17 ENCOUNTER — Encounter: Payer: Self-pay | Admitting: Internal Medicine

## 2016-05-17 DIAGNOSIS — M25552 Pain in left hip: Secondary | ICD-10-CM

## 2016-05-18 ENCOUNTER — Telehealth: Payer: Self-pay | Admitting: Internal Medicine

## 2016-05-18 ENCOUNTER — Other Ambulatory Visit: Payer: Self-pay | Admitting: Internal Medicine

## 2016-05-18 MED ORDER — TRAMADOL HCL 50 MG PO TABS: 50.0000 mg | ORAL_TABLET | Freq: Four times a day (QID) | ORAL | 0 refills | Status: DC | PRN

## 2016-05-18 NOTE — Telephone Encounter (Signed)
please print the tramadol refill I authorized and

## 2016-05-19 NOTE — Telephone Encounter (Signed)
Signed and faxed

## 2016-05-19 NOTE — Telephone Encounter (Signed)
Printed and placed in quick sign folder 

## 2016-05-27 DIAGNOSIS — D485 Neoplasm of uncertain behavior of skin: Secondary | ICD-10-CM | POA: Diagnosis not present

## 2016-05-27 DIAGNOSIS — L980 Pyogenic granuloma: Secondary | ICD-10-CM | POA: Diagnosis not present

## 2016-06-07 ENCOUNTER — Other Ambulatory Visit: Payer: Self-pay | Admitting: Internal Medicine

## 2016-06-07 NOTE — Telephone Encounter (Signed)
Patient is requesting to have this refilled today, along with needles for injection.  Pt contact [facility]

## 2016-06-17 DIAGNOSIS — M47816 Spondylosis without myelopathy or radiculopathy, lumbar region: Secondary | ICD-10-CM | POA: Diagnosis not present

## 2016-06-17 DIAGNOSIS — M5416 Radiculopathy, lumbar region: Secondary | ICD-10-CM | POA: Diagnosis not present

## 2016-06-17 DIAGNOSIS — M25552 Pain in left hip: Secondary | ICD-10-CM | POA: Diagnosis not present

## 2016-06-20 ENCOUNTER — Other Ambulatory Visit: Payer: Self-pay | Admitting: Orthopedic Surgery

## 2016-06-20 DIAGNOSIS — M47816 Spondylosis without myelopathy or radiculopathy, lumbar region: Secondary | ICD-10-CM

## 2016-06-20 DIAGNOSIS — M25552 Pain in left hip: Secondary | ICD-10-CM

## 2016-06-20 DIAGNOSIS — M5416 Radiculopathy, lumbar region: Secondary | ICD-10-CM

## 2016-06-22 ENCOUNTER — Telehealth: Payer: Self-pay

## 2016-06-22 NOTE — Telephone Encounter (Signed)
LMTCB. Need to ask pt a couple questions about a medication that we need to do a PA on.

## 2016-06-23 NOTE — Telephone Encounter (Signed)
Spoke with pt and he stated that he has been taking the tramadol and he has been able to pick up from the pharmacy. The pt stated that the first initial fill was just a partial fill and then it was approved through his insurance and the was able to get the whole 30 days for the next refill.

## 2016-06-23 NOTE — Telephone Encounter (Signed)
Please call pt after 3 pm , [place]  Pt contact [phone]

## 2016-06-27 DIAGNOSIS — M4726 Other spondylosis with radiculopathy, lumbar region: Secondary | ICD-10-CM | POA: Diagnosis not present

## 2016-06-27 DIAGNOSIS — M5117 Intervertebral disc disorders with radiculopathy, lumbosacral region: Secondary | ICD-10-CM | POA: Diagnosis not present

## 2016-06-27 DIAGNOSIS — M5116 Intervertebral disc disorders with radiculopathy, lumbar region: Secondary | ICD-10-CM | POA: Diagnosis not present

## 2016-06-27 DIAGNOSIS — M4727 Other spondylosis with radiculopathy, lumbosacral region: Secondary | ICD-10-CM | POA: Diagnosis not present

## 2016-07-04 ENCOUNTER — Other Ambulatory Visit: Payer: Self-pay | Admitting: Orthopedic Surgery

## 2016-07-04 DIAGNOSIS — M5416 Radiculopathy, lumbar region: Secondary | ICD-10-CM

## 2016-07-12 ENCOUNTER — Other Ambulatory Visit: Payer: Self-pay | Admitting: Orthopedic Surgery

## 2016-07-12 ENCOUNTER — Ambulatory Visit
Admission: RE | Admit: 2016-07-12 | Discharge: 2016-07-12 | Disposition: A | Discharge status: Home / Self Care | Payer: BLUE CROSS/BLUE SHIELD | Source: Ambulatory Visit | Attending: Orthopedic Surgery | Admitting: Orthopedic Surgery

## 2016-07-12 DIAGNOSIS — M5416 Radiculopathy, lumbar region: Secondary | ICD-10-CM

## 2016-07-12 DIAGNOSIS — M545 Low back pain: Secondary | ICD-10-CM | POA: Diagnosis not present

## 2016-07-12 MED ORDER — IOPAMIDOL (ISOVUE-M 200) INJECTION 41%
1.0000 mL | Freq: Once | INTRAMUSCULAR | Status: AC
  Administered 2016-07-12: 1 mL via EPIDURAL

## 2016-07-12 MED ORDER — METHYLPREDNISOLONE ACETATE 40 MG/ML INJ SUSP (RADIOLOG
120.0000 mg | Freq: Once | INTRAMUSCULAR | Status: AC
  Administered 2016-07-12: 120 mg via EPIDURAL

## 2016-07-12 NOTE — Discharge Instructions (Signed)

## 2016-07-28 ENCOUNTER — Encounter: Payer: Self-pay | Admitting: Internal Medicine

## 2016-07-28 ENCOUNTER — Ambulatory Visit (INDEPENDENT_AMBULATORY_CARE_PROVIDER_SITE_OTHER): Payer: BLUE CROSS/BLUE SHIELD | Admitting: Internal Medicine

## 2016-07-28 ENCOUNTER — Telehealth: Payer: Self-pay | Admitting: Internal Medicine

## 2016-07-28 VITALS — BP 142/78 | HR 76 | Temp 98.0°F | Resp 16 | Ht 74.0 in | Wt 281.6 lb

## 2016-07-28 DIAGNOSIS — E782 Mixed hyperlipidemia: Secondary | ICD-10-CM | POA: Diagnosis not present

## 2016-07-28 DIAGNOSIS — E349 Endocrine disorder, unspecified: Secondary | ICD-10-CM | POA: Diagnosis not present

## 2016-07-28 DIAGNOSIS — Z125 Encounter for screening for malignant neoplasm of prostate: Secondary | ICD-10-CM

## 2016-07-28 DIAGNOSIS — E1140 Type 2 diabetes mellitus with diabetic neuropathy, unspecified: Secondary | ICD-10-CM

## 2016-07-28 LAB — COMPREHENSIVE METABOLIC PANEL
ALBUMIN: 4.4 g/dL (ref 3.5–5.2)
ALK PHOS: 45 U/L (ref 39–117)
ALT: 25 U/L (ref 0–53)
AST: 22 U/L (ref 0–37)
BILIRUBIN TOTAL: 0.8 mg/dL (ref 0.2–1.2)
BUN: 21 mg/dL (ref 6–23)
CO2: 27 mEq/L (ref 19–32)
Calcium: 9.4 mg/dL (ref 8.4–10.5)
Chloride: 104 mEq/L (ref 96–112)
Creatinine, Ser: 0.93 mg/dL (ref 0.40–1.50)
GFR: 88.52 mL/min (ref >60.00)
Glucose, Bld: 160 mg/dL — ABNORMAL HIGH (ref 70–99)
POTASSIUM: 3.8 meq/L (ref 3.5–5.1)
Sodium: 138 mEq/L (ref 135–145)
TOTAL PROTEIN: 6.9 g/dL (ref 6.0–8.3)

## 2016-07-28 LAB — LIPID PANEL
CHOLESTEROL: 159 mg/dL (ref 0–200)
HDL: 46.7 mg/dL (ref >39.00)
LDL Cholesterol: 89 mg/dL (ref 0–99)
NonHDL: 112.57
Total CHOL/HDL Ratio: 3
Triglycerides: 118 mg/dL (ref 0.0–149.0)
VLDL: 23.6 mg/dL (ref 0.0–40.0)

## 2016-07-28 LAB — HEMOGLOBIN A1C: Hgb A1c MFr Bld: 6.4 % (ref 4.6–6.5)

## 2016-07-28 MED ORDER — TESTOSTERONE CYPIONATE 200 MG/ML IM SOLN: 200.0000 mg | INTRAMUSCULAR | 4 refills | Status: DC

## 2016-07-28 MED ORDER — TESTOSTERONE CYPIONATE 200 MG/ML IM SOLN: 100.0000 mg | INTRAMUSCULAR | 0 refills | Status: DC

## 2016-07-28 MED ORDER — LOSARTAN POTASSIUM-HCTZ 100-12.5 MG PO TABS: 1.0000 | ORAL_TABLET | Freq: Every day | ORAL | 3 refills | Status: DC

## 2016-07-28 MED ORDER — DOXYCYCLINE HYCLATE 100 MG PO TABS: 100.0000 mg | ORAL_TABLET | Freq: Two times a day (BID) | ORAL | 0 refills | Status: DC

## 2016-07-28 MED ORDER — TESTOSTERONE CYPIONATE 200 MG/ML IM SOLN: 100.0000 mg | INTRAMUSCULAR | 4 refills | Status: DC

## 2016-07-28 NOTE — Telephone Encounter (Signed)
Pharmacy called back and wanted to clarify pt's testosterone because it was not the one that they had discussed. Please advise, thank you!

## 2016-07-28 NOTE — Progress Notes (Signed)
Subjective:  Patient ID: David Pineda., male    DOB: September 06, 1955  Age: 61 y.o. MRN: [id number]  CC: The primary encounter diagnosis was Type 2 diabetes mellitus with diabetic neuropathy, without long-term current use of insulin ([place]). Diagnoses of Mixed hyperlipidemia, Hypotestosteronemia, Prostate cancer screening, and Special screening for malignant neoplasm of prostate were also pertinent to this visit.  HPI David Pineda. presents for 3 month follow up on diabetes.  Patient has no complaints today.  Patient is following a low glycemic index diet and taking all prescribed medications regularly without side effects.  Fasting sugars have been under less than 140 most of the time and post prandials have been under 160 except on rare occasions. Patient is exercising about 4 times per week and intentionally trying to lose weight .  Patient has had an eye exam in the last 12 months and checks feet regularly for signs of infection.  Patient does not walk barefoot outside,  And denies an numbness tingling or burning in feet. Patient is up to date on all recommended vaccinations  Weight loss of 20 lb attributed eating better ,exercising more regularly with a trainer.  Wants to get off some meds.  .  Lab Results  Component Value Date   HGBA1C 6.4 07/28/2016     Had a mri spine in July 2 ordred by Ortho for severe sciatica   And hip pain.  Multiple level DDD and and an L5 left sided synovial cyst.  Treated with ESI  Weeks ago  Hip pain immediately  resolved and sciatica of the let leg has resolved.    Colon due 2021 PSA overdue Taking testosterone    Outpatient Medications Prior to Visit  Medication Sig Dispense Refill  . ALPRAZolam (XANAX) 0.5 MG tablet Take 1 tablet (0.5 mg total) by mouth at bedtime as needed for anxiety or sleep. 30 tablet 3  . amLODipine (NORVASC) 10 MG tablet Take 1 tablet (10 mg total) by mouth daily. 90 tablet 0  . Ascorbic Acid (VITAMIN C) 100 MG tablet Take  100 mg by mouth daily.    Marland Kitchen aspirin 325 MG tablet Take 325 mg by mouth daily.    . Azelastine-Fluticasone (DYMISTA) 137-50 MCG/ACT SUSP Place 2 Squirts into the nose daily. In each nostril 23 g 11  . ergocalciferol (DRISDOL) 50000 units capsule Take 1 capsule (50,000 Units total) by mouth once a week. 4 capsule 2  . fish oil-omega-3 fatty acids 1000 MG capsule Take 2 g by mouth daily. Reported on 05/04/2015    . glipiZIDE (GLUCOTROL) 10 MG tablet TAKE ONE TABLET BY MOUTH TWICE DAILY BEFORE MEAL(S) 180 tablet 0  . glucosamine-chondroitin 500-400 MG tablet Take 1 tablet by mouth 3 (three) times daily.    . INS SYRINGE/NEEDLE 1CC/28G (B-D INSULIN SYRINGE 1CC/28G) 28G X 1/2" 1 ML MISC 1 Syringe by Does not apply route daily after supper. 100 each 0  . meloxicam (MOBIC) 15 MG tablet Take 1 tablet (15 mg total) by mouth daily. 90 tablet 0  . metFORMIN (GLUCOPHAGE) 1000 MG tablet Take 1 tablet (1,000 mg total) by mouth 2 (two) times daily with a meal. 180 tablet 1  . Multiple Vitamins-Minerals (MULTIVITAMIN WITH MINERALS) tablet Take 1 tablet by mouth daily. Reported on 05/04/2015    . Potassium 99 MG TABS Take 1 tablet by mouth daily.    . pravastatin (PRAVACHOL) 40 MG tablet TAKE 1 TABLET BY MOUTH DAILY 90 tablet 1  . sildenafil (VIAGRA) 100 MG  tablet Take 1 tablet (100 mg total) by mouth daily as needed for erectile dysfunction. 10 tablet 3  . Syringe/Needle, Disp, (SYRINGE 3CC/20GX1") 20G X 1" 3 ML MISC 1 Syringe by Does not apply route once a week. 24 each 1  . traMADol (ULTRAM) 50 MG tablet Take 1 tablet (50 mg total) by mouth every 6 (six) hours as needed. for pain 120 tablet 0  . lisinopril-hydrochlorothiazide (ZESTORETIC) 20-12.5 MG tablet Take 1 tablet by mouth daily. 90 tablet 0  . testosterone cypionate (DEPOTESTOSTERONE CYPIONATE) 200 MG/ML injection INJECT 1 ML BY MOUTH IN THE MUSCLE EVERY 14 DAYS 10 mL 0   No facility-administered medications prior to visit.     Review of  Systems;  Patient denies headache, fevers, malaise, unintentional weight loss, skin rash, eye pain, sinus congestion and sinus pain, sore throat, dysphagia,  hemoptysis , cough, dyspnea, wheezing, chest pain, palpitations, orthopnea, edema, abdominal pain, nausea, melena, diarrhea, constipation, flank pain, dysuria, hematuria, urinary  Frequency, nocturia, numbness, tingling, seizures,  Focal weakness, Loss of consciousness,  Tremor, insomnia, depression, anxiety, and suicidal ideation.      Objective:  BP (!) 142/78 (BP Location: Left Arm, Patient Position: Sitting, Cuff Size: Large)   Pulse 76   Temp 98 F (36.7 C) (Oral)   Resp 16   Ht 6\' 2"  (1.88 m)   Wt 281 lb 9.6 oz (127.7 kg)   SpO2 97%   BMI 36.16 kg/m   BP Readings from Last 3 Encounters:  07/28/16 (!) 142/78  07/12/16 (!) 184/92  04/28/16 130/72    Wt Readings from Last 3 Encounters:  07/28/16 281 lb 9.6 oz (127.7 kg)  04/28/16 296 lb 8 oz (134.5 kg)  11/18/15 299 lb 3.2 oz (135.7 kg)    General appearance: alert, cooperative and appears stated age Ears: normal TM's and external ear canals both ears Throat: lips, mucosa, and tongue normal; teeth and gums normal Neck: no adenopathy, no carotid bruit, supple, symmetrical, trachea midline and thyroid not enlarged, symmetric, no tenderness/mass/nodules Back: symmetric, no curvature. ROM normal. No CVA tenderness. Lungs: clear to auscultation bilaterally Heart: regular rate and rhythm, S1, S2 normal, no murmur, click, rub or gallop Abdomen: soft, non-tender; bowel sounds normal; no masses,  no organomegaly Pulses: 2+ and symmetric Skin: Skin color, texture, turgor normal. No rashes or lesions Lymph nodes: Cervical, supraclavicular, and axillary nodes normal.  Lab Results  Component Value Date   HGBA1C 6.4 07/28/2016   HGBA1C 5.7 04/28/2016   HGBA1C 5.5 09/22/2015    Lab Results  Component Value Date   CREATININE 0.93 07/28/2016   CREATININE 1.10 04/28/2016    CREATININE 1.01 09/22/2015    Lab Results  Component Value Date   WBC 9.6 04/28/2016   HGB 15.5 04/28/2016   HCT 45.9 04/28/2016   PLT 214.0 04/28/2016   GLUCOSE 160 (H) 07/28/2016   CHOL 159 07/28/2016   TRIG 118.0 07/28/2016   HDL 46.70 07/28/2016   LDLDIRECT 98.0 05/04/2015   LDLCALC 89 07/28/2016   ALT 25 07/28/2016   AST 22 07/28/2016   NA 138 07/28/2016   K 3.8 07/28/2016   CL 104 07/28/2016   CREATININE 0.93 07/28/2016   BUN 21 07/28/2016   CO2 27 07/28/2016   TSH 0.83 10/31/2013   PSA 1.4 07/28/2016   HGBA1C 6.4 07/28/2016   MICROALBUR 0.9 09/22/2015    Dg Epidural/nerve Root  Result Date: 07/12/2016 CLINICAL DATA:  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 Low back pain and left lower  extremity pain and numbness. 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 EXAM: EPIDURAL/NERVE ROOT FLUOROSCOPY TIME:  Radiation Exposure Index (as provided by the fluoroscopic device): 91.46 microGray*m^2 Fluoroscopy Time (in minutes and seconds):  25 seconds PROCEDURE: The procedure, risks, benefits, and alternatives were explained to the patient. Questions regarding the procedure were encouraged and answered. The patient understands and consents to the procedure. LEFT L5 NERVE ROOT BLOCK AND TRANSFORAMINAL EPIDURAL: A posterior oblique approach was taken to the intervertebral foramen on the left at L5-S1 using a curved 5 inch 22 gauge spinal needle. Injection of Isovue-M 200 outlined the left L5 nerve root and showed good epidural spread. No vascular opacification is seen. 120 mg of Depo-Medrol mixed with 2 mL of 1% lidocaine were instilled. The procedure was well-tolerated, and the patient was discharged thirty minutes following the injection in good condition. The patient reported complete resolution of left lower extremity pain at the time of discharge. COMPLICATIONS: None IMPRESSION: Technically  successful injection consisting of a left L5 nerve root block and transforaminal epidural. Electronically Signed   By: Logan Bores M.D.   On: 07/12/2016 08:23    Assessment & Plan:   Problem List Items Addressed This Visit    Diabetes mellitus with neuropathy ([place]) - Primary     well-controlled on maximal doses of glipizide and metformin .  Will reduce glipizide dose to 5 mg bid.  Patient is up-to-date on eye exams and foot exam is normal today. Patient is due for urine microalbumin to creatinine ratio at next visit. Patient is tolerating statin therapy for CAD risk reduction and on ACE/ARB for reduction in proteinuria.   Lab Results  Component Value Date   HGBA1C 6.4 07/28/2016   Lab Results  Component Value Date   MICROALBUR 0.9 09/22/2015         Relevant Medications   losartan-hydrochlorothiazide (HYZAAR) 100-12.5 MG tablet   Other Relevant Orders   Hemoglobin A1c (Completed)   Comprehensive metabolic panel (Completed)   Hyperlipidemia    Tolerating  pravastatin . HDL is improved,  trigs are mildly elevated due to indulgent eating .   Lab Results  Component Value Date   CHOL 159 07/28/2016   HDL 46.70 07/28/2016   LDLCALC 89 07/28/2016   LDLDIRECT 98.0 05/04/2015   TRIG 118.0 07/28/2016   CHOLHDL 3 07/28/2016   Lab Results  Component Value Date   ALT 25 07/28/2016   AST 22 07/28/2016   ALKPHOS 45 07/28/2016   BILITOT 0.8 07/28/2016               Relevant Medications   losartan-hydrochlorothiazide (HYZAAR) 100-12.5 MG tablet   Other Relevant Orders   Lipid panel (Completed)   Special screening for malignant neoplasm of prostate    PSA is normal.  Lab Results  Component Value Date   PSA 1.4 07/28/2016   PSA 0.59 11/01/2012   PSA 0.5 11/10/2011          Other Visit Diagnoses    Hypotestosteronemia       Relevant Orders   Testos,Total,Free and SHBG (Male)   Prostate cancer screening       Relevant Orders   PSA (Completed)      I have  discontinued Mr. Tucholski's lisinopril-hydrochlorothiazide, testosterone cypionate, and testosterone cypionate. I am also having him start on doxycycline and losartan-hydrochlorothiazide. Additionally, I am having him maintain his multivitamin with minerals, vitamin C, fish oil-omega-3 fatty acids, glucosamine-chondroitin, sildenafil,  INS SYRINGE/NEEDLE 1CC/28G, aspirin, Azelastine-Fluticasone, Potassium, ALPRAZolam, metFORMIN, SYRINGE 3CC/20GX1", pravastatin, amLODipine, glipiZIDE, meloxicam, ergocalciferol, and traMADol.  Meds ordered this encounter  Medications  . doxycycline (VIBRA-TABS) 100 MG tablet    Sig: Take 1 tablet (100 mg total) by mouth 2 (two) times daily.    Dispense:  20 tablet    Refill:  0  . DISCONTD: testosterone cypionate (DEPOTESTOSTERONE CYPIONATE) 200 MG/ML injection    Sig: Inject 0.5 mLs (100 mg total) into the muscle every 14 (fourteen) days.    Dispense:  10 mL    Refill:  0  . losartan-hydrochlorothiazide (HYZAAR) 100-12.5 MG tablet    Sig: Take 1 tablet by mouth daily.    Dispense:  90 tablet    Refill:  3  . DISCONTD: testosterone cypionate (DEPOTESTOSTERONE CYPIONATE) 200 MG/ML injection    Sig: Inject 0.5 mLs (100 mg total) into the muscle every 14 (fourteen) days.    Dispense:  6 mL    Refill:  4    Medications Discontinued During This Encounter  Medication Reason  . testosterone cypionate (DEPOTESTOSTERONE CYPIONATE) 200 MG/ML injection Reorder  . lisinopril-hydrochlorothiazide (ZESTORETIC) 20-12.5 MG tablet   . testosterone cypionate (DEPOTESTOSTERONE CYPIONATE) 200 MG/ML injection Reorder    Follow-up: Return in about 3 months (around 10/28/2016) for follow up diabetes  AT LEAST 91 DAYS!!!!.   Crecencio Mc, MD

## 2016-07-28 NOTE — Patient Instructions (Addendum)
I am changing your bp medication from lisinopril to losartan for better control,  When you finish the current bottle  I will refill the testosterone  Today   Goal is 120/70 to 130/80

## 2016-07-28 NOTE — Telephone Encounter (Signed)
Printed, signed and faxed.  

## 2016-07-28 NOTE — Telephone Encounter (Signed)
Fixed.

## 2016-07-28 NOTE — Telephone Encounter (Signed)
Pharmacy called and stated that the testosterone that you refilled today was for 0.39ml every 14 days instead of the 56ml every 14 days that he has been taking. The pharmacy jsut wanted to call and clarify if it needed to be 0.56ml or 52ml?

## 2016-07-29 LAB — PSA: PSA: 1.4 ng/mL (ref <=4.0)

## 2016-07-30 NOTE — Assessment & Plan Note (Signed)
PSA is normal.  Lab Results  Component Value Date   PSA 1.4 07/28/2016   PSA 0.59 11/01/2012   PSA 0.5 11/10/2011

## 2016-07-30 NOTE — Assessment & Plan Note (Signed)
Tolerating  pravastatin . HDL is improved,  trigs are mildly elevated due to indulgent eating .   Lab Results  Component Value Date   CHOL 159 07/28/2016   HDL 46.70 07/28/2016   LDLCALC 89 07/28/2016   LDLDIRECT 98.0 05/04/2015   TRIG 118.0 07/28/2016   CHOLHDL 3 07/28/2016   Lab Results  Component Value Date   ALT 25 07/28/2016   AST 22 07/28/2016   ALKPHOS 45 07/28/2016   BILITOT 0.8 07/28/2016

## 2016-07-30 NOTE — Assessment & Plan Note (Addendum)
well-controlled on maximal doses of glipizide and metformin .  Will reduce glipizide dose to 5 mg bid.  Patient is up-to-date on eye exams and foot exam is normal today. Patient is due for urine microalbumin to creatinine ratio at next visit. Patient is tolerating statin therapy for CAD risk reduction and on ACE/ARB for reduction in proteinuria.   Lab Results  Component Value Date   HGBA1C 6.4 07/28/2016   Lab Results  Component Value Date   MICROALBUR 0.9 09/22/2015

## 2016-08-01 LAB — TESTOS,TOTAL,FREE AND SHBG (FEMALE)
Sex Hormone Binding Glob.: 11 nmol/L — ABNORMAL LOW (ref 22–77)
TESTOSTERONE,TOTAL,LC/MS/MS: 577 ng/dL (ref 250–1100)
Testosterone, Free: 138.5 pg/mL (ref 35.0–155.0)

## 2016-08-02 ENCOUNTER — Encounter: Payer: Self-pay | Admitting: Internal Medicine

## 2016-08-02 ENCOUNTER — Other Ambulatory Visit: Payer: Self-pay | Admitting: Internal Medicine

## 2016-08-02 MED ORDER — PRAVASTATIN SODIUM 40 MG PO TABS: 40.0000 mg | ORAL_TABLET | Freq: Every day | ORAL | 1 refills | Status: DC

## 2016-08-02 MED ORDER — GLIPIZIDE 10 MG PO TABS: ORAL_TABLET | ORAL | 1 refills | Status: DC

## 2016-08-02 MED ORDER — METFORMIN HCL 1000 MG PO TABS: 1000.0000 mg | ORAL_TABLET | Freq: Two times a day (BID) | ORAL | 1 refills | Status: DC

## 2016-08-08 MED ORDER — "SYRINGE 20G X 1"" 3 ML MISC": 1.0000 | 1 refills | Status: DC

## 2016-08-08 MED ORDER — TESTOSTERONE CYPIONATE 200 MG/ML IM SOLN: 200.0000 mg | INTRAMUSCULAR | 4 refills | Status: DC

## 2016-09-13 ENCOUNTER — Other Ambulatory Visit: Payer: Self-pay | Admitting: Internal Medicine

## 2016-10-04 DIAGNOSIS — G4733 Obstructive sleep apnea (adult) (pediatric): Secondary | ICD-10-CM | POA: Diagnosis not present

## 2016-10-05 NOTE — Telephone Encounter (Signed)
Error

## 2016-10-27 ENCOUNTER — Encounter: Payer: Self-pay | Admitting: Internal Medicine

## 2016-10-27 ENCOUNTER — Ambulatory Visit (INDEPENDENT_AMBULATORY_CARE_PROVIDER_SITE_OTHER): Payer: BLUE CROSS/BLUE SHIELD | Admitting: Internal Medicine

## 2016-10-27 VITALS — BP 124/66 | HR 71 | Temp 98.3°F | Resp 15 | Ht 74.0 in | Wt 298.8 lb

## 2016-10-27 DIAGNOSIS — G8929 Other chronic pain: Secondary | ICD-10-CM | POA: Diagnosis not present

## 2016-10-27 DIAGNOSIS — I10 Essential (primary) hypertension: Secondary | ICD-10-CM | POA: Diagnosis not present

## 2016-10-27 DIAGNOSIS — E349 Endocrine disorder, unspecified: Secondary | ICD-10-CM

## 2016-10-27 DIAGNOSIS — M545 Low back pain, unspecified: Secondary | ICD-10-CM

## 2016-10-27 DIAGNOSIS — Z23 Encounter for immunization: Secondary | ICD-10-CM | POA: Diagnosis not present

## 2016-10-27 DIAGNOSIS — M25562 Pain in left knee: Secondary | ICD-10-CM

## 2016-10-27 DIAGNOSIS — E1140 Type 2 diabetes mellitus with diabetic neuropathy, unspecified: Secondary | ICD-10-CM | POA: Diagnosis not present

## 2016-10-27 DIAGNOSIS — F411 Generalized anxiety disorder: Secondary | ICD-10-CM

## 2016-10-27 DIAGNOSIS — M25561 Pain in right knee: Secondary | ICD-10-CM

## 2016-10-27 MED ORDER — TERBINAFINE HCL 250 MG PO TABS: 250.0000 mg | ORAL_TABLET | Freq: Every day | ORAL | 3 refills | Status: DC

## 2016-10-27 NOTE — Progress Notes (Signed)
Subjective:  Patient ID: David Pineda., male    DOB: 1955/11/30  Age: 61 y.o. MRN: [id number]  CC: The primary encounter diagnosis was Chronic bilateral low back pain without sciatica. Diagnoses of Need for immunization against influenza, Chronic pain of both knees, Type 2 diabetes mellitus with diabetic neuropathy, without long-term current use of insulin ([place]), Hypotestosteronism, Obesity, morbid, BMI 40.0-49.9 ([place]), Essential hypertension, and Anxiety state were also pertinent to this visit.  HPI David Pineda. presents for 3 month follow up on diabetes.  Patient has no complaints today, but has been screened for depression and scored 5/9 on PHQP   Back pain hurting more. Last ESI was over 2 months ago Knees hurting agian,  Last stroei injection 10 months ago .  Patient is following a low glycemic index diet and taking all prescribed medications regularly without side effects.  Fasting sugars have been under less than 140 most of the time and post prandials have been under 160 except on rare occasions. Patient is exercising about 3 times per week and intentionally trying to lose weight .  Patient has had an eye exam in the last 12 months and checks feet regularly for signs of infection.  Patient does not walk barefoot outside,  And denies an numbness tingling or burning in feet. Patient is up to date on all recommended vaccinations.  discussed his mood.  Not suicidal, not hopeless,   just bored, and unhappy with the cost of his divorce ($8M over the last 5 years) ,  Over the joint custody of son, chronic pain.   Daughter 39 yrs old,  Paternity unexpectedly confirme from previous relationship. Daughter is  Getting married in Dec.   Referral to gain[facility]  Ingrown toenails hurting ,  Wants referral to [facility] tbd.   Lab Results  Component Value Date   PSA 1.4 07/28/2016   PSA 0.59 11/01/2012   PSA 0.5 11/10/2011         Outpatient Medications Prior to Visit  Medication  Sig Dispense Refill  . amLODipine (NORVASC) 10 MG tablet TAKE 1 TABLET(10 MG) BY MOUTH DAILY 90 tablet 0  . Ascorbic Acid (VITAMIN C) 100 MG tablet Take 100 mg by mouth daily.    Marland Kitchen aspirin 325 MG tablet Take 325 mg by mouth daily.    . Azelastine-Fluticasone (DYMISTA) 137-50 MCG/ACT SUSP Place 2 Squirts into the nose daily. In each nostril 23 g 11  . doxycycline (VIBRA-TABS) 100 MG tablet Take 1 tablet (100 mg total) by mouth 2 (two) times daily. 20 tablet 0  . ergocalciferol (DRISDOL) 50000 units capsule Take 1 capsule (50,000 Units total) by mouth once a week. 4 capsule 2  . fish oil-omega-3 fatty acids 1000 MG capsule Take 2 g by mouth daily. Reported on 05/04/2015    . glipiZIDE (GLUCOTROL) 10 MG tablet TAKE ONE TABLET BY MOUTH TWICE DAILY BEFORE MEAL(S) 180 tablet 1  . glucosamine-chondroitin 500-400 MG tablet Take 1 tablet by mouth 3 (three) times daily.    . INS SYRINGE/NEEDLE 1CC/28G (B-D INSULIN SYRINGE 1CC/28G) 28G X 1/2" 1 ML MISC 1 Syringe by Does not apply route daily after supper. 100 each 0  . lisinopril-hydrochlorothiazide (PRINZIDE,ZESTORETIC) 20-12.5 MG tablet TAKE 1 TABLET BY MOUTH DAILY 90 tablet 0  . losartan-hydrochlorothiazide (HYZAAR) 100-12.5 MG tablet Take 1 tablet by mouth daily. 90 tablet 3  . meloxicam (MOBIC) 15 MG tablet TAKE 1 TABLET(15 MG) BY MOUTH DAILY 90 tablet 0  . metFORMIN (GLUCOPHAGE) 1000  MG tablet Take 1 tablet (1,000 mg total) by mouth 2 (two) times daily with a meal. 180 tablet 1  . Multiple Vitamins-Minerals (MULTIVITAMIN WITH MINERALS) tablet Take 1 tablet by mouth daily. Reported on 05/04/2015    . Potassium 99 MG TABS Take 1 tablet by mouth daily.    . pravastatin (PRAVACHOL) 40 MG tablet Take 1 tablet (40 mg total) by mouth daily. 90 tablet 1  . sildenafil (VIAGRA) 100 MG tablet Take 1 tablet (100 mg total) by mouth daily as needed for erectile dysfunction. 10 tablet 3  . Syringe/Needle, Disp, (SYRINGE 3CC/20GX1") 20G X 1" 3 ML MISC 1 Syringe by Does  not apply route once a week. 24 each 1  . testosterone cypionate (DEPOTESTOSTERONE CYPIONATE) 200 MG/ML injection Inject 1 mL (200 mg total) into the muscle every 14 (fourteen) days. 6 mL 4  . traMADol (ULTRAM) 50 MG tablet Take 1 tablet (50 mg total) by mouth every 6 (six) hours as needed. for pain 120 tablet 0  . ALPRAZolam (XANAX) 0.5 MG tablet Take 1 tablet (0.5 mg total) by mouth at bedtime as needed for anxiety or sleep. (Patient not taking: Reported on 10/27/2016) 30 tablet 3   No facility-administered medications prior to visit.     Review of Systems;  Patient denies headache, fevers, malaise, unintentional weight loss, skin rash, eye pain, sinus congestion and sinus pain, sore throat, dysphagia,  hemoptysis , cough, dyspnea, wheezing, chest pain, palpitations, orthopnea, edema, abdominal pain, nausea, melena, diarrhea, constipation, flank pain, dysuria, hematuria, urinary  Frequency, nocturia, numbness, tingling, seizures,  Focal weakness, Loss of consciousness,  Tremor, insomnia, depression, anxiety, and suicidal ideation.      Objective:  BP 124/66 (BP Location: Left Arm, Patient Position: Sitting, Cuff Size: Large)   Pulse 71   Temp 98.3 F (36.8 C) (Oral)   Resp 15   Ht 6\' 2"  (1.88 m)   Wt 298 lb 12.8 oz (135.5 kg)   SpO2 96%   BMI 38.36 kg/m   BP Readings from Last 3 Encounters:  10/27/16 124/66  07/28/16 (!) 142/78  07/12/16 (!) 184/92    Wt Readings from Last 3 Encounters:  10/27/16 298 lb 12.8 oz (135.5 kg)  07/28/16 281 lb 9.6 oz (127.7 kg)  04/28/16 296 lb 8 oz (134.5 kg)    General appearance: alert, cooperative and appears stated age Ears: normal TM's and external ear canals both ears Throat: lips, mucosa, and tongue normal; teeth and gums normal Neck: no adenopathy, no carotid bruit, supple, symmetrical, trachea midline and thyroid not enlarged, symmetric, no tenderness/mass/nodules Back: symmetric, no curvature. ROM normal. No CVA tenderness. Lungs:  clear to auscultation bilaterally Heart: regular rate and rhythm, S1, S2 normal, no murmur, click, rub or gallop Abdomen: soft, non-tender; bowel sounds normal; no masses,  no organomegaly Pulses: 2+ and symmetric Skin: Skin color, texture, turgor normal. No rashes or lesions Lymph nodes: Cervical, supraclavicular, and axillary nodes normal.  Lab Results  Component Value Date   HGBA1C 6.4 07/28/2016   HGBA1C 5.7 04/28/2016   HGBA1C 5.5 09/22/2015    Lab Results  Component Value Date   CREATININE 0.93 07/28/2016   CREATININE 1.10 04/28/2016   CREATININE 1.01 09/22/2015    Lab Results  Component Value Date   WBC 9.6 04/28/2016   HGB 15.5 04/28/2016   HCT 45.9 04/28/2016   PLT 214.0 04/28/2016   GLUCOSE 160 (H) 07/28/2016   CHOL 159 07/28/2016   TRIG 118.0 07/28/2016   HDL 46.70 07/28/2016  LDLDIRECT 98.0 05/04/2015   LDLCALC 89 07/28/2016   ALT 25 07/28/2016   AST 22 07/28/2016   NA 138 07/28/2016   K 3.8 07/28/2016   CL 104 07/28/2016   CREATININE 0.93 07/28/2016   BUN 21 07/28/2016   CO2 27 07/28/2016   TSH 0.83 10/31/2013   PSA 1.4 07/28/2016   HGBA1C 6.4 07/28/2016   MICROALBUR 0.9 09/22/2015    Dg Epidural/nerve Root  Result Date: 07/12/2016 CLINICAL DATA:  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 Low back pain and left lower extremity pain and numbness. 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 EXAM: EPIDURAL/NERVE ROOT FLUOROSCOPY TIME:  Radiation Exposure Index (as provided by the fluoroscopic device): 91.46 microGray*m^2 Fluoroscopy Time (in minutes and seconds):  25 seconds PROCEDURE: The procedure, risks, benefits, and alternatives were explained to the patient. Questions regarding the procedure were encouraged and answered. The patient understands and consents to the procedure. LEFT L5 NERVE ROOT BLOCK AND TRANSFORAMINAL EPIDURAL: A posterior oblique approach was taken  to the intervertebral foramen on the left at L5-S1 using a curved 5 inch 22 gauge spinal needle. Injection of Isovue-M 200 outlined the left L5 nerve root and showed good epidural spread. No vascular opacification is seen. 120 mg of Depo-Medrol mixed with 2 mL of 1% lidocaine were instilled. The procedure was well-tolerated, and the patient was discharged thirty minutes following the injection in good condition. The patient reported complete resolution of left lower extremity pain at the time of discharge. COMPLICATIONS: None IMPRESSION: Technically successful injection consisting of a left L5 nerve root block and transforaminal epidural. Electronically Signed   By: Logan Bores M.D.   On: 07/12/2016 08:23    Assessment & Plan:   Problem List Items Addressed This Visit    Anxiety state    Depression screen done and results discussed at length .  He does not meet criteria for treatment and is deferring medication       Diabetes mellitus with neuropathy ([place])     well-controlled on  glipizide and metformin including a reduced glipizide dose to 5 mg bid.  Patient is up-to-date on eye exams and foot exam is normal today. Patient is due for urine microalbumin to creatinine ratio at next visit. Patient is tolerating statin therapy for CAD risk reduction and on ACE/ARB for reduction in proteinuria.   Lab Results  Component Value Date   HGBA1C 6.4 07/28/2016   Lab Results  Component Value Date   MICROALBUR 0.9 09/22/2015         Relevant Orders   Comprehensive metabolic panel   Hemoglobin A1C   Microalbumin / creatinine urine ratio   Hypertension    Well controlled on current regimen. Renal function stable, no changes today.  Lab Results  Component Value Date   CREATININE 0.93 07/28/2016   Lab Results  Component Value Date   NA 138 07/28/2016   K 3.8 07/28/2016   CL 104 07/28/2016   CO2 27 07/28/2016         Hypotestosteronism    Managed with 200 mg IM testosterone every 2  weeks,  Needs testosterone level with next lab draw      Relevant Orders   Testos,Total,Free and SHBG (Male)   Obesity, morbid, BMI 40.0-49.9 ([place])    I have congratulated him in reduction of   BMI and encouraged  Continued weight loss with goal of 10% of body weigh over the next 6 months  using a low glycemic index diet and regular exercise a minimum of 5 days per week.         Other Visit Diagnoses    Chronic bilateral low back pain without sciatica    -  Primary   Relevant Orders   Ambulatory referral to Orthopedic Surgery   Need for immunization against influenza       Relevant Orders   Flu Vaccine QUAD 36+ mos IM (Completed)   Chronic pain of both knees       Relevant Orders   Ambulatory referral to Orthopedic Surgery    A total of 25 minutes of face to face time was spent with patient more than half of which was spent in counselling about the above mentioned conditions  and coordination of care  I have discontinued Mr. Gene's ALPRAZolam. I am also having him start on terbinafine. Additionally, I am having him maintain his multivitamin with minerals, vitamin C, fish oil-omega-3 fatty acids, glucosamine-chondroitin, sildenafil, INS SYRINGE/NEEDLE 1CC/28G, aspirin, Azelastine-Fluticasone, Potassium, ergocalciferol, traMADol, doxycycline, losartan-hydrochlorothiazide, glipiZIDE, pravastatin, metFORMIN, testosterone cypionate, SYRINGE 3CC/20GX1", amLODipine, meloxicam, and lisinopril-hydrochlorothiazide.  Meds ordered this encounter  Medications  . terbinafine (LAMISIL) 250 MG tablet    Sig: Take 1 tablet (250 mg total) by mouth daily.    Dispense:  30 tablet    Refill:  3    Medications Discontinued During This Encounter  Medication Reason  . ALPRAZolam (XANAX) 0.5 MG tablet Patient has not taken in last 30 days    Follow-up: No Follow-up on file.   Crecencio Mc, MD

## 2016-10-27 NOTE — Patient Instructions (Addendum)
We're going to treat your toenial fungus with a medication called terbinafine  Stop the pravastain today  In 3 weeks,  Start the terbinafine for your toenail fungus  (once daily for 12 weeks)     Return for non fasting labs on or around  Nov 28th (after Thanksgiving) , at least 3 weels after being on the terbinafine   Terbinafine tablets What is this medicine? TERBINAFINE (TER bin a feen) is an antifungal medicine. It is used to treat certain kinds of fungal or yeast infections. This medicine may be used for other purposes; ask your health care provider or pharmacist if you have questions. COMMON BRAND NAME(S): Lamisil, Terbinex What should I tell my health care provider before I take this medicine? They need to know if you have any of these conditions: -drink alcoholic beverages -kidney disease -liver disease -an unusual or allergic reaction to terbinafine, other medicines, foods, dyes, or preservatives -pregnant or trying to get pregnant -breast-feeding How should I use this medicine? Take this medicine by mouth with a full glass of water. Follow the directions on the prescription label. You can take this medicine with food or on an empty stomach. Take your medicine at regular intervals. Do not take your medicine more often than directed. Do not skip doses or stop your medicine early even if you feel better. Do not stop taking except on your doctor's advice. Talk to your pediatrician regarding the use of this medicine in children. Special care may be needed. Overdosage: If you think you have taken too much of this medicine contact a poison control center or emergency room at once. NOTE: This medicine is only for you. Do not share this medicine with others. What if I miss a dose? If you miss a dose, take it as soon as you can. If it is almost time for your next dose, take only that dose. Do not take double or extra doses. What may interact with this medicine? Do not take this medicine  with any of the following medications: -thioridazine This medicine may also interact with the following medications: -beta-blockers -caffeine -cimetidine -cyclosporine -medicines for depression, anxiety, or psychotic disturbances -medicines for fungal infections like fluconazole and ketoconazole -medicines for irregular heartbeat like amiodarone, flecainide and propafenone -rifampin -warfarin This list may not describe all possible interactions. Give your health care provider a list of all the medicines, herbs, non-prescription drugs, or dietary supplements you use. Also tell them if you smoke, drink alcohol, or use illegal drugs. Some items may interact with your medicine. What should I watch for while using this medicine? Visit your doctor or health care provider regularly. Tell your doctor right away if you have nausea or vomiting, loss of appetite, stomach pain on your right upper side, yellow skin, dark urine, light stools, or are over tired. Some fungal infections need many weeks or months of treatment to cure. If you are taking this medicine for a long time, you will need to have important blood work done. What side effects may I notice from receiving this medicine? Side effects that you should report to your doctor or health care professional as soon as possible: -allergic reactions like skin rash or hives, swelling of the face, lips, or tongue -changes in vision -dark urine -fever or infection -general ill feeling or flu-like symptoms -light-colored stools -loss of appetite, nausea -redness, blistering, peeling or loosening of the skin, including inside the mouth -right upper belly pain -unusually weak or tired -yellowing of the eyes or  skin Side effects that usually do not require medical attention (report to your doctor or health care professional if they continue or are bothersome): -changes in taste -diarrhea -hair loss -muscle or joint pain -stomach gas -stomach  upset This list may not describe all possible side effects. Call your doctor for medical advice about side effects. You may report side effects to FDA at 1-800-FDA-1088. Where should I keep my medicine? Keep out of the reach of children. Store at room temperature below 25 degrees C (77 degrees F). Protect from light. Throw away any unused medicine after the expiration date. NOTE: This sheet is a summary. It may not cover all possible information. If you have questions about this medicine, talk to your doctor, pharmacist, or health care provider.  2018 Elsevier/Gold Standard (2007-03-09 16:28:07)

## 2016-10-29 NOTE — Assessment & Plan Note (Signed)
I have congratulated him in reduction of   BMI and encouraged  Continued weight loss with goal of 10% of body weigh over the next 6 months using a low glycemic index diet and regular exercise a minimum of 5 days per week.   

## 2016-10-29 NOTE — Assessment & Plan Note (Signed)
Depression screen done and results discussed at length .  He does not meet criteria for treatment and is deferring medication

## 2016-10-29 NOTE — Assessment & Plan Note (Signed)
Managed with 200 mg IM testosterone every 2 weeks,  Needs testosterone level with next lab draw

## 2016-10-29 NOTE — Assessment & Plan Note (Signed)
Well controlled on current regimen. Renal function stable, no changes today.  Lab Results  Component Value Date   CREATININE 0.93 07/28/2016   Lab Results  Component Value Date   NA 138 07/28/2016   K 3.8 07/28/2016   CL 104 07/28/2016   CO2 27 07/28/2016

## 2016-10-29 NOTE — Assessment & Plan Note (Signed)
well-controlled on  glipizide and metformin including a reduced glipizide dose to 5 mg bid.  Patient is up-to-date on eye exams and foot exam is normal today. Patient is due for urine microalbumin to creatinine ratio at next visit. Patient is tolerating statin therapy for CAD risk reduction and on ACE/ARB for reduction in proteinuria.   Lab Results  Component Value Date   HGBA1C 6.4 07/28/2016   Lab Results  Component Value Date   MICROALBUR 0.9 09/22/2015

## 2016-11-10 DIAGNOSIS — M47816 Spondylosis without myelopathy or radiculopathy, lumbar region: Secondary | ICD-10-CM | POA: Diagnosis not present

## 2016-11-10 DIAGNOSIS — M5416 Radiculopathy, lumbar region: Secondary | ICD-10-CM | POA: Diagnosis not present

## 2016-11-10 DIAGNOSIS — M5136 Other intervertebral disc degeneration, lumbar region: Secondary | ICD-10-CM | POA: Diagnosis not present

## 2016-11-22 DIAGNOSIS — M170 Bilateral primary osteoarthritis of knee: Secondary | ICD-10-CM | POA: Diagnosis not present

## 2016-12-08 ENCOUNTER — Telehealth: Payer: Self-pay | Admitting: Internal Medicine

## 2016-12-08 NOTE — Telephone Encounter (Signed)
Patient just wanted to confirm he was not to take Pravastatin with Terbinafine, nurse advised patient this was correct was not recommended due to both work through the liver?

## 2016-12-09 NOTE — Telephone Encounter (Signed)
Correct   Do Not take both

## 2017-01-01 ENCOUNTER — Other Ambulatory Visit: Payer: Self-pay | Admitting: Internal Medicine

## 2017-01-17 DIAGNOSIS — E089 Diabetes mellitus due to underlying condition without complications: Secondary | ICD-10-CM | POA: Diagnosis not present

## 2017-01-17 DIAGNOSIS — E119 Type 2 diabetes mellitus without complications: Secondary | ICD-10-CM | POA: Diagnosis not present

## 2017-01-17 DIAGNOSIS — H524 Presbyopia: Secondary | ICD-10-CM | POA: Diagnosis not present

## 2017-01-17 LAB — HM DIABETES EYE EXAM

## 2017-01-23 DIAGNOSIS — Z85828 Personal history of other malignant neoplasm of skin: Secondary | ICD-10-CM | POA: Diagnosis not present

## 2017-01-23 DIAGNOSIS — C44619 Basal cell carcinoma of skin of left upper limb, including shoulder: Secondary | ICD-10-CM | POA: Diagnosis not present

## 2017-01-23 DIAGNOSIS — D485 Neoplasm of uncertain behavior of skin: Secondary | ICD-10-CM | POA: Diagnosis not present

## 2017-02-08 ENCOUNTER — Other Ambulatory Visit: Payer: Self-pay

## 2017-02-08 MED ORDER — TESTOSTERONE CYPIONATE 200 MG/ML IM SOLN: 200.0000 mg | INTRAMUSCULAR | 4 refills | Status: DC

## 2017-02-08 NOTE — Telephone Encounter (Signed)
Refilled: 08/08/2016 Last OV: 10/27/2016 Next OV: not scheduled Last lab: 07/28/2016

## 2017-02-08 NOTE — Telephone Encounter (Signed)
Faxed to [facility]

## 2017-02-22 DIAGNOSIS — C44619 Basal cell carcinoma of skin of left upper limb, including shoulder: Secondary | ICD-10-CM | POA: Diagnosis not present

## 2017-02-22 DIAGNOSIS — L905 Scar conditions and fibrosis of skin: Secondary | ICD-10-CM | POA: Diagnosis not present

## 2017-02-28 DIAGNOSIS — M25561 Pain in right knee: Secondary | ICD-10-CM | POA: Diagnosis not present

## 2017-02-28 DIAGNOSIS — M1711 Unilateral primary osteoarthritis, right knee: Secondary | ICD-10-CM | POA: Diagnosis not present

## 2017-02-28 DIAGNOSIS — M1712 Unilateral primary osteoarthritis, left knee: Secondary | ICD-10-CM | POA: Diagnosis not present

## 2017-02-28 DIAGNOSIS — M25562 Pain in left knee: Secondary | ICD-10-CM | POA: Diagnosis not present

## 2017-04-12 ENCOUNTER — Other Ambulatory Visit: Payer: Self-pay | Admitting: Internal Medicine

## 2017-04-13 NOTE — Telephone Encounter (Signed)
Refilled: 10/27/2016 Last OV: 10/27/2016 Next OV: not scheduled

## 2017-04-13 NOTE — Telephone Encounter (Signed)
Terbinafine refill denied until he has the labs that were ordered in October.  Liver enzymes must be checked and last check was July>

## 2017-04-13 NOTE — Telephone Encounter (Signed)
Spoke with David Pineda to let him know that Dr. Derrel Nip has denied his refill request for terbinafine until he has labs done to check his liver function. David Pineda stated that he is going to hold off for right now because he is fixing to go out of town and has a lot of things going on. David Pineda stated that he would call back when he is ready to have those done.

## 2017-05-09 ENCOUNTER — Other Ambulatory Visit: Payer: Self-pay | Admitting: Internal Medicine

## 2017-07-25 ENCOUNTER — Encounter: Payer: Self-pay | Admitting: *Deleted

## 2017-08-03 DIAGNOSIS — D2271 Melanocytic nevi of right lower limb, including hip: Secondary | ICD-10-CM | POA: Diagnosis not present

## 2017-08-03 DIAGNOSIS — Z85828 Personal history of other malignant neoplasm of skin: Secondary | ICD-10-CM | POA: Diagnosis not present

## 2017-08-11 DIAGNOSIS — G4733 Obstructive sleep apnea (adult) (pediatric): Secondary | ICD-10-CM | POA: Diagnosis not present

## 2017-08-29 IMAGING — US US ABDOMEN COMPLETE
1 series · 13 of 25 positions shown · non-contrast
Comparison: None.

CLINICAL DATA: Elevated liver enzymes. Diabetes, obesity, and
hyperlipidemia.

EXAM:
ABDOMEN ULTRASOUND COMPLETE

[Series 1: us abdomen complete · 0.23mm/px · 13 of 85 slices shown]
[im 1/85]
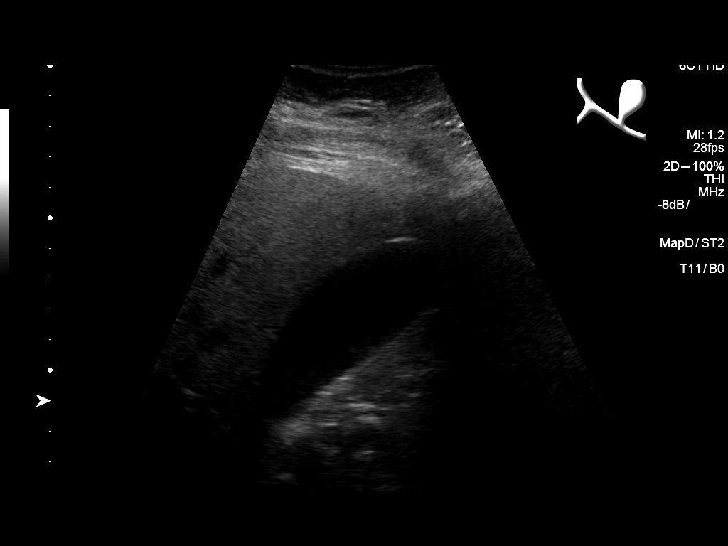
[im 8/85]
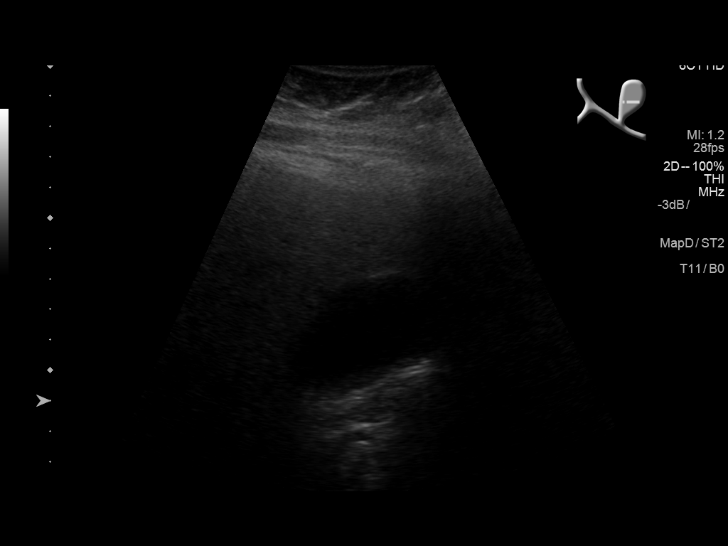
[im 15/85]
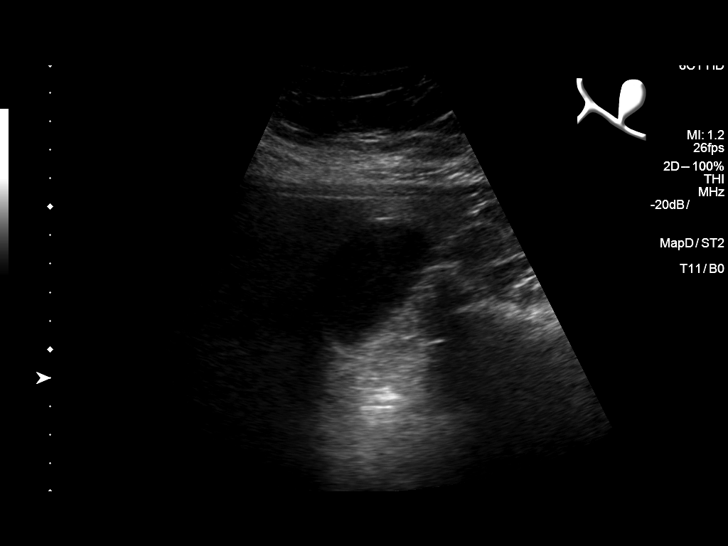
[im 22/85]
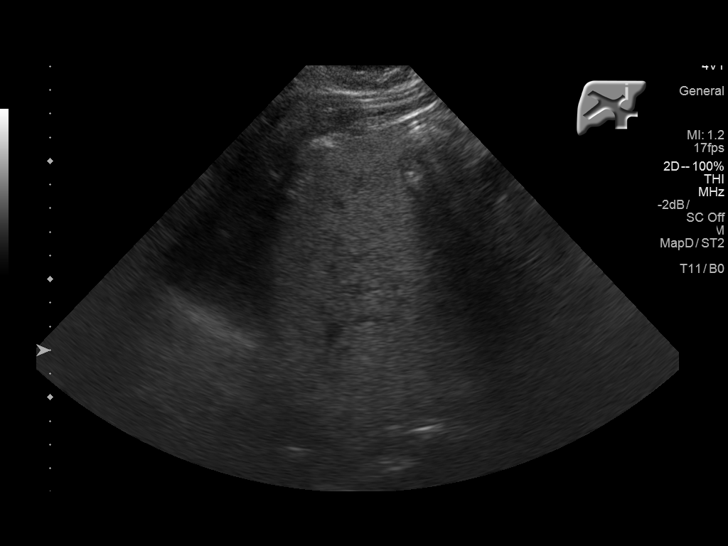
[im 29/85]
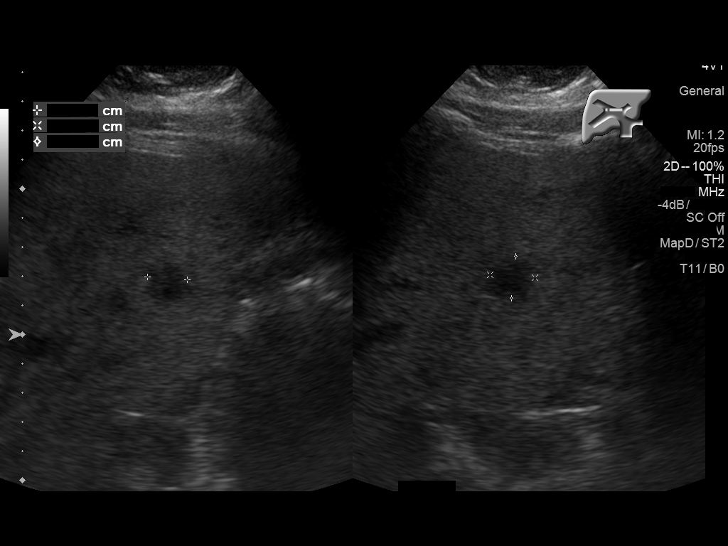
[im 36/85]
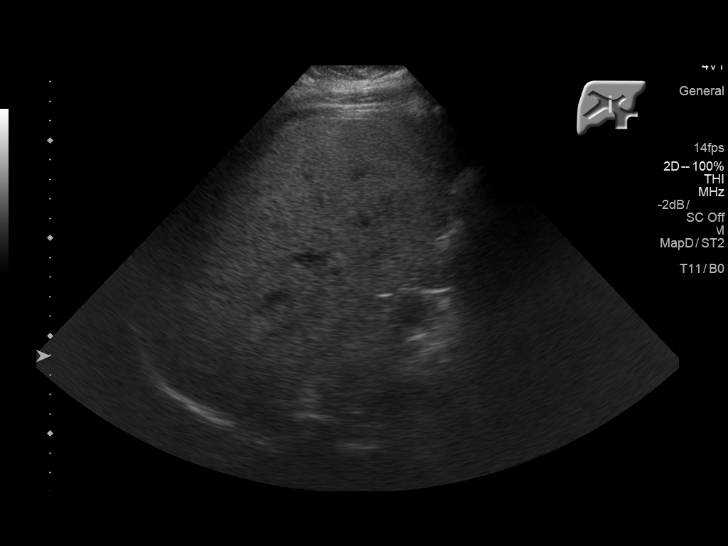
[im 43/85]
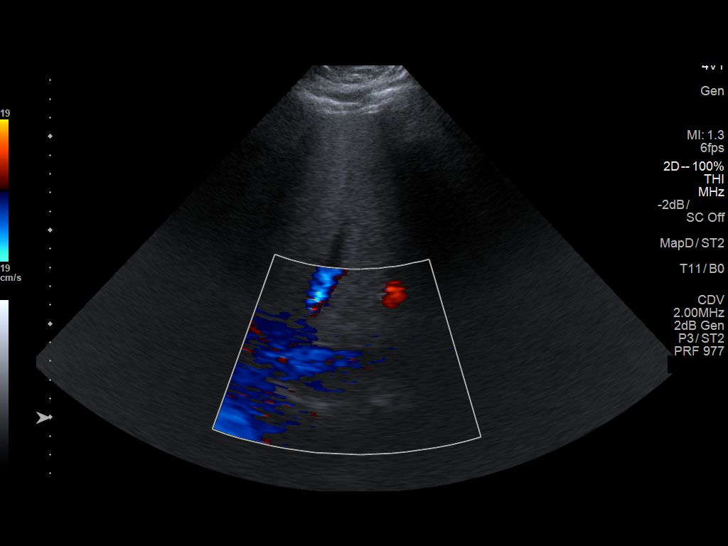
[im 50/85]
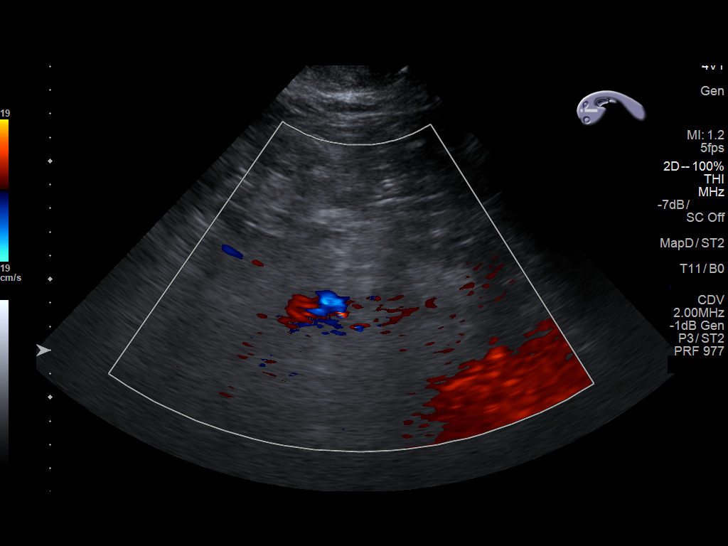
[im 57/85]
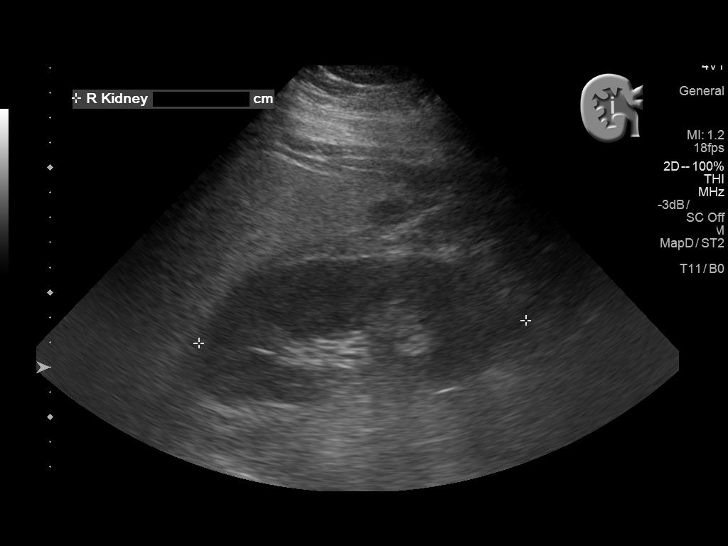
[im 64/85]
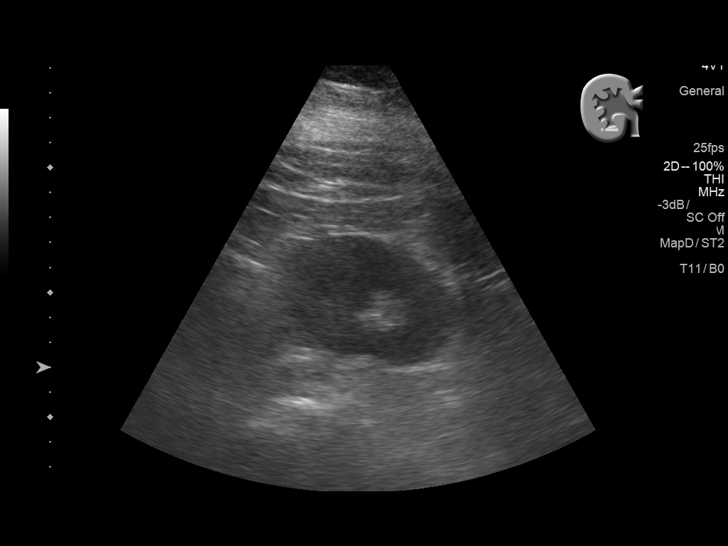
[im 71/85]
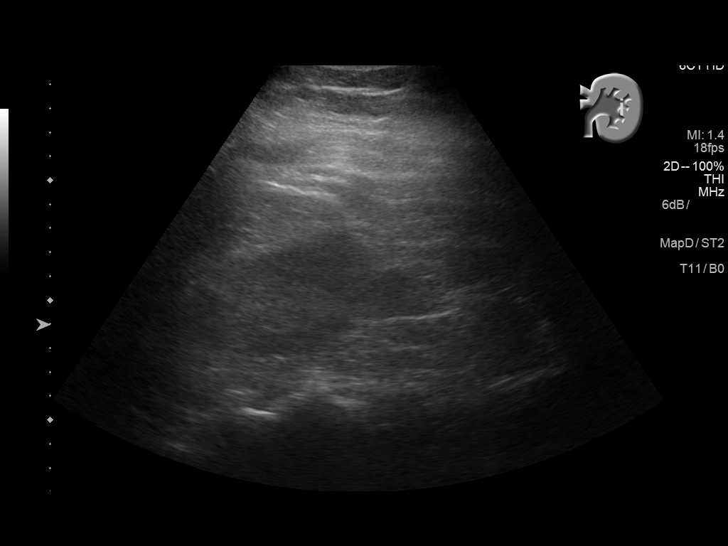
[im 78/85]
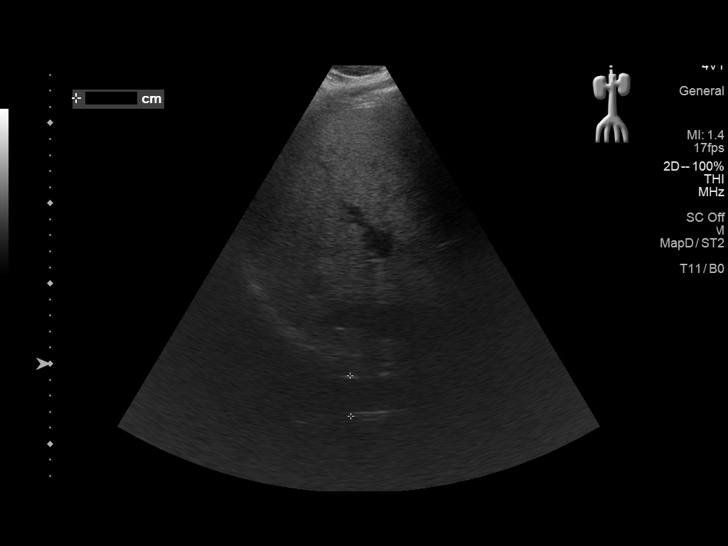
[im 85/85]
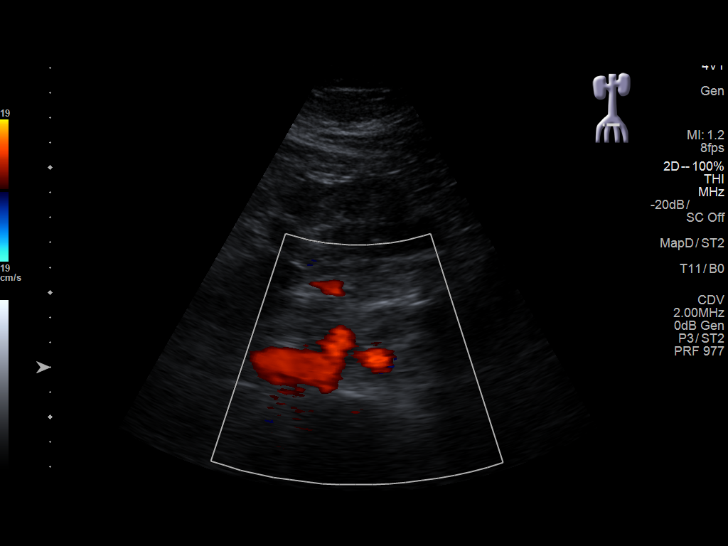

[13 of 25 positions shown; findings below may reference images not displayed]

FINDINGS: Technically challenging/limited study due to patient body habitus.

Gallbladder: No gallstones or wall thickening visualized. No
sonographic Murphy sign noted by sonographer.

Common bile duct: Diameter: 5 mm

Liver: Moderately increased echogenicity diffusely. 1.4 x 1.5 x
cm hypoechoic focus adjacent to the gallbladder in the right hepatic
lobe likely reflecting focal fatty sparing. 2.1 x 1.1 x 1.6 cm
hyppo- to anechoic lesion with increased through transmission in the
right hepatic lobe and without evidence of internal vascularity,
likely a cyst.

IVC: No abnormality visualized.

Pancreas: Visualized portion unremarkable.

Spleen: Size and appearance within normal limits.

Right Kidney: Length: 13.1 cm. Echogenicity within normal limits. No
mass or hydronephrosis visualized.

Left Kidney: Length: 12.8 cm. Echogenicity within normal limits. No
mass or hydronephrosis visualized.

Abdominal aorta: Suboptimally visualized.  No aneurysm identified.

Other findings: None.
IMPRESSION: 1. Increased liver echogenicity likely reflecting steatosis. Focal
fatty sparing adjacent to the gallbladder. 2.1 cm right hepatic
cyst.
2. Otherwise negative examination.

## 2017-09-04 ENCOUNTER — Other Ambulatory Visit: Payer: Self-pay

## 2017-09-04 NOTE — Telephone Encounter (Signed)
Refilled: 1/30/219 Last OV: 10/27/2017 Next OV: not scheduled

## 2017-09-18 ENCOUNTER — Telehealth: Payer: Self-pay | Admitting: Internal Medicine

## 2017-09-18 NOTE — Telephone Encounter (Signed)
Copied from [place] [id number]. Topic: Quick Communication - See Telephone Encounter >> Sep 18, 2017  5:44 PM Neva Seat wrote: Pt needing an emergency testosterone cypionate (DEPOTESTOSTERONE CYPIONATE) 200 MG/ML injection to stay current with medication. Pt wants to know if he needs to come in to get more refills on the medication.  Please call pt back to let him know on this asap.

## 2017-09-19 NOTE — Telephone Encounter (Signed)
rx request for testosterone Patient last seen 10-27-16 Last filled on 02-08-17 Last labs on 10-29-16

## 2017-09-19 NOTE — Telephone Encounter (Signed)
This was denied on 09/04/2017.

## 2017-09-19 NOTE — Telephone Encounter (Signed)
MyChart message sent .  Refill denied

## 2017-09-22 ENCOUNTER — Ambulatory Visit: Payer: BLUE CROSS/BLUE SHIELD | Admitting: Internal Medicine

## 2017-09-22 ENCOUNTER — Encounter: Payer: Self-pay | Admitting: Internal Medicine

## 2017-09-22 VITALS — BP 156/80 | HR 70 | Temp 98.2°F | Resp 15 | Ht 74.0 in | Wt 296.0 lb

## 2017-09-22 DIAGNOSIS — E349 Endocrine disorder, unspecified: Secondary | ICD-10-CM

## 2017-09-22 DIAGNOSIS — E1140 Type 2 diabetes mellitus with diabetic neuropathy, unspecified: Secondary | ICD-10-CM

## 2017-09-22 DIAGNOSIS — Z125 Encounter for screening for malignant neoplasm of prostate: Secondary | ICD-10-CM

## 2017-09-22 DIAGNOSIS — Z79899 Other long term (current) drug therapy: Secondary | ICD-10-CM

## 2017-09-22 DIAGNOSIS — Z23 Encounter for immunization: Secondary | ICD-10-CM | POA: Diagnosis not present

## 2017-09-22 DIAGNOSIS — I10 Essential (primary) hypertension: Secondary | ICD-10-CM

## 2017-09-22 DIAGNOSIS — R079 Chest pain, unspecified: Secondary | ICD-10-CM

## 2017-09-22 DIAGNOSIS — E782 Mixed hyperlipidemia: Secondary | ICD-10-CM | POA: Diagnosis not present

## 2017-09-22 LAB — POCT GLYCOSYLATED HEMOGLOBIN (HGB A1C): Hemoglobin A1C: 5.6 % (ref 4.0–5.6)

## 2017-09-22 MED ORDER — TESTOSTERONE CYPIONATE 200 MG/ML IM SOLN: 200.0000 mg | INTRAMUSCULAR | 4 refills | Status: DC

## 2017-09-22 NOTE — Telephone Encounter (Signed)
Mychart message was received and read by pt.

## 2017-09-22 NOTE — Patient Instructions (Addendum)
Your diabetes  Is  Under EXCELLENT  control currently. .Please return in 6 months for follow up on diabetes and make sure you are seeing your eye doctor at least once a year.    Check your home readings for BP and let me  know if your readings are not 130/80 or less on a regular basis    Return for fasting labs (MAKE AN APPOINTMENT)  AT YOUR LEISURE. I HAVE REFILLED EVERYTHING

## 2017-09-22 NOTE — Progress Notes (Signed)
Subjective:  Patient ID: David Pineda., male    DOB: 01-22-1955  Age: 62 y.o. MRN: [id number]  CC: The primary encounter diagnosis was Need for influenza vaccination. Diagnoses of Type 2 diabetes mellitus with diabetic neuropathy, without long-term current use of insulin ([place]), Mixed hyperlipidemia, Hypotestosteronism, Long-term use of high-risk medication, Prostate cancer screening, Chest pain, unspecified type, Essential hypertension, and Special screening for malignant neoplasm of prostate were also pertinent to this visit.  HPI David Pineda. presents for follow up on diabetes, hypertension, hypotestosteornism,  Obesity  And hyperlipidemia .Marland Kitchen  Patient has been lost to follow up since October 2018  He has not had labs in over a year.  Had an episode of left sided neck pain recently that lasted 5-10 minutes before resolving spontaneously. DOES NOT OCCUR WITH EXERCISE   Obesity: personal  best was 221 . Frustrated at inability to lose weight. Exercises several days pwer week with a personal trainer.   Diet reviewed.  Low carb. Portion control  Identified as an issue.   HTN:  Did not take bp meds today .  Home readings have not been checked lately.  .    Patient is following a low glycemic index diet .  Low carb wraps from [place]. and taking all prescribed medications regularly without side effects.  Fasting sugars have been under less than 130 most of the time and post prandials have been under 160 except on rare occasions. Patient is exercising about 3 times per week and intentionally trying to lose weight .  Patient has had an eye exam in the last 12 months and checks feet regularly for signs of infection.  Patient does not walk barefoot outside,  And denies an numbness tingling or burning in feet. Patient is up to date on all recommended vaccinations   Last testosterone dose was 3 weeks ago   Lab Results  Component Value Date   HGBA1C 5.6 09/22/2017     Outpatient Medications  Prior to Visit  Medication Sig Dispense Refill  . amLODipine (NORVASC) 10 MG tablet TAKE 1 TABLET(10 MG) BY MOUTH DAILY 90 tablet 1  . Ascorbic Acid (VITAMIN C) 100 MG tablet Take 100 mg by mouth daily.    Marland Kitchen aspirin 325 MG tablet Take 325 mg by mouth daily.    . Azelastine-Fluticasone (DYMISTA) 137-50 MCG/ACT SUSP Place 2 Squirts into the nose daily. In each nostril 23 g 11  . doxycycline (VIBRA-TABS) 100 MG tablet Take 1 tablet (100 mg total) by mouth 2 (two) times daily. 20 tablet 0  . ergocalciferol (DRISDOL) 50000 units capsule Take 1 capsule (50,000 Units total) by mouth once a week. 4 capsule 2  . glipiZIDE (GLUCOTROL) 10 MG tablet TAKE ONE TABLET BY MOUTH TWICE DAILY BEFORE MEAL(S) 180 tablet 1  . glucosamine-chondroitin 500-400 MG tablet Take 1 tablet by mouth 3 (three) times daily.    . INS SYRINGE/NEEDLE 1CC/28G (B-D INSULIN SYRINGE 1CC/28G) 28G X 1/2" 1 ML MISC 1 Syringe by Does not apply route daily after supper. 100 each 0  . lisinopril-hydrochlorothiazide (PRINZIDE,ZESTORETIC) 20-12.5 MG tablet TAKE 1 TABLET BY MOUTH DAILY 90 tablet 1  . losartan-hydrochlorothiazide (HYZAAR) 100-12.5 MG tablet Take 1 tablet by mouth daily. 90 tablet 3  . meloxicam (MOBIC) 15 MG tablet TAKE 1 TABLET(15 MG) BY MOUTH DAILY 90 tablet 1  . metFORMIN (GLUCOPHAGE) 1000 MG tablet Take 1 tablet (1,000 mg total) by mouth 2 (two) times daily with a meal. 180 tablet 1  . Potassium  99 MG TABS Take 1 tablet by mouth daily.    . pravastatin (PRAVACHOL) 40 MG tablet Take 1 tablet (40 mg total) by mouth daily. 90 tablet 1  . sildenafil (VIAGRA) 100 MG tablet Take 1 tablet (100 mg total) by mouth daily as needed for erectile dysfunction. 10 tablet 3  . Syringe/Needle, Disp, (SYRINGE 3CC/20GX1") 20G X 1" 3 ML MISC 1 Syringe by Does not apply route once a week. 24 each 1  . terbinafine (LAMISIL) 250 MG tablet Take 1 tablet (250 mg total) by mouth daily. 30 tablet 3  . traMADol (ULTRAM) 50 MG tablet Take 1 tablet (50 mg  total) by mouth every 6 (six) hours as needed. for pain 120 tablet 0  . testosterone cypionate (DEPOTESTOSTERONE CYPIONATE) 200 MG/ML injection Inject 1 mL (200 mg total) into the muscle every 14 (fourteen) days. 6 mL 4  . fish oil-omega-3 fatty acids 1000 MG capsule Take 2 g by mouth daily. Reported on 05/04/2015    . Multiple Vitamins-Minerals (MULTIVITAMIN WITH MINERALS) tablet Take 1 tablet by mouth daily. Reported on 05/04/2015     No facility-administered medications prior to visit.     Review of Systems;  Patient denies headache, fevers, malaise, unintentional weight loss, skin rash, eye pain, sinus congestion and sinus pain, sore throat, dysphagia,  hemoptysis , cough, dyspnea, wheezing, chest pain, palpitations, orthopnea, edema, abdominal pain, nausea, melena, diarrhea, constipation, flank pain, dysuria, hematuria, urinary  Frequency, nocturia, numbness, tingling, seizures,  Focal weakness, Loss of consciousness,  Tremor, insomnia, depression, anxiety, and suicidal ideation.      Objective:  BP (!) 156/80 (BP Location: Left Arm, Patient Position: Sitting, Cuff Size: Large)   Pulse 70   Temp 98.2 F (36.8 C) (Oral)   Resp 15   Ht 6\' 2"  (1.88 m)   Wt 296 lb (134.3 kg)   SpO2 96%   BMI 38.00 kg/m   BP Readings from Last 3 Encounters:  09/22/17 (!) 156/80  10/27/16 124/66  07/28/16 (!) 142/78    Wt Readings from Last 3 Encounters:  09/22/17 296 lb (134.3 kg)  10/27/16 298 lb 12.8 oz (135.5 kg)  07/28/16 281 lb 9.6 oz (127.7 kg)    General appearance: alert, cooperative and appears stated age Ears: normal TM's and external ear canals both ears Throat: lips, mucosa, and tongue normal; teeth and gums normal Neck: no adenopathy, no carotid bruit, supple, symmetrical, trachea midline and thyroid not enlarged, symmetric, no tenderness/mass/nodules Back: symmetric, no curvature. ROM normal. No CVA tenderness. Lungs: clear to auscultation bilaterally Heart: regular rate and  rhythm, S1, S2 normal, no murmur, click, rub or gallop Abdomen: soft, non-tender; bowel sounds normal; no masses,  no organomegaly Pulses: 2+ and symmetric Skin: Skin color, texture, turgor normal. No rashes or lesions Lymph nodes: Cervical, supraclavicular, and axillary nodes normal.  Lab Results  Component Value Date   HGBA1C 5.6 09/22/2017   HGBA1C 6.4 07/28/2016   HGBA1C 5.7 04/28/2016    Lab Results  Component Value Date   CREATININE 1.26 09/22/2017   CREATININE 0.93 07/28/2016   CREATININE 1.10 04/28/2016    Lab Results  Component Value Date   WBC 7.2 09/22/2017   HGB 14.2 09/22/2017   HCT 41.2 09/22/2017   PLT 192 09/22/2017   GLUCOSE 118 (H) 09/22/2017   CHOL 159 07/28/2016   TRIG 118.0 07/28/2016   HDL 46.70 07/28/2016   LDLDIRECT 114 (H) 09/22/2017   LDLCALC 89 07/28/2016   ALT 17 09/22/2017   AST  18 09/22/2017   NA 139 09/22/2017   K 4.3 09/22/2017   CL 102 09/22/2017   CREATININE 1.26 09/22/2017   BUN 25 09/22/2017   CO2 27 09/22/2017   TSH 1.04 09/22/2017   PSA 1.8 09/22/2017   HGBA1C 5.6 09/22/2017   MICROALBUR 0.6 09/22/2017    Dg Epidural/nerve Root  Result Date: 07/12/2016 CLINICAL DATA:  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 Low back pain and left lower extremity pain and numbness. 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 EXAM: EPIDURAL/NERVE ROOT FLUOROSCOPY TIME:  Radiation Exposure Index (as provided by the fluoroscopic device): 91.46 microGray*m^2 Fluoroscopy Time (in minutes and seconds):  25 seconds PROCEDURE: The procedure, risks, benefits, and alternatives were explained to the patient. Questions regarding the procedure were encouraged and answered. The patient understands and consents to the procedure. LEFT L5 NERVE ROOT BLOCK AND TRANSFORAMINAL EPIDURAL: A posterior oblique approach was taken to the intervertebral foramen on the left at L5-S1 using  a curved 5 inch 22 gauge spinal needle. Injection of Isovue-M 200 outlined the left L5 nerve root and showed good epidural spread. No vascular opacification is seen. 120 mg of Depo-Medrol mixed with 2 mL of 1% lidocaine were instilled. The procedure was well-tolerated, and the patient was discharged thirty minutes following the injection in good condition. The patient reported complete resolution of left lower extremity pain at the time of discharge. COMPLICATIONS: None IMPRESSION: Technically successful injection consisting of a left L5 nerve root block and transforaminal epidural. Electronically Signed   By: Logan Bores M.D.   On: 07/12/2016 08:23    Assessment & Plan:   Problem List Items Addressed This Visit    Chest pain, unspecified    Recent episode of lft sided neck pain occurring at rest discussed,  Atypical,  Does not occur with exercise.   Noncardiac cause suggested       Diabetes mellitus with neuropathy ([place])     well-controlled on  glipizide and metformin including a reduced glipizide dose to 5 mg bid.  Patient is due for annual  eye exam and foot exam is normal today. Patient has a normal urine microalbumin to creatinine ratio . Patient is tolerating statin therapy for CAD risk reduction and on ACE/ARB for reduction in proteinuria.   Lab Results  Component Value Date   HGBA1C 5.6 09/22/2017   Lab Results  Component Value Date   MICROALBUR 0.6 09/22/2017         Relevant Orders   POCT HgB A1C (Completed)   Microalbumin / creatinine urine ratio (Completed)   Hyperlipidemia   Relevant Orders   LDL cholesterol, direct (Completed)   Hypertension    Control is indeterminate since her forgot to take meds today.  he reports compliance with medication regimen  but has an elevated reading today in office.  He has been asked to check his BP at work and  submit readings for evaluation. Renal function is normal  today.  Lab Results  Component Value Date   CREATININE 1.26  09/22/2017   Lab Results  Component Value Date   NA 139 09/22/2017   K 4.3 09/22/2017   CL 102 09/22/2017   CO2 27 09/22/2017  '      Hypotestosteronism    Secondary to pituitary tumor.  Managed with 200 mg IM testosterone every 2 weeks,  Needs testosterone level at mid week and will schedule a fasting lab draw.  Relevant Orders   TSH (Completed)   Special screening for malignant neoplasm of prostate     Annual PSA is normal and unchanged   Lab Results  Component Value Date   PSA 1.8 09/22/2017   PSA 1.4 07/28/2016   PSA 0.59 11/01/2012         Other Visit Diagnoses    Need for influenza vaccination    -  Primary   Relevant Orders   Flu Vaccine QUAD 6+ mos PF IM (Fluarix Quad PF) (Completed)   Long-term use of high-risk medication       Relevant Orders   Comprehensive metabolic panel (Completed)   CBC with Differential/Platelet (Completed)   Prostate cancer screening       Relevant Orders   PSA (Completed)      I have discontinued Mathayus Gartley Jr.'s multivitamin with minerals and fish oil-omega-3 fatty acids. I am also having him maintain his vitamin C, glucosamine-chondroitin, sildenafil, INS SYRINGE/NEEDLE 1CC/28G, aspirin, Azelastine-Fluticasone, Potassium, ergocalciferol, traMADol, doxycycline, losartan-hydrochlorothiazide, glipiZIDE, pravastatin, metFORMIN, SYRINGE 3CC/20GX1", terbinafine, meloxicam, lisinopril-hydrochlorothiazide, amLODipine, and testosterone cypionate.  Meds ordered this encounter  Medications  . testosterone cypionate (DEPOTESTOSTERONE CYPIONATE) 200 MG/ML injection    Sig: Inject 1 mL (200 mg total) into the muscle every 14 (fourteen) days.    Dispense:  6 mL    Refill:  4    Medications Discontinued During This Encounter  Medication Reason  . Multiple Vitamins-Minerals (MULTIVITAMIN WITH MINERALS) tablet Patient Preference  . fish oil-omega-3 fatty acids 1000 MG capsule Patient Preference  . testosterone cypionate  (DEPOTESTOSTERONE CYPIONATE) 200 MG/ML injection Reorder    Follow-up: Return in about 6 months (around 03/23/2018).   Crecencio Mc, MD

## 2017-09-23 LAB — CBC WITH DIFFERENTIAL/PLATELET
Basophils Absolute: 22 cells/uL (ref 0–200)
Basophils Relative: 0.3 %
EOS PCT: 1.4 %
Eosinophils Absolute: 101 cells/uL (ref 15–500)
HEMATOCRIT: 41.2 % (ref 38.5–50.0)
Hemoglobin: 14.2 g/dL (ref 13.2–17.1)
LYMPHS ABS: 1634 {cells}/uL (ref 850–3900)
MCH: 29.8 pg (ref 27.0–33.0)
MCHC: 34.5 g/dL (ref 32.0–36.0)
MCV: 86.4 fL (ref 80.0–100.0)
MPV: 10.9 fL (ref 7.5–12.5)
Monocytes Relative: 11.5 %
NEUTROS ABS: 4615 {cells}/uL (ref 1500–7800)
NEUTROS PCT: 64.1 %
PLATELETS: 192 10*3/uL (ref 140–400)
RBC: 4.77 10*6/uL (ref 4.20–5.80)
RDW: 13.6 % (ref 11.0–15.0)
Total Lymphocyte: 22.7 %
WBC mixed population: 828 cells/uL (ref 200–950)
WBC: 7.2 10*3/uL (ref 3.8–10.8)

## 2017-09-23 LAB — COMPREHENSIVE METABOLIC PANEL
AG Ratio: 1.8 (calc) (ref 1.0–2.5)
ALKALINE PHOSPHATASE (APISO): 42 U/L (ref 40–115)
ALT: 17 U/L (ref 9–46)
AST: 18 U/L (ref 10–35)
Albumin: 4.6 g/dL (ref 3.6–5.1)
BILIRUBIN TOTAL: 0.7 mg/dL (ref 0.2–1.2)
BUN: 25 mg/dL (ref 7–25)
CALCIUM: 10.1 mg/dL (ref 8.6–10.3)
CO2: 27 mmol/L (ref 20–32)
Chloride: 102 mmol/L (ref 98–110)
Creat: 1.26 mg/dL (ref 0.70–1.33)
Globulin: 2.6 g/dL (calc) (ref 1.9–3.7)
Glucose, Bld: 118 mg/dL — ABNORMAL HIGH (ref 65–99)
Potassium: 4.3 mmol/L (ref 3.5–5.3)
Sodium: 139 mmol/L (ref 135–146)
Total Protein: 7.2 g/dL (ref 6.1–8.1)

## 2017-09-23 LAB — PSA: PSA: 1.8 ng/mL (ref <=4.0)

## 2017-09-23 LAB — LDL CHOLESTEROL, DIRECT: Direct LDL: 114 mg/dL — ABNORMAL HIGH (ref <100)

## 2017-09-23 LAB — MICROALBUMIN / CREATININE URINE RATIO
CREATININE, URINE: 78 mg/dL (ref 20–320)
Microalb Creat Ratio: 8 mcg/mg creat (ref <30)
Microalb, Ur: 0.6 mg/dL

## 2017-09-23 LAB — TSH: TSH: 1.04 m[IU]/L (ref 0.40–4.50)

## 2017-09-24 NOTE — Assessment & Plan Note (Addendum)
Recent episode of lft sided neck pain occurring at rest discussed,  Atypical,  Does not occur with exercise.   Noncardiac cause suggested

## 2017-09-24 NOTE — Assessment & Plan Note (Signed)
well-controlled on  glipizide and metformin including a reduced glipizide dose to 5 mg bid.  Patient is due for annual  eye exam and foot exam is normal today. Patient has a normal urine microalbumin to creatinine ratio . Patient is tolerating statin therapy for CAD risk reduction and on ACE/ARB for reduction in proteinuria.   Lab Results  Component Value Date   HGBA1C 5.6 09/22/2017   Lab Results  Component Value Date   MICROALBUR 0.6 09/22/2017

## 2017-09-24 NOTE — Assessment & Plan Note (Signed)
Secondary to pituitary tumor.  Managed with 200 mg IM testosterone every 2 weeks,  Needs testosterone level at mid week and will schedule a fasting lab draw.

## 2017-09-24 NOTE — Assessment & Plan Note (Addendum)
Control is indeterminate since her forgot to take meds today.  he reports compliance with medication regimen  but has an elevated reading today in office.  He has been asked to check his BP at work and  submit readings for evaluation. Renal function is normal  today.  Lab Results  Component Value Date   CREATININE 1.26 09/22/2017   Lab Results  Component Value Date   NA 139 09/22/2017   K 4.3 09/22/2017   CL 102 09/22/2017   CO2 27 09/22/2017  '

## 2017-09-24 NOTE — Assessment & Plan Note (Signed)
Annual PSA is normal and unchanged   Lab Results  Component Value Date   PSA 1.8 09/22/2017   PSA 1.4 07/28/2016   PSA 0.59 11/01/2012

## 2017-10-10 ENCOUNTER — Encounter: Payer: Self-pay | Admitting: Internal Medicine

## 2017-10-10 ENCOUNTER — Ambulatory Visit: Payer: BLUE CROSS/BLUE SHIELD | Admitting: Internal Medicine

## 2017-10-10 VITALS — BP 140/74 | HR 77 | Temp 97.9°F | Resp 15 | Ht 74.0 in | Wt 298.8 lb

## 2017-10-10 DIAGNOSIS — M4727 Other spondylosis with radiculopathy, lumbosacral region: Secondary | ICD-10-CM

## 2017-10-10 DIAGNOSIS — M170 Bilateral primary osteoarthritis of knee: Secondary | ICD-10-CM | POA: Diagnosis not present

## 2017-10-10 DIAGNOSIS — L853 Xerosis cutis: Secondary | ICD-10-CM

## 2017-10-10 DIAGNOSIS — M48061 Spinal stenosis, lumbar region without neurogenic claudication: Secondary | ICD-10-CM | POA: Diagnosis not present

## 2017-10-10 DIAGNOSIS — H60541 Acute eczematoid otitis externa, right ear: Secondary | ICD-10-CM

## 2017-10-10 MED ORDER — LOSARTAN POTASSIUM 100 MG PO TABS: 100.0000 mg | ORAL_TABLET | Freq: Every day | ORAL | 3 refills | Status: DC

## 2017-10-10 MED ORDER — MOMETASONE FUROATE 0.1 % EX OINT: TOPICAL_OINTMENT | Freq: Every day | CUTANEOUS | 3 refills | Status: DC

## 2017-10-10 MED ORDER — FUROSEMIDE 20 MG PO TABS: 20.0000 mg | ORAL_TABLET | Freq: Every day | ORAL | 3 refills | Status: DC

## 2017-10-10 NOTE — Assessment & Plan Note (Signed)
Mometasone ointment  Once daily to canal. . Advised to stop using alcohol and peroxide.

## 2017-10-10 NOTE — Assessment & Plan Note (Signed)
Referral to orthopedics for evaluation

## 2017-10-10 NOTE — Patient Instructions (Addendum)
  You can add up to 1000 mg of acetominophen (tylenol) every day safely  In divided doses (500 mg every   12 hours.)   Your ear look inflamed, but not infected.    Itchy ear is usually due to eczema.  I have prescribed Elocon steroid ointment for both ears once  daily for itching .  No more alcohol  In ears !     Stop the lisinopril hctz  And start losartan  100 MG DAILY FOR BLOOD PRESSURE  Continue amlodipine daily as well   Consider getting Hepatitis A and B vaccines   Check with insurance . If covered,   Yo can return here for the series,  If not,  It will be cheaper at one of the chain or local pharmacies

## 2017-10-10 NOTE — Assessment & Plan Note (Signed)
With prior resolution of pain following Synvisc injections.  Using meloxicam and tylenol .Referring to Orthopedics

## 2017-10-10 NOTE — Progress Notes (Signed)
Subjective:  Patient ID: David Pineda., male    DOB: July 01, 1955  Age: 62 y.o. MRN: [id number]  CC: The primary encounter diagnosis was Primary osteoarthritis of both knees. Diagnoses of Spinal stenosis of lumbar region at multiple levels, Acute eczematoid otitis externa of right ear, Spondylosis of lumbosacral spine with radiculopathy, and Dry skin were also pertinent to this visit.  HPI David Pineda. presents for evaluation of bilateral ear pain,  Back pain and  knee pain  1) ear pain:  History of Chronic itching . 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  received one [facility] July 2018 by [facility] radiologist with good relief of left left radiculopathy that went only to hip.  Currently pain radiates to right hip, not as severe as last year,  Avoids aggravating it when working out but pain is present constantly .     3) knee pain, bilateral, secondary to DJD  Has had previous therapeutic intervention  with synvisc injections by Dorise Hiss  in both knees last year which  relieved pain for nearly a year .  Currently taking meloxicam.    4) Dry skin.  Feels like he is dehydrated despite drinking nearly 100 oz water daily,  Having nocturia.   Outpatient Medications Prior to Visit  Medication Sig Dispense Refill  . amLODipine (NORVASC) 10 MG tablet TAKE 1 TABLET(10 MG) BY MOUTH DAILY 90 tablet 1  . Ascorbic Acid (VITAMIN C) 100 MG tablet Take 100 mg by mouth daily.    Marland Kitchen aspirin 325 MG tablet Take 325 mg by mouth daily.    . Azelastine-Fluticasone (DYMISTA) 137-50 MCG/ACT SUSP Place 2 Squirts into the nose daily. In each nostril 23 g 11   . ergocalciferol (DRISDOL) 50000 units capsule Take 1 capsule (50,000 Units total) by mouth once a week. 4 capsule 2  . glipiZIDE (GLUCOTROL) 10 MG tablet TAKE ONE TABLET BY MOUTH TWICE DAILY BEFORE MEAL(S) 180 tablet 1  . glucosamine-chondroitin 500-400 MG tablet Take 1 tablet by mouth 3 (three) times daily.    . INS SYRINGE/NEEDLE 1CC/28G (B-D INSULIN SYRINGE 1CC/28G) 28G X 1/2" 1 ML MISC 1 Syringe by Does not apply route daily after supper. 100 each 0  . meloxicam (MOBIC) 15 MG tablet TAKE 1 TABLET(15 MG) BY MOUTH DAILY 90 tablet 1  . metFORMIN (GLUCOPHAGE) 1000 MG tablet Take 1 tablet (1,000 mg total) by mouth 2 (two) times daily with a meal. 180 tablet 1  . Potassium 99 MG TABS Take 1 tablet by mouth daily.    . pravastatin (PRAVACHOL) 40 MG tablet Take 1 tablet (40 mg total) by mouth daily. 90 tablet 1  . sildenafil (VIAGRA) 100 MG tablet Take 1 tablet (100 mg total) by mouth daily as needed for erectile dysfunction. 10 tablet 3  . Syringe/Needle, Disp, (SYRINGE 3CC/20GX1") 20G X 1" 3 ML MISC 1 Syringe by Does not apply route once a week. 24 each 1  . terbinafine (LAMISIL) 250 MG tablet Take 1 tablet (250 mg total) by mouth daily. 30 tablet 3  . testosterone cypionate (DEPOTESTOSTERONE CYPIONATE) 200 MG/ML injection Inject 1  mL (200 mg total) into the muscle every 14 (fourteen) days. 6 mL 4  . traMADol (ULTRAM) 50 MG tablet Take 1 tablet (50 mg total) by mouth every 6 (six) hours as needed. for pain 120 tablet 0  . lisinopril-hydrochlorothiazide (PRINZIDE,ZESTORETIC) 20-12.5 MG tablet TAKE 1 TABLET BY MOUTH DAILY 90 tablet 1  . losartan-hydrochlorothiazide (HYZAAR) 100-12.5 MG tablet Take 1 tablet by mouth daily. 90 tablet 3  . doxycycline (VIBRA-TABS) 100 MG tablet Take 1 tablet (100 mg total) by mouth 2 (two) times daily. (Patient not taking: Reported on 10/10/2017) 20 tablet 0   No facility-administered medications prior to visit.     Review of Systems;  Patient denies headache,  fevers, malaise, unintentional weight loss, skin rash, eye pain, sinus congestion and sinus pain, sore throat, dysphagia,  hemoptysis , cough, dyspnea, wheezing, chest pain, palpitations, orthopnea, edema, abdominal pain, nausea, melena, diarrhea, constipation, flank pain, dysuria, hematuria, urinary  Frequency, nocturia, numbness, tingling, seizures,  Focal weakness, Loss of consciousness,  Tremor, insomnia, depression, anxiety, and suicidal ideation.      Objective:  BP 140/74 (BP Location: Left Arm, Patient Position: Sitting, Cuff Size: Large)   Pulse 77   Temp 97.9 F (36.6 C) (Oral)   Resp 15   Ht 6\' 2"  (1.88 m)   Wt 298 lb 12.8 oz (135.5 kg)   SpO2 94%   BMI 38.36 kg/m   BP Readings from Last 3 Encounters:  10/10/17 140/74  09/22/17 (!) 156/80  10/27/16 124/66    Wt Readings from Last 3 Encounters:  10/10/17 298 lb 12.8 oz (135.5 kg)  09/22/17 296 lb (134.3 kg)  10/27/16 298 lb 12.8 oz (135.5 kg)    General appearance: alert, cooperative and appears stated age Ears right canal clean, with erythematous walls,  Eardrum intact . Left canal normal,  Clean,  normal TM's  Throat: lips, mucosa, and tongue normal; teeth and gums normal Neck: no adenopathy, no carotid bruit, supple, symmetrical, trachea midline and thyroid not enlarged, symmetric, no tenderness/mass/nodules Back: symmetric, no curvature. ROM normal. No CVA tenderness. Lungs: clear to auscultation bilaterally Heart: regular rate and rhythm, S1, S2 normal, no murmur, click, rub or gallop Abdomen: soft, non-tender; bowel sounds normal; no masses,  no organomegaly Pulses: 2+ and symmetric Skin: Skin color, texture, turgor normal. No rashes or lesions Lymph nodes: Cervical, supraclavicular, and axillary nodes normal.  Lab Results  Component Value Date   HGBA1C 5.6 09/22/2017   HGBA1C 6.4 07/28/2016   HGBA1C 5.7 04/28/2016    Lab Results  Component Value Date   CREATININE 1.26 09/22/2017   CREATININE 0.93  07/28/2016   CREATININE 1.10 04/28/2016    Lab Results  Component Value Date   WBC 7.2 09/22/2017   HGB 14.2 09/22/2017   HCT 41.2 09/22/2017   PLT 192 09/22/2017   GLUCOSE 118 (H) 09/22/2017   CHOL 159 07/28/2016   TRIG 118.0 07/28/2016   HDL 46.70 07/28/2016   LDLDIRECT 114 (H) 09/22/2017   LDLCALC 89 07/28/2016   ALT 17 09/22/2017   AST 18 09/22/2017   NA 139 09/22/2017   K 4.3 09/22/2017   CL 102 09/22/2017   CREATININE 1.26 09/22/2017   BUN 25 09/22/2017   CO2 27 09/22/2017   TSH 1.04 09/22/2017   PSA 1.8 09/22/2017   HGBA1C 5.6 09/22/2017   MICROALBUR 0.6 09/22/2017      Assessment & Plan:   Problem List Items Addressed This Visit    Acute eczematoid otitis externa of right  ear    Mometasone ointment  Once daily to canal. . Advised to stop using alcohol and peroxide.       DJD (degenerative joint disease) of knee - Primary    With prior resolution of pain following Synvisc injections.  Using meloxicam and tylenol .Referring to Orthopedics      Relevant Orders   AMB referral to orthopedics   Dry skin    Eliminating daily hctz.  Continue losartan. Prn furosemide       Spondylosis of lumbosacral spine with radiculopathy    Referral to orthopedics for evaluation       Other Visit Diagnoses    Spinal stenosis of lumbar region at multiple levels       Relevant Orders   AMB referral to orthopedics      I have discontinued Paz Asch Jr.'s doxycycline, losartan-hydrochlorothiazide, and lisinopril-hydrochlorothiazide. I am also having him start on losartan, mometasone, and furosemide. Additionally, I am having him maintain his vitamin C, glucosamine-chondroitin, sildenafil, INS SYRINGE/NEEDLE 1CC/28G, aspirin, Azelastine-Fluticasone, Potassium, ergocalciferol, traMADol, glipiZIDE, pravastatin, metFORMIN, SYRINGE 3CC/20GX1", terbinafine, meloxicam, amLODipine, and testosterone cypionate.  Meds ordered this encounter  Medications  . losartan (COZAAR)  100 MG tablet    Sig: Take 1 tablet (100 mg total) by mouth daily.    Dispense:  90 tablet    Refill:  3  . mometasone (ELOCON) 0.1 % ointment    Sig: Apply topically daily. To ear canals    Dispense:  45 g    Refill:  3  . furosemide (LASIX) 20 MG tablet    Sig: Take 1 tablet (20 mg total) by mouth daily. As needed for fluid retention    Dispense:  30 tablet    Refill:  3    Medications Discontinued During This Encounter  Medication Reason  . doxycycline (VIBRA-TABS) 100 MG tablet Completed Course  . lisinopril-hydrochlorothiazide (PRINZIDE,ZESTORETIC) 20-12.5 MG tablet   . losartan-hydrochlorothiazide (HYZAAR) 100-12.5 MG tablet     Follow-up: No follow-ups on file.   Crecencio Mc, MD

## 2017-10-10 NOTE — Assessment & Plan Note (Signed)
Eliminating daily hctz.  Continue losartan. Prn furosemide

## 2017-10-23 ENCOUNTER — Other Ambulatory Visit (INDEPENDENT_AMBULATORY_CARE_PROVIDER_SITE_OTHER): Payer: BLUE CROSS/BLUE SHIELD

## 2017-10-23 DIAGNOSIS — E1140 Type 2 diabetes mellitus with diabetic neuropathy, unspecified: Secondary | ICD-10-CM | POA: Diagnosis not present

## 2017-10-23 DIAGNOSIS — M170 Bilateral primary osteoarthritis of knee: Secondary | ICD-10-CM | POA: Diagnosis not present

## 2017-10-23 DIAGNOSIS — M25561 Pain in right knee: Secondary | ICD-10-CM | POA: Diagnosis not present

## 2017-10-23 DIAGNOSIS — M25562 Pain in left knee: Secondary | ICD-10-CM | POA: Diagnosis not present

## 2017-10-23 DIAGNOSIS — E349 Endocrine disorder, unspecified: Secondary | ICD-10-CM

## 2017-10-23 DIAGNOSIS — G8929 Other chronic pain: Secondary | ICD-10-CM | POA: Diagnosis not present

## 2017-10-23 NOTE — Addendum Note (Signed)
Addended by: Arby Barrette on: 10/23/2017 08:02 AM   Modules accepted: Orders

## 2017-10-28 LAB — TESTOS,TOTAL,FREE AND SHBG (FEMALE)
Free Testosterone: 94.6 pg/mL (ref 35.0–155.0)
Sex Hormone Binding: 14 nmol/L — ABNORMAL LOW (ref 22–77)
Testosterone, Total, LC-MS-MS: 396 ng/dL (ref 250–1100)

## 2017-10-28 LAB — HEMOGLOBIN A1C
Hgb A1c MFr Bld: 5.7 % of total Hgb — ABNORMAL HIGH (ref <5.7)
Mean Plasma Glucose: 117 (calc)
eAG (mmol/L): 6.5 (calc)

## 2017-11-06 DIAGNOSIS — M170 Bilateral primary osteoarthritis of knee: Secondary | ICD-10-CM | POA: Diagnosis not present

## 2017-12-15 ENCOUNTER — Other Ambulatory Visit: Payer: Self-pay | Admitting: Internal Medicine

## 2018-01-23 DIAGNOSIS — Z85828 Personal history of other malignant neoplasm of skin: Secondary | ICD-10-CM | POA: Diagnosis not present

## 2018-01-23 DIAGNOSIS — L570 Actinic keratosis: Secondary | ICD-10-CM | POA: Diagnosis not present

## 2018-01-23 DIAGNOSIS — D2262 Melanocytic nevi of left upper limb, including shoulder: Secondary | ICD-10-CM | POA: Diagnosis not present

## 2018-01-23 DIAGNOSIS — D2272 Melanocytic nevi of left lower limb, including hip: Secondary | ICD-10-CM | POA: Diagnosis not present

## 2018-01-23 DIAGNOSIS — D2261 Melanocytic nevi of right upper limb, including shoulder: Secondary | ICD-10-CM | POA: Diagnosis not present

## 2018-01-23 DIAGNOSIS — X32XXXA Exposure to sunlight, initial encounter: Secondary | ICD-10-CM | POA: Diagnosis not present

## 2018-01-25 DIAGNOSIS — H524 Presbyopia: Secondary | ICD-10-CM | POA: Diagnosis not present

## 2018-01-25 DIAGNOSIS — E119 Type 2 diabetes mellitus without complications: Secondary | ICD-10-CM | POA: Diagnosis not present

## 2018-01-25 DIAGNOSIS — H2513 Age-related nuclear cataract, bilateral: Secondary | ICD-10-CM | POA: Diagnosis not present

## 2018-01-25 DIAGNOSIS — H04123 Dry eye syndrome of bilateral lacrimal glands: Secondary | ICD-10-CM | POA: Diagnosis not present

## 2018-01-25 DIAGNOSIS — E089 Diabetes mellitus due to underlying condition without complications: Secondary | ICD-10-CM | POA: Diagnosis not present

## 2018-01-25 LAB — HM DIABETES EYE EXAM

## 2018-03-09 DIAGNOSIS — M1712 Unilateral primary osteoarthritis, left knee: Secondary | ICD-10-CM | POA: Diagnosis not present

## 2018-03-09 DIAGNOSIS — M1711 Unilateral primary osteoarthritis, right knee: Secondary | ICD-10-CM | POA: Diagnosis not present

## 2018-03-13 ENCOUNTER — Encounter: Payer: Self-pay | Admitting: Family Medicine

## 2018-03-13 ENCOUNTER — Ambulatory Visit (INDEPENDENT_AMBULATORY_CARE_PROVIDER_SITE_OTHER): Payer: BLUE CROSS/BLUE SHIELD | Admitting: Family Medicine

## 2018-03-13 VITALS — BP 158/88 | HR 97 | Temp 99.8°F | Resp 18 | Ht 74.0 in | Wt 289.0 lb

## 2018-03-13 DIAGNOSIS — J069 Acute upper respiratory infection, unspecified: Secondary | ICD-10-CM

## 2018-03-13 DIAGNOSIS — B9789 Other viral agents as the cause of diseases classified elsewhere: Secondary | ICD-10-CM

## 2018-03-13 MED ORDER — LORATADINE 10 MG PO TABS: 10.0000 mg | ORAL_TABLET | Freq: Every day | ORAL | 2 refills | Status: DC

## 2018-03-13 MED ORDER — GUAIFENESIN ER 600 MG PO TB12: 600.0000 mg | ORAL_TABLET | Freq: Two times a day (BID) | ORAL | 1 refills | Status: DC

## 2018-03-13 MED ORDER — FLUTICASONE PROPIONATE 50 MCG/ACT NA SUSP: 2.0000 | Freq: Every day | NASAL | 1 refills | Status: DC

## 2018-03-13 NOTE — Progress Notes (Signed)
Subjective:    Patient ID: David Pineda., male    DOB: Jun 03, 1955, 63 y.o.   MRN: [id number]  HPI  Presents to clinic c/o head congestion, low grade fever, cough for 3-4 days.   Patient has been using Nettie pot with success in reducing congestion.  Also did take dose of Sudafed last night, seemed to help get things draining.  However he usually avoids Mucinex because it can elevate his BP.  Denies shortness of breath or wheezing.  Denies nausea, vomiting or diarrhea.  Denies generalized body aches or chest pain.  Patient Active Problem List   Diagnosis Date Noted  . Acute eczematoid otitis externa of right ear 10/10/2017  . Spondylosis of lumbosacral spine with radiculopathy 10/10/2017  . Dry skin 10/10/2017  . Skin lesion of left upper extremity 04/30/2016  . Medial epicondylitis of left elbow 09/23/2015  . Headache 11/20/2014  . Allergic rhinitis 10/13/2014  . Onychomycosis of toenail 07/07/2014  . History of colonic polyps 07/04/2014  . History of shingles 06/29/2014  . Anxiety state 07/25/2013  . Chronic eustachian tube dysfunction 05/18/2013  . Special screening for malignant neoplasm of prostate 11/27/2012  . 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 Preventative health care 11/02/2012  . Chest pain, unspecified 11/01/2012  . History of CVA (cerebrovascular accident) 02/12/2012  . DJD (degenerative joint disease) of knee 02/12/2012  . OSA on CPAP   . Obesity, morbid, BMI 40.0-49.9 ([place])   . Diabetes mellitus with neuropathy ([place])   . Hyperlipidemia   . Hypotestosteronism   . Hypertension 11/08/2011   Social History   Tobacco Use  . Smoking status: Former Smoker    Packs/day: 3.00    Years: 7.00    Pack years: 21.00    Types: Cigarettes    Last attempt to quit: 11/17/1994    Years since quitting: 23.3  . Smokeless tobacco: Former Network engineer Use Topics  . Alcohol use: Yes   Review of Systems  Constitutional: +fever, decreased  energy HENT: +head congestion, sinus pain, drainage  Eyes: Negative.   Respiratory: +cough. Negative for shortness of breath and wheezing.   Cardiovascular: Negative for chest pain, palpitations and leg swelling.  Gastrointestinal: Negative for abdominal pain, diarrhea, nausea and vomiting.  Genitourinary: Negative for dysuria, frequency and urgency.  Musculoskeletal: Negative for arthralgias and myalgias.  Skin: Negative for color change, pallor and rash.  Neurological: Negative for syncope, light-headedness and headaches.  Psychiatric/Behavioral: The patient is not nervous/anxious.       Objective:   Physical Exam Vitals signs and nursing note reviewed.  Constitutional:      General: He is not in acute distress.    Appearance: He is not toxic-appearing.  HENT:     Head: Normocephalic and atraumatic.     Ears:     Comments: Mild fullness bilat TMs    Nose: Congestion and rhinorrhea (clear drainage) present.     Mouth/Throat:     Mouth: Mucous membranes are moist.     Pharynx: No oropharyngeal exudate or posterior oropharyngeal erythema.     Comments: +post nasal drip Eyes:     General: No scleral icterus.    Extraocular Movements: Extraocular movements intact.     Conjunctiva/sclera: Conjunctivae normal.     Pupils: Pupils are equal, round, and reactive to light.  Neck:     Musculoskeletal: Neck supple. No neck rigidity.  Cardiovascular:     Rate and Rhythm: Normal rate and regular rhythm.  Pulmonary:  Effort: Pulmonary effort is normal. No respiratory distress.     Breath sounds: Normal breath sounds. No wheezing, rhonchi or rales.  Lymphadenopathy:     Cervical: No cervical adenopathy.  Skin:    General: Skin is warm and dry.     Coloration: Skin is not jaundiced or pale.  Neurological:     Mental Status: He is alert and oriented to person, place, and time.  Psychiatric:        Mood and Affect: Mood normal.        Behavior: Behavior normal.    Vitals:    03/13/18 1328  BP: (!) 158/88  Pulse: 97  Resp: 18  Temp: 99.8 F (37.7 C)  SpO2: 95%      Assessment & Plan:    Viral URI with cough - patient's length of time of symptoms and physical exam are consistent with a viral URI.  Patient advised to avoid Sudafed as it can elevate BP.  Patient will begin loratadine 10 mg once daily to reduce congestion, use Flonase nasal spray and advised to continue the Nettie pot saline nasal rinses.  Patient also prescribed Mucinex to use to calm cough.  He will get plenty of rest, increase fluid intake and do good handwashing.  Patient advised that viral illnesses often take 7 to 10 days to fully to improve and cough can linger for 2 to 3 weeks (should be improving over this time frame).  Patient regularly scheduled follow-up with PCP as planned.  Advised return to clinic sooner if any issues arise or if he is not improving as expected.

## 2018-03-13 NOTE — Patient Instructions (Signed)

## 2018-03-14 DIAGNOSIS — G4733 Obstructive sleep apnea (adult) (pediatric): Secondary | ICD-10-CM | POA: Diagnosis not present

## 2018-03-16 ENCOUNTER — Telehealth: Payer: Self-pay

## 2018-03-16 NOTE — Telephone Encounter (Signed)
Jon Gills w/ [facility] calling to see if office has received the surgical clearance request for pt.  Stated fax was sent on Feb. 28.  Said pt has surgery scheduled for 04/20/18.  Call back number [phone].

## 2018-03-16 NOTE — Telephone Encounter (Signed)
Spoke with pt and has stated that he has been sick since last Friday and is not going to do anything until he gets better. The pt stated for [place] to call [facility] and have them put the surgery on hold for now because he really isn't sure they are who he wants to go with any ways.

## 2018-03-20 ENCOUNTER — Other Ambulatory Visit: Payer: Self-pay | Admitting: Internal Medicine

## 2018-04-05 ENCOUNTER — Other Ambulatory Visit: Payer: Self-pay | Admitting: Internal Medicine

## 2018-05-23 ENCOUNTER — Telehealth: Payer: Self-pay | Admitting: *Deleted

## 2018-05-23 DIAGNOSIS — M4727 Other spondylosis with radiculopathy, lumbosacral region: Secondary | ICD-10-CM

## 2018-05-23 NOTE — Telephone Encounter (Signed)
REFERRAL MADE TO [facility]

## 2018-05-23 NOTE — Telephone Encounter (Signed)
Copied from [place] [phone]. Topic: Referral - Request for Referral >> May 23, 2018  8:56 AM Alanda Slim E wrote: Has patient seen PCP for this complaint? Yes  *If NO, is insurance requiring patient see PCP for this issue before PCP can refer them? Referral for which specialty: massage therapy  Preferred provider/office: [facility] [phone] Reason for referral: Body pain, bad back and knees - was seeing her before virus happened

## 2018-05-24 ENCOUNTER — Telehealth: Payer: Self-pay | Admitting: Internal Medicine

## 2018-05-24 NOTE — Telephone Encounter (Signed)
Patient has been made aware.

## 2018-05-24 NOTE — Telephone Encounter (Signed)
Pt called [facility] back. Please call patient back.

## 2018-06-15 ENCOUNTER — Other Ambulatory Visit: Payer: Self-pay

## 2018-06-15 ENCOUNTER — Encounter: Payer: Self-pay | Admitting: Internal Medicine

## 2018-06-15 ENCOUNTER — Ambulatory Visit (INDEPENDENT_AMBULATORY_CARE_PROVIDER_SITE_OTHER): Payer: BC Managed Care – PPO | Admitting: Internal Medicine

## 2018-06-15 DIAGNOSIS — J029 Acute pharyngitis, unspecified: Secondary | ICD-10-CM

## 2018-06-15 DIAGNOSIS — M25562 Pain in left knee: Secondary | ICD-10-CM

## 2018-06-15 DIAGNOSIS — M4727 Other spondylosis with radiculopathy, lumbosacral region: Secondary | ICD-10-CM

## 2018-06-15 DIAGNOSIS — M25561 Pain in right knee: Secondary | ICD-10-CM

## 2018-06-15 DIAGNOSIS — M170 Bilateral primary osteoarthritis of knee: Secondary | ICD-10-CM

## 2018-06-15 DIAGNOSIS — G8929 Other chronic pain: Secondary | ICD-10-CM

## 2018-06-15 DIAGNOSIS — E1140 Type 2 diabetes mellitus with diabetic neuropathy, unspecified: Secondary | ICD-10-CM

## 2018-06-15 NOTE — Patient Instructions (Signed)
Based on your symptoms ,,  You should be tested for active covid 19 infection  I have made the referral so the facility will be calling you to set up a "drive through" swab test   You are due for labs but I cannot allow you into our facility until we are sure you are not infected.  I have made the referral to [place] [facility]

## 2018-06-15 NOTE — Progress Notes (Signed)
Virtual Visit [email or web address]  This visit type was conducted due to national recommendations for restrictions regarding the COVID-19 pandemic (e.g. social distancing).  This format is felt to be most appropriate for this patient at this time.  All issues noted in this document were discussed and addressed.  No physical exam was performed (except for noted visual exam findings with Video Visits).   I connected with@ on 06/15/18 at  9:30 AM EDT by a video enabled telemedicine application or telephone and verified that I am speaking with the correct person using two identifiers. Location patient: home Location provider: work or home office Persons participating in the virtual visit: patient, provider  I discussed the limitations, risks, security and privacy concerns of performing an evaluation and management service by telephone and the availability of in person appointments. I also discussed with the patient that there may be a patient responsible charge related to this service. The patient expressed understanding and agreed to proceed.  Reason for visit: tender lymph node neck,, follow up on multple chronic conditions, last seen Oct 2019 .     HPI:   63 yr old male with history of Type 2 DM with neuropathy morbid obesity , chronic  low back pain presents with a tender swollen cervical lymph node  On the right that has been present for about a week.  He was treated for viral uri with cough by [facility] on March 3. Symptoms persisted for 3 weeks and were accompanied by a 15 lb weight loss.  He deferred the knee surgery with [facility] and is now considering a second opinion.   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 Patient denies contact with other persons with the above mentioned symptoms or with anyone  confirmed to have COVID 19 DM follow up.   Type 2 DM:  Last labs October,  a1c was 5.7 .  Checking sugars rarely.  Following a low GI diet.   Bilateral knee pain:  Saw [facility] for DJD knees. Felt he was somewhat pushed into signing up for surgery before deciding for himself or having a F to F with surgeon.  Has deferred the surgery and requesting 2nd opinion from [place].   Hypertension: patient checks blood pressure twice weekly at home.  Readings have been for the most part < 140/80 at rest . Patient is following a reduced salt diet most days and is taking medications as prescribed. Patient has been advised that evening administration of anti hypertensive medication  has been shown to reduce incidence of AMI and CVA.    ROS: See pertinent positives and negatives per HPI.  Past Medical History:  Diagnosis Date  . Arthritis   . Diabetes mellitus without complication ([place])   . Elevated blood pressure reading   . Hyperlipidemia   . Hypertension   . Hypotestosteronism   . Obesity, morbid, BMI 40.0-49.9 ([place])   . OSA on CPAP     Past Surgical History:  Procedure Laterality Date  . FOOT SURGERY     left     Family History  Adopted: Yes  Problem Relation Age of Onset  . Diabetes Mother   . Hyperlipidemia Father   . Hypertension Father     SOCIAL HX:  Divorced,  Self employed .  No tobacco.  Some moderate use of alcohol  Social History   Social History Narrative  . Not on file     Current Outpatient Medications:  .  amLODipine (NORVASC) 10 MG tablet, TAKE 1 TABLET(10 MG) BY MOUTH DAILY, Disp: 90 tablet, Rfl: 1 .  Ascorbic Acid (VITAMIN C) 100 MG tablet, Take 100 mg by mouth daily., Disp: , Rfl:  .  aspirin 325 MG tablet, Take 325 mg by mouth daily., Disp: , Rfl:  .  Azelastine-Fluticasone (DYMISTA) 137-50 MCG/ACT SUSP, Place 2 Squirts into the nose daily. In each nostril, Disp: 23 g, Rfl: 11 .  ergocalciferol (DRISDOL) 50000 units capsule, Take 1 capsule (50,000 Units  total) by mouth once a week., Disp: 4 capsule, Rfl: 2 .  fluticasone (FLONASE) 50 MCG/ACT nasal spray, Place 2 sprays into both nostrils daily., Disp: 16 g, Rfl: 1 .  furosemide (LASIX) 20 MG tablet, Take 1 tablet (20 mg total) by mouth daily. As needed for fluid retention, Disp: 30 tablet, Rfl: 3 .  glipiZIDE (GLUCOTROL) 10 MG tablet, TAKE ONE TABLET BY MOUTH TWICE DAILY BEFORE MEAL(S), Disp: 180 tablet, Rfl: 1 .  glucosamine-chondroitin 500-400 MG tablet, Take 1 tablet by mouth 3 (three) times daily., Disp: , Rfl:  .  guaiFENesin (MUCINEX) 600 MG 12 hr tablet, Take 1 tablet (600 mg total) by mouth 2 (two) times daily., Disp: 20 tablet, Rfl: 1 .  INS SYRINGE/NEEDLE 1CC/28G (B-D INSULIN SYRINGE 1CC/28G) 28G X 1/2" 1 ML MISC, 1 Syringe by Does not apply route daily after supper., Disp: 100 each, Rfl: 0 .  loratadine (CLARITIN) 10 MG tablet, Take 1 tablet (10 mg total) by mouth daily. For nasal congestion, Disp: 30 tablet, Rfl: 2 .  losartan (COZAAR) 100 MG tablet, Take 1 tablet (100 mg total) by mouth daily., Disp: 90 tablet, Rfl: 3 .  meloxicam (MOBIC) 15 MG tablet, TAKE 1 TABLET(15 MG) BY MOUTH DAILY, Disp: 90 tablet, Rfl: 1 .  metFORMIN (GLUCOPHAGE) 1000 MG tablet, TAKE 1 TABLET(1000 MG) BY MOUTH TWICE DAILY WITH A MEAL, Disp: 180 tablet, Rfl: 0 .  mometasone (ELOCON) 0.1 % ointment, Apply topically daily. To ear canals, Disp: 45 g, Rfl: 3 .  Potassium 99 MG TABS, Take 1 tablet by mouth daily., Disp: , Rfl:  .  pravastatin (PRAVACHOL) 40 MG tablet, Take 1 tablet (40 mg total) by mouth daily., Disp: 90 tablet, Rfl: 1 .  sildenafil (VIAGRA) 100 MG tablet, Take 1 tablet (100 mg total) by mouth daily as needed for erectile dysfunction., Disp: 10 tablet, Rfl: 3 .  Syringe/Needle, Disp, (SYRINGE 3CC/20GX1") 20G X 1" 3 ML MISC, 1 Syringe by Does not apply route once a week., Disp: 24 each, Rfl: 1 .  terbinafine (LAMISIL) 250 MG tablet, Take 1 tablet (250 mg total) by mouth daily., Disp: 30 tablet, Rfl:  3 .  testosterone cypionate (DEPOTESTOSTERONE CYPIONATE) 200 MG/ML injection, Inject 1 mL (200 mg total) into the muscle every 14 (fourteen) days., Disp: 6 mL, Rfl: 4 .  traMADol (ULTRAM) 50 MG tablet, Take 1 tablet (50 mg total) by mouth every 6 (six) hours as needed. for pain, Disp: 120 tablet, Rfl: 0  EXAM:  VITALS per patient if applicable:  GENERAL: alert, oriented, appears well and in no acute distress  HEENT: atraumatic, conjunttiva clear, no obvious abnormalities on inspection of external nose and ears  NECK: normal movements of the head and neck  LUNGS: on inspection no signs of respiratory  distress, breathing rate appears normal, no obvious gross SOB, gasping or wheezing  CV: no obvious cyanosis  MS: moves all visible extremities without noticeable abnormality  PSYCH/NEURO: pleasant and cooperative, no obvious depression or anxiety, speech and thought processing grossly intact  ASSESSMENT AND PLAN:  Discussed the following assessment and plan:  Bilateral chronic knee pain - Plan: Ambulatory referral to Orthopedic Surgery  Obesity, morbid, BMI 40.0-49.9 (HCC)  Acute pharyngitis, unspecified etiology  Primary osteoarthritis of both knees  Spondylosis of lumbosacral spine with radiculopathy  Type 2 diabetes mellitus with diabetic neuropathy, without long-term current use of insulin (H[facility])  Obesity, morbid, BMI 40.0-49.9 I have congratulated him in maintaining the reduction of   BMI he achieved in 2018  encouraged  Continued weight loss with goal of 10% of body weigh over the next 6 months using a low glycemic index diet and regular exercise a minimum of 5 days per week. He had an involuntary weight loss of 15 lbs in march during his viral illness    Pharyngitis, acute Long discussion regarding his need  to have COVID 19 antibody testing to rlue out past infection in March vs testing for acute infection currently given lymphadenopathy with pharyngitis, body aches. He  is apprehensive about being tested and is self isolating.  DJD (degenerative joint disease) of knee With return of pain following transient  resolution of pain with the use of Synvisc injections.  Using meloxicam and tylenol . Has been seen by [facility] and surgery recommended but deferred by patient. Referring to [place] patient request   Diabetes mellitus with neuropathy [facility]) He is overdue for 6 month testing,  But due to his current symptoms he needs to be ruled out for COVID 19 INFECTION before he can enter the clinic. He is reluctant to do this despite discussion today     I discussed the assessment and treatment plan with the patient. The patient was provided an opportunity to ask questions and all were answered. The patient agreed with the plan and demonstrated an understanding of the instructions.   The patient was advised to call back or seek an in-person evaluation if the symptoms worsen or if the condition fails to improve as anticipated.  I provided 40  minutes of non-face-to-face time during this encounter.   Crecencio Mc, MD

## 2018-06-17 ENCOUNTER — Encounter: Payer: Self-pay | Admitting: Internal Medicine

## 2018-06-17 DIAGNOSIS — J029 Acute pharyngitis, unspecified: Secondary | ICD-10-CM | POA: Insufficient documentation

## 2018-06-17 NOTE — Assessment & Plan Note (Signed)
He is overdue for 6 month testing,  But due to his current symptoms he needs to be ruled out for COVID 19 INFECTION before he can enter the clinic. He is reluctant to do this despite discussion today

## 2018-06-17 NOTE — Assessment & Plan Note (Addendum)
With return of pain following transient  resolution of pain with the use of Synvisc injections.  Using meloxicam and tylenol . Has been seen by [facility] and surgery recommended but deferred by patient. [facility]

## 2018-06-17 NOTE — Assessment & Plan Note (Signed)
Long discussion regarding his need  to have COVID 19 antibody testing to rlue out past infection in March vs testing for acute infection currently given lymphadenopathy with pharyngitis, body aches. He is apprehensive about being tested and is self isolating.

## 2018-06-17 NOTE — Assessment & Plan Note (Addendum)
I have congratulated him in maintaining the reduction of   BMI he achieved in 2018  encouraged  Continued weight loss with goal of 10% of body weigh over the next 6 months using a low glycemic index diet and regular exercise a minimum of 5 days per week. He had an involuntary weight loss of 15 lbs in march during his viral illness

## 2018-06-18 DIAGNOSIS — G4733 Obstructive sleep apnea (adult) (pediatric): Secondary | ICD-10-CM | POA: Diagnosis not present

## 2018-07-30 DIAGNOSIS — M1711 Unilateral primary osteoarthritis, right knee: Secondary | ICD-10-CM | POA: Diagnosis not present

## 2018-07-30 DIAGNOSIS — M25561 Pain in right knee: Secondary | ICD-10-CM | POA: Diagnosis not present

## 2018-07-30 DIAGNOSIS — G8929 Other chronic pain: Secondary | ICD-10-CM | POA: Diagnosis not present

## 2018-07-30 DIAGNOSIS — M1712 Unilateral primary osteoarthritis, left knee: Secondary | ICD-10-CM | POA: Diagnosis not present

## 2018-07-30 DIAGNOSIS — M25562 Pain in left knee: Secondary | ICD-10-CM | POA: Diagnosis not present

## 2018-08-16 ENCOUNTER — Telehealth: Payer: Self-pay | Admitting: Internal Medicine

## 2018-08-16 MED ORDER — GLIPIZIDE 5 MG PO TABS: 5.0000 mg | ORAL_TABLET | Freq: Two times a day (BID) | ORAL | 1 refills | Status: DC

## 2018-08-16 NOTE — Telephone Encounter (Signed)
glipiZIDE (GLUCOTROL)   Pt need new prescription for 5mg    Sent to [facility]

## 2018-08-16 NOTE — Telephone Encounter (Signed)
Glipizide 5mg  has been sent in to pharmacy. Discontinued the 10mg  from his medication list. Pt stated that he has been taking 5mg  since 07/2016(it is in Dr. Lupita Dawn office note to reduce dose to 5mg ) pt had just got a 90 supply of the 10mg  so he was just cutting them in half until he ran out. Pt is aware that the 5mg  tablets are being sent to pharmacy instead of the 10mg .

## 2018-08-17 ENCOUNTER — Other Ambulatory Visit: Payer: Self-pay | Admitting: Internal Medicine

## 2018-08-20 NOTE — Telephone Encounter (Signed)
Refilled: 09/22/2017 Last OV: 06/15/2018 Next OV: not scheduled

## 2018-10-23 ENCOUNTER — Telehealth: Payer: Self-pay | Admitting: Internal Medicine

## 2018-10-23 NOTE — Telephone Encounter (Signed)
Lm on patients vm to call office to set up a flu injection with nurse and schedule a virtual appointment, follow up with Dr. Derrel Nip.

## 2018-10-26 ENCOUNTER — Other Ambulatory Visit: Payer: Self-pay

## 2018-10-26 DIAGNOSIS — Z20828 Contact with and (suspected) exposure to other viral communicable diseases: Secondary | ICD-10-CM | POA: Diagnosis not present

## 2018-10-26 DIAGNOSIS — Z20822 Contact with and (suspected) exposure to covid-19: Secondary | ICD-10-CM

## 2018-10-27 LAB — NOVEL CORONAVIRUS, NAA: SARS-CoV-2, NAA: DETECTED — AB

## 2018-10-28 ENCOUNTER — Telehealth: Payer: Self-pay | Admitting: Family Medicine

## 2018-10-28 NOTE — Telephone Encounter (Signed)
Please call patient on Monday re follow up o n positive COVID TEST

## 2018-10-28 NOTE — Telephone Encounter (Signed)
Received call re: positive covid test  Call went to voicemail- left message with results and inst to isolate himself and to go to ED if symptoms become severe  Will route to PCP

## 2018-10-29 ENCOUNTER — Ambulatory Visit (INDEPENDENT_AMBULATORY_CARE_PROVIDER_SITE_OTHER): Payer: BC Managed Care – PPO | Admitting: Internal Medicine

## 2018-10-29 ENCOUNTER — Encounter: Payer: Self-pay | Admitting: Internal Medicine

## 2018-10-29 ENCOUNTER — Other Ambulatory Visit: Payer: Self-pay

## 2018-10-29 DIAGNOSIS — U071 COVID-19: Secondary | ICD-10-CM

## 2018-10-29 HISTORY — DX: COVID-19: U07.1

## 2018-10-29 NOTE — Progress Notes (Signed)
Virtual Visit via  [email or web address] Note  This visit type was conducted due to national recommendations for restrictions regarding the COVID-19 pandemic (e.g. social distancing).  This format is felt to be most appropriate for this patient at this time.  All issues noted in this document were discussed and addressed.  No physical exam was performed (except for noted visual exam findings with Video Visits).   I connected with@ on 10/29/18 at 11:30 AM EDT by a video enabled telemedicine application and verified that I am speaking with the correct person using two identifiers. Location patient: home Location provider: work or home office Persons participating in the virtual visit: patient, provider  I discussed the limitations, risks, security and privacy concerns of performing an evaluation and management service by telephone and the availability of in person appointments. I also discussed with the patient that there may be a patient responsible charge related to this service. The patient expressed understanding and agreed to proceed.  Reason for visit: FOLLOW UP ON POSITIVE COVID 19 TEST   HPI:   63 yr old male with T2DM,  morbid obesity,  Presents with POSITIVE COVID 19 TEST from testing date of October 16.    Patient was tested due to an exposure.  His x wife tested positive on Oct 14th and he and his son had been around her unmasked.  Patient starting quarantining on Oct 16.  He has had a mld headache,  sore throat,  Infrequent dry cough and denies any fevers,  Body aches, dyspnea at rest or with imild exertion ,  altered sense of taste or smell  and diarrhea   ROS: See pertinent positives and negatives per HPI.  Past Medical History:  Diagnosis Date  . Arthritis   . Diabetes mellitus without complication ([place])   . Elevated blood pressure reading   . Hyperlipidemia   . Hypertension   . Hypotestosteronism   . Obesity, morbid, BMI 40.0-49.9 ([place])   . OSA on CPAP     Past Surgical History:   Procedure Laterality Date  . FOOT SURGERY     left     Family History  Adopted: Yes  Problem Relation Age of Onset  . Diabetes Mother   . Hyperlipidemia Father   . Hypertension Father     SOCIAL HX:  reports that he quit smoking about 23 years ago. His smoking use included cigarettes. He has a 21.00 pack-year smoking history. He has quit using smokeless tobacco. He reports current alcohol use of about 14.0 standard drinks of alcohol per week. He reports that he does not use drugs.   Current Outpatient Medications:  .  amLODipine (NORVASC) 10 MG tablet, TAKE 1 TABLET(10 MG) BY MOUTH DAILY, Disp: 90 tablet, Rfl: 1 .  Ascorbic Acid (VITAMIN C) 100 MG tablet, Take 100 mg by mouth daily., Disp: , Rfl:  .  aspirin 325 MG tablet, Take 325 mg by mouth daily., Disp: , Rfl:  .  Azelastine-Fluticasone (DYMISTA) 137-50 MCG/ACT SUSP, Place 2 Squirts into the nose daily. In each nostril, Disp: 23 g, Rfl: 11 .  ergocalciferol (DRISDOL) 50000 units capsule, Take 1 capsule (50,000 Units total) by mouth once a week., Disp: 4 capsule, Rfl: 2 .  fluticasone (FLONASE) 50 MCG/ACT nasal spray, Place 2 sprays into both nostrils daily., Disp: 16 g, Rfl: 1 .  furosemide (LASIX) 20 MG tablet, Take 1 tablet (20 mg total) by mouth daily. As needed for fluid retention, Disp: 30 tablet, Rfl: 3 .  glipiZIDE (GLUCOTROL)  5 MG tablet, Take 1 tablet (5 mg total) by mouth 2 (two) times daily before a meal., Disp: 180 tablet, Rfl: 1 .  glucosamine-chondroitin 500-400 MG tablet, Take 1 tablet by mouth 3 (three) times daily., Disp: , Rfl:  .  guaiFENesin (MUCINEX) 600 MG 12 hr tablet, Take 1 tablet (600 mg total) by mouth 2 (two) times daily., Disp: 20 tablet, Rfl: 1 .  INS SYRINGE/NEEDLE 1CC/28G (B-D INSULIN SYRINGE 1CC/28G) 28G X 1/2" 1 ML MISC, 1 Syringe by Does not apply route daily after supper., Disp: 100 each, Rfl: 0 .  loratadine (CLARITIN) 10 MG tablet, Take 1 tablet (10 mg total) by mouth daily. For nasal  congestion, Disp: 30 tablet, Rfl: 2 .  losartan (COZAAR) 100 MG tablet, Take 1 tablet (100 mg total) by mouth daily., Disp: 90 tablet, Rfl: 3 .  meloxicam (MOBIC) 15 MG tablet, TAKE 1 TABLET(15 MG) BY MOUTH DAILY, Disp: 90 tablet, Rfl: 1 .  metFORMIN (GLUCOPHAGE) 1000 MG tablet, TAKE 1 TABLET(1000 MG) BY MOUTH TWICE DAILY WITH A MEAL, Disp: 180 tablet, Rfl: 1 .  mometasone (ELOCON) 0.1 % ointment, Apply topically daily. To ear canals, Disp: 45 g, Rfl: 3 .  Potassium 99 MG TABS, Take 1 tablet by mouth daily., Disp: , Rfl:  .  pravastatin (PRAVACHOL) 40 MG tablet, Take 1 tablet (40 mg total) by mouth daily., Disp: 90 tablet, Rfl: 1 .  sildenafil (VIAGRA) 100 MG tablet, Take 1 tablet (100 mg total) by mouth daily as needed for erectile dysfunction., Disp: 10 tablet, Rfl: 3 .  Syringe/Needle, Disp, (SYRINGE 3CC/20GX1") 20G X 1" 3 ML MISC, 1 Syringe by Does not apply route once a week., Disp: 24 each, Rfl: 1 .  terbinafine (LAMISIL) 250 MG tablet, Take 1 tablet (250 mg total) by mouth daily., Disp: 30 tablet, Rfl: 3 .  testosterone cypionate (DEPOTESTOSTERONE CYPIONATE) 200 MG/ML injection, INJECT 1 ML( 200 MG TOTAL) IN THE MUSCLE EVERY 14 DAYS, Disp: 6 mL, Rfl: 1 .  traMADol (ULTRAM) 50 MG tablet, Take 1 tablet (50 mg total) by mouth every 6 (six) hours as needed. for pain, Disp: 120 tablet, Rfl: 0  EXAM:  VITALS per patient if applicable:  GENERAL: alert, oriented, appears well and in no acute distress  HEENT: atraumatic, conjunttiva clear, no obvious abnormalities on inspection of external nose and ears  NECK: normal movements of the head and neck  LUNGS: on inspection no signs of respiratory distress, breathing rate appears normal, no obvious gross SOB, gasping or wheezing  CV: no obvious cyanosis  MS: moves all visible extremities without noticeable abnormality  PSYCH/NEURO: pleasant and cooperative, no obvious depression or anxiety, speech and thought processing grossly  intact  ASSESSMENT AND PLAN:  Discussed the following assessment and plan:  COVID-19 virus infection  COVID-19 virus infection patient was advised to continue quarantine until Oct 23d quarantine  and only suspend quarantine if all symptoms are improving ad he is afebrile. He currently endorses only a mild cough,  No dyspnea or severe headache.    I discussed the assessment and treatment plan with the patient. The patient was provided an opportunity to ask questions and all were answered. The patient agreed with the plan and demonstrated an understanding of the instructions.   The patient was advised to call back or seek an in-person evaluation if the symptoms worsen or if the condition fails to improve as anticipated.    I provided  25 minutes of non-face-to-face time during this encounter reviewing  patient's current problems, Providing counseling on the above mentioned problems , and coordination  of care .     Crecencio Mc, MD

## 2018-10-29 NOTE — Assessment & Plan Note (Signed)
patient was advised to continue quarantine until Oct 23d quarantine  and only suspend quarantine if all symptoms are improving ad he is afebrile. He currently endorses only a mild cough,  No dyspnea or severe headache.

## 2018-10-29 NOTE — Telephone Encounter (Signed)
Spoke with pt and he is scheduled for a virtual visit with Dr. Derrel Nip this morning at 11:30am.

## 2018-12-04 ENCOUNTER — Other Ambulatory Visit: Payer: Self-pay

## 2018-12-04 ENCOUNTER — Other Ambulatory Visit: Payer: Self-pay | Admitting: Internal Medicine

## 2018-12-04 MED ORDER — MELOXICAM 15 MG PO TABS: ORAL_TABLET | ORAL | 1 refills | Status: DC

## 2019-02-11 DIAGNOSIS — M170 Bilateral primary osteoarthritis of knee: Secondary | ICD-10-CM | POA: Diagnosis not present

## 2019-02-11 DIAGNOSIS — Z6841 Body Mass Index (BMI) 40.0 and over, adult: Secondary | ICD-10-CM | POA: Diagnosis not present

## 2019-02-11 DIAGNOSIS — Z87891 Personal history of nicotine dependence: Secondary | ICD-10-CM | POA: Diagnosis not present

## 2019-02-11 DIAGNOSIS — E119 Type 2 diabetes mellitus without complications: Secondary | ICD-10-CM | POA: Diagnosis not present

## 2019-02-20 ENCOUNTER — Ambulatory Visit (INDEPENDENT_AMBULATORY_CARE_PROVIDER_SITE_OTHER): Payer: BC Managed Care – PPO | Admitting: Internal Medicine

## 2019-02-20 ENCOUNTER — Encounter: Payer: Self-pay | Admitting: Internal Medicine

## 2019-02-20 ENCOUNTER — Other Ambulatory Visit: Payer: Self-pay

## 2019-02-20 VITALS — BP 150/74 | HR 78 | Ht 74.0 in | Wt 296.0 lb

## 2019-02-20 DIAGNOSIS — E1140 Type 2 diabetes mellitus with diabetic neuropathy, unspecified: Secondary | ICD-10-CM | POA: Diagnosis not present

## 2019-02-20 DIAGNOSIS — I10 Essential (primary) hypertension: Secondary | ICD-10-CM

## 2019-02-20 DIAGNOSIS — N529 Male erectile dysfunction, unspecified: Secondary | ICD-10-CM

## 2019-02-20 DIAGNOSIS — E782 Mixed hyperlipidemia: Secondary | ICD-10-CM

## 2019-02-20 DIAGNOSIS — R300 Dysuria: Secondary | ICD-10-CM | POA: Insufficient documentation

## 2019-02-20 DIAGNOSIS — Z125 Encounter for screening for malignant neoplasm of prostate: Secondary | ICD-10-CM | POA: Diagnosis not present

## 2019-02-20 DIAGNOSIS — R3911 Hesitancy of micturition: Secondary | ICD-10-CM | POA: Diagnosis not present

## 2019-02-20 MED ORDER — HYDROCHLOROTHIAZIDE 25 MG PO TABS: 25.0000 mg | ORAL_TABLET | Freq: Every day | ORAL | 3 refills | Status: AC

## 2019-02-20 NOTE — Progress Notes (Signed)
Virtual Visit via [email or web address]  This visit type was conducted due to national recommendations for restrictions regarding the COVID-19 pandemic (e.g. social distancing).  This format is felt to be most appropriate for this patient at this time.  All issues noted in this document were discussed and addressed.  No physical exam was performed (except for noted visual exam findings with Video Visits).   I connected with@ on 02/20/19 at  8:30 AM EST by a video enabled telemedicine application and verified that I am speaking with the correct person using two identifiers. Location patient: home Location provider: work or home office Persons participating in the virtual visit: patient, provider  I discussed the limitations, risks, security and privacy concerns of performing an evaluation and management service by telephone and the availability of in person appointments. I also discussed with the patient that there may be a patient responsible charge related to this service. The patient expressed understanding and agreed to proceed.   Reason for visit: follow up  HPI:  64 yr old male with T2DM, hypertension, obesity , Lost to follow up for over one year  Glucometer not working      Has Gained weight , was 311 lbs recently during Orthopedics visit for bilateral knee pain.  Advised to lose weight as a prerequisite for surgery.    Has lost 14 in 2 weeks giving up carbs.    HTN:  Taking medications Blood pressures high 150/78 at home on amlodipine and losartan  Urinary hesitancy,  Nocturia x   2,  Feels a Slight burning during intial seconds of each void  No sexual encounters in "years.'  Stopped testosterone 1 month ago   (felt using it was pointless )   ROS: See pertinent positives and negatives per HPI.  Past Medical History:  Diagnosis Date  . Arthritis   . COVID-19 virus infection 10/29/2018  . Diabetes mellitus without complication ([place])   . Elevated blood pressure reading   . Hyperlipidemia    . Hypertension   . Hypotestosteronism   . Obesity, morbid, BMI 40.0-49.9 ([place])   . OSA on CPAP     Past Surgical History:  Procedure Laterality Date  . FOOT SURGERY     left     Family History  Adopted: Yes  Problem Relation Age of Onset  . Diabetes Mother   . Hyperlipidemia Father   . Hypertension Father     SOCIAL HX:  reports that he quit smoking about 24 years ago. His smoking use included cigarettes. He has a 21.00 pack-year smoking history. He has quit using smokeless tobacco. He reports current alcohol use of about 14.0 standard drinks of alcohol per week. He reports that he does not use drugs.   Current Outpatient Medications:  .  amLODipine (NORVASC) 10 MG tablet, TAKE 1 TABLET(10 MG) BY MOUTH DAILY, Disp: 90 tablet, Rfl: 1 .  Ascorbic Acid (VITAMIN C) 100 MG tablet, Take 100 mg by mouth daily., Disp: , Rfl:  .  aspirin 325 MG tablet, Take 325 mg by mouth daily., Disp: , Rfl:  .  Azelastine-Fluticasone (DYMISTA) 137-50 MCG/ACT SUSP, Place 2 Squirts into the nose daily. In each nostril, Disp: 23 g, Rfl: 11 .  ergocalciferol (DRISDOL) 50000 units capsule, Take 1 capsule (50,000 Units total) by mouth once a week., Disp: 4 capsule, Rfl: 2 .  ergocalciferol (VITAMIN D2) 1.25 MG (50000 UT) capsule, Take by mouth., Disp: , Rfl:  .  fluticasone (FLONASE) 50 MCG/ACT nasal spray, Place 2 sprays  into both nostrils daily., Disp: 16 g, Rfl: 1 .  furosemide (LASIX) 20 MG tablet, Take 1 tablet (20 mg total) by mouth daily. As needed for fluid retention, Disp: 30 tablet, Rfl: 3 .  glipiZIDE (GLUCOTROL) 5 MG tablet, Take 1 tablet (5 mg total) by mouth 2 (two) times daily before a meal., Disp: 180 tablet, Rfl: 1 .  glucosamine-chondroitin 500-400 MG tablet, Take 1 tablet by mouth 3 (three) times daily., Disp: , Rfl:  .  guaiFENesin (MUCINEX) 600 MG 12 hr tablet, Take 1 tablet (600 mg total) by mouth 2 (two) times daily., Disp: 20 tablet, Rfl: 1 .  INS SYRINGE/NEEDLE 1CC/28G (B-D INSULIN  SYRINGE 1CC/28G) 28G X 1/2" 1 ML MISC, 1 Syringe by Does not apply route daily after supper., Disp: 100 each, Rfl: 0 .  loratadine (CLARITIN) 10 MG tablet, Take 1 tablet (10 mg total) by mouth daily. For nasal congestion, Disp: 30 tablet, Rfl: 2 .  losartan (COZAAR) 100 MG tablet, TAKE 1 TABLET(100 MG) BY MOUTH DAILY, Disp: 90 tablet, Rfl: 3 .  meloxicam (MOBIC) 15 MG tablet, TAKE 1 TABLET(15 MG) BY MOUTH DAILY, Disp: 90 tablet, Rfl: 1 .  metFORMIN (GLUCOPHAGE) 1000 MG tablet, TAKE 1 TABLET(1000 MG) BY MOUTH TWICE DAILY WITH A MEAL, Disp: 180 tablet, Rfl: 1 .  mometasone (ELOCON) 0.1 % ointment, Apply topically daily. To ear canals, Disp: 45 g, Rfl: 3 .  Potassium 99 MG TABS, Take 1 tablet by mouth daily., Disp: , Rfl:  .  pravastatin (PRAVACHOL) 40 MG tablet, Take 1 tablet (40 mg total) by mouth daily., Disp: 90 tablet, Rfl: 1 .  sildenafil (VIAGRA) 100 MG tablet, Take 1 tablet (100 mg total) by mouth daily as needed for erectile dysfunction., Disp: 10 tablet, Rfl: 3 .  Syringe/Needle, Disp, (SYRINGE 3CC/20GX1") 20G X 1" 3 ML MISC, 1 Syringe by Does not apply route once a week., Disp: 24 each, Rfl: 1 .  terbinafine (LAMISIL) 250 MG tablet, Take 1 tablet (250 mg total) by mouth daily., Disp: 30 tablet, Rfl: 3 .  testosterone cypionate (DEPOTESTOSTERONE CYPIONATE) 200 MG/ML injection, INJECT 1 ML( 200 MG TOTAL) IN THE MUSCLE EVERY 14 DAYS, Disp: 6 mL, Rfl: 1 .  traMADol (ULTRAM) 50 MG tablet, Take 1 tablet (50 mg total) by mouth every 6 (six) hours as needed. for pain, Disp: 120 tablet, Rfl: 0 .  hydrochlorothiazide (HYDRODIURIL) 25 MG tablet, Take 1 tablet (25 mg total) by mouth daily., Disp: 90 tablet, Rfl: 3  EXAM:  VITALS per patient if applicable:  GENERAL: alert, oriented, appears well and in no acute distress  HEENT: atraumatic, conjunctiva clear, no obvious abnormalities on inspection of external nose and ears  NECK: normal movements of the head and neck  LUNGS: on inspection no signs  of respiratory distress, breathing rate appears normal, no obvious gross SOB, gasping or wheezing  CV: no obvious cyanosis  MS: moves all visible extremities without noticeable abnormality  PSYCH/NEURO: pleasant and cooperative, no obvious depression or anxiety, speech and thought processing grossly intact  ASSESSMENT AND PLAN:  Discussed the following assessment and plan:  Mixed hyperlipidemia - Plan: Lipid panel  Type 2 diabetes mellitus with diabetic neuropathy, without long-term current use of insulin ([facility]) - Plan: Hemoglobin A1c, Comprehensive metabolic panel, Microalbumin / creatinine urine ratio  Prostate cancer screening - Plan: PSA  Urinary hesitancy - Plan: Urinalysis, Routine w reflex microscopic, Urine Culture  Essential hypertension  Obesity, morbid, BMI 40.0-49.9 ([facility])  Dysuria  Impotence due to  erectile dysfunction  Hypertension Not at goal on amlodipine and losartan.   Adding hctz   Obesity, morbid, BMI 40.0-49.9 I have congratulated him in reduction of   BMI and encouraged  Continued weight loss with goal of 10% of body weight over the next 6 months using a low glycemic index diet and regular exercise a minimum of 5 days per week.    Diabetes mellitus with neuropathy ([place]) He is overdue for testing,  Last A1c oct 2019, and has not been adherent to low GI diet until 2 weeks ago after a weight gain of 16 lbs    Lab Results  Component Value Date   HGBA1C 5.7 (H) 10/23/2017     Dysuria Ruling out UTI and prostatitis ,   Impotence due to erectile dysfunction Treated previously with testosterone supplementation , which he has now stopped by choice .     I discussed the assessment and treatment plan with the patient. The patient was provided an opportunity to ask questions and all were answered. The patient agreed with the plan and demonstrated an understanding of the instructions.   The patient was advised to call back or seek an in-person evaluation if  the symptoms worsen or if the condition fails to improve as anticipated.   I provided  30 minutes of non-face-to-face time during this encounter reviewing patient's current problems and post surgeries.  Providing counseling on the above mentioned problems , and coordination  of care .  Crecencio Mc, MD

## 2019-02-20 NOTE — Assessment & Plan Note (Signed)
Treated previously with testosterone supplementation , which he has now stopped by choice .

## 2019-02-20 NOTE — Assessment & Plan Note (Signed)
He is overdue for testing,  Last A1c oct 2019, and has not been adherent to low GI diet until 2 weeks ago after a weight gain of 16 lbs    Lab Results  Component Value Date   HGBA1C 5.7 (H) 10/23/2017

## 2019-02-20 NOTE — Assessment & Plan Note (Addendum)
Not at goal on amlodipine and losartan.   Adding hctz

## 2019-02-20 NOTE — Assessment & Plan Note (Signed)
I have congratulated him in reduction of   BMI and encouraged  Continued weight loss with goal of 10% of body weight over the next 6 months using a low glycemic index diet and regular exercise a minimum of 5 days per week.

## 2019-02-20 NOTE — Assessment & Plan Note (Signed)
Ruling out UTI and prostatitis ,

## 2019-02-20 NOTE — Progress Notes (Signed)
Pt stated that his blood pressure has been running high around 160/80. Blood pressure this morning before taking his medication was 150/74 pulse 78.

## 2019-02-25 DIAGNOSIS — L570 Actinic keratosis: Secondary | ICD-10-CM | POA: Diagnosis not present

## 2019-02-25 DIAGNOSIS — D2262 Melanocytic nevi of left upper limb, including shoulder: Secondary | ICD-10-CM | POA: Diagnosis not present

## 2019-02-25 DIAGNOSIS — D2261 Melanocytic nevi of right upper limb, including shoulder: Secondary | ICD-10-CM | POA: Diagnosis not present

## 2019-02-25 DIAGNOSIS — Z85828 Personal history of other malignant neoplasm of skin: Secondary | ICD-10-CM | POA: Diagnosis not present

## 2019-02-25 DIAGNOSIS — L309 Dermatitis, unspecified: Secondary | ICD-10-CM | POA: Diagnosis not present

## 2019-02-27 ENCOUNTER — Other Ambulatory Visit (INDEPENDENT_AMBULATORY_CARE_PROVIDER_SITE_OTHER): Payer: BC Managed Care – PPO

## 2019-02-27 ENCOUNTER — Ambulatory Visit (INDEPENDENT_AMBULATORY_CARE_PROVIDER_SITE_OTHER): Payer: BC Managed Care – PPO

## 2019-02-27 ENCOUNTER — Other Ambulatory Visit: Payer: Self-pay

## 2019-02-27 VITALS — BP 118/74 | HR 81

## 2019-02-27 DIAGNOSIS — E1140 Type 2 diabetes mellitus with diabetic neuropathy, unspecified: Secondary | ICD-10-CM | POA: Diagnosis not present

## 2019-02-27 DIAGNOSIS — Z125 Encounter for screening for malignant neoplasm of prostate: Secondary | ICD-10-CM | POA: Diagnosis not present

## 2019-02-27 DIAGNOSIS — E782 Mixed hyperlipidemia: Secondary | ICD-10-CM

## 2019-02-27 DIAGNOSIS — R3911 Hesitancy of micturition: Secondary | ICD-10-CM

## 2019-02-27 DIAGNOSIS — N410 Acute prostatitis: Secondary | ICD-10-CM

## 2019-02-27 DIAGNOSIS — Z23 Encounter for immunization: Secondary | ICD-10-CM

## 2019-02-27 DIAGNOSIS — R7401 Elevation of levels of liver transaminase levels: Secondary | ICD-10-CM

## 2019-02-27 DIAGNOSIS — I10 Essential (primary) hypertension: Secondary | ICD-10-CM

## 2019-02-27 LAB — LIPID PANEL
Cholesterol: 169 mg/dL (ref 0–200)
HDL: 36.3 mg/dL — ABNORMAL LOW (ref >39.00)
LDL Cholesterol: 99 mg/dL (ref 0–99)
NonHDL: 132.71
Total CHOL/HDL Ratio: 5
Triglycerides: 170 mg/dL — ABNORMAL HIGH (ref 0.0–149.0)
VLDL: 34 mg/dL (ref 0.0–40.0)

## 2019-02-27 LAB — MICROALBUMIN / CREATININE URINE RATIO
Creatinine,U: 146.1 mg/dL
Microalb Creat Ratio: 1 mg/g (ref 0.0–30.0)
Microalb, Ur: 1.4 mg/dL (ref 0.0–1.9)

## 2019-02-27 LAB — COMPREHENSIVE METABOLIC PANEL
ALT: 56 U/L — ABNORMAL HIGH (ref 0–53)
AST: 31 U/L (ref 0–37)
Albumin: 4.7 g/dL (ref 3.5–5.2)
Alkaline Phosphatase: 51 U/L (ref 39–117)
BUN: 33 mg/dL — ABNORMAL HIGH (ref 6–23)
CO2: 27 mEq/L (ref 19–32)
Calcium: 9.9 mg/dL (ref 8.4–10.5)
Chloride: 102 mEq/L (ref 96–112)
Creatinine, Ser: 1.05 mg/dL (ref 0.40–1.50)
GFR: 71.77 mL/min (ref >60.00)
Glucose, Bld: 165 mg/dL — ABNORMAL HIGH (ref 70–99)
Potassium: 4.3 mEq/L (ref 3.5–5.1)
Sodium: 138 mEq/L (ref 135–145)
Total Bilirubin: 0.6 mg/dL (ref 0.2–1.2)
Total Protein: 7.2 g/dL (ref 6.0–8.3)

## 2019-02-27 LAB — URINALYSIS, ROUTINE W REFLEX MICROSCOPIC
Bilirubin Urine: NEGATIVE
Hgb urine dipstick: NEGATIVE
Leukocytes,Ua: NEGATIVE
Nitrite: NEGATIVE
RBC / HPF: NONE SEEN (ref 0–?)
Specific Gravity, Urine: 1.025 (ref 1.000–1.030)
Total Protein, Urine: NEGATIVE
Urine Glucose: NEGATIVE
Urobilinogen, UA: 0.2 (ref 0.0–1.0)
pH: 5.5 (ref 5.0–8.0)

## 2019-02-27 LAB — PSA: PSA: 5.87 ng/mL — ABNORMAL HIGH (ref 0.10–4.00)

## 2019-02-27 LAB — HEMOGLOBIN A1C: Hgb A1c MFr Bld: 6.1 % (ref 4.6–6.5)

## 2019-02-27 NOTE — Progress Notes (Signed)
Patient is here for a BP check due to bp being high at last visit, as per patient. PCP added a BP medication. Currently patients BP is 118/74 and BPM is 81.  Patient has no complaints of headaches, blurry vision, chest pain, arm pain, light headedness, dizziness, and nor jaw pain

## 2019-02-28 ENCOUNTER — Other Ambulatory Visit: Payer: BC Managed Care – PPO

## 2019-02-28 ENCOUNTER — Ambulatory Visit: Payer: BC Managed Care – PPO

## 2019-03-01 ENCOUNTER — Other Ambulatory Visit: Payer: Self-pay | Admitting: Internal Medicine

## 2019-03-01 DIAGNOSIS — N410 Acute prostatitis: Secondary | ICD-10-CM

## 2019-03-01 LAB — URINE CULTURE
MICRO NUMBER:: 10159524
SPECIMEN QUALITY:: ADEQUATE

## 2019-03-01 MED ORDER — AMOXICILLIN 500 MG PO CAPS: 500.0000 mg | ORAL_CAPSULE | Freq: Three times a day (TID) | ORAL | 0 refills | Status: AC

## 2019-03-01 NOTE — Addendum Note (Signed)
Addended by: Crecencio Mc on: 03/01/2019 04:26 PM   Modules accepted: Orders

## 2019-03-13 DIAGNOSIS — M1711 Unilateral primary osteoarthritis, right knee: Secondary | ICD-10-CM | POA: Diagnosis not present

## 2019-03-13 DIAGNOSIS — M25561 Pain in right knee: Secondary | ICD-10-CM | POA: Diagnosis not present

## 2019-03-13 DIAGNOSIS — M170 Bilateral primary osteoarthritis of knee: Secondary | ICD-10-CM | POA: Diagnosis not present

## 2019-03-14 DIAGNOSIS — M170 Bilateral primary osteoarthritis of knee: Secondary | ICD-10-CM | POA: Insufficient documentation

## 2019-03-20 MED ORDER — BLOOD GLUCOSE METER KIT: PACK | 0 refills | Status: DC

## 2019-04-01 DIAGNOSIS — Z20822 Contact with and (suspected) exposure to covid-19: Secondary | ICD-10-CM | POA: Diagnosis not present

## 2019-04-02 DIAGNOSIS — E139 Other specified diabetes mellitus without complications: Secondary | ICD-10-CM | POA: Insufficient documentation

## 2019-04-03 DIAGNOSIS — G8918 Other acute postprocedural pain: Secondary | ICD-10-CM | POA: Diagnosis not present

## 2019-04-03 DIAGNOSIS — Z7982 Long term (current) use of aspirin: Secondary | ICD-10-CM | POA: Diagnosis not present

## 2019-04-03 DIAGNOSIS — Z6841 Body Mass Index (BMI) 40.0 and over, adult: Secondary | ICD-10-CM | POA: Diagnosis not present

## 2019-04-03 DIAGNOSIS — E1340 Other specified diabetes mellitus with diabetic neuropathy, unspecified: Secondary | ICD-10-CM | POA: Diagnosis not present

## 2019-04-03 DIAGNOSIS — Z87891 Personal history of nicotine dependence: Secondary | ICD-10-CM | POA: Diagnosis not present

## 2019-04-03 DIAGNOSIS — M48061 Spinal stenosis, lumbar region without neurogenic claudication: Secondary | ICD-10-CM | POA: Diagnosis not present

## 2019-04-03 DIAGNOSIS — E559 Vitamin D deficiency, unspecified: Secondary | ICD-10-CM | POA: Diagnosis not present

## 2019-04-03 DIAGNOSIS — D649 Anemia, unspecified: Secondary | ICD-10-CM | POA: Diagnosis not present

## 2019-04-03 DIAGNOSIS — M170 Bilateral primary osteoarthritis of knee: Secondary | ICD-10-CM | POA: Diagnosis not present

## 2019-04-03 DIAGNOSIS — R972 Elevated prostate specific antigen [PSA]: Secondary | ICD-10-CM | POA: Diagnosis not present

## 2019-04-03 DIAGNOSIS — Z7984 Long term (current) use of oral hypoglycemic drugs: Secondary | ICD-10-CM | POA: Diagnosis not present

## 2019-04-03 DIAGNOSIS — E669 Obesity, unspecified: Secondary | ICD-10-CM | POA: Diagnosis not present

## 2019-04-03 DIAGNOSIS — Z8616 Personal history of COVID-19: Secondary | ICD-10-CM | POA: Diagnosis not present

## 2019-04-03 DIAGNOSIS — M1711 Unilateral primary osteoarthritis, right knee: Secondary | ICD-10-CM | POA: Diagnosis not present

## 2019-04-03 DIAGNOSIS — G4733 Obstructive sleep apnea (adult) (pediatric): Secondary | ICD-10-CM | POA: Diagnosis not present

## 2019-04-03 DIAGNOSIS — I10 Essential (primary) hypertension: Secondary | ICD-10-CM | POA: Diagnosis not present

## 2019-04-03 DIAGNOSIS — R519 Headache, unspecified: Secondary | ICD-10-CM | POA: Diagnosis not present

## 2019-04-03 DIAGNOSIS — M069 Rheumatoid arthritis, unspecified: Secondary | ICD-10-CM | POA: Diagnosis not present

## 2019-04-04 MED ORDER — HYDROMORPHONE HCL 1 MG/ML IJ SOLN
0.50 | INTRAMUSCULAR | Status: DC
Start: ? — End: 2019-04-04

## 2019-04-04 MED ORDER — LOSARTAN POTASSIUM 50 MG PO TABS
100.00 | ORAL_TABLET | ORAL | Status: DC
Start: ? — End: 2019-04-04

## 2019-04-04 MED ORDER — ACETAMINOPHEN 325 MG PO TABS
975.00 | ORAL_TABLET | ORAL | Status: DC
Start: 2019-04-03 — End: 2019-04-04

## 2019-04-04 MED ORDER — LIDOCAINE HCL 1 % IJ SOLN
0.50 | INTRAMUSCULAR | Status: DC
Start: ? — End: 2019-04-04

## 2019-04-04 MED ORDER — BISACODYL 10 MG RE SUPP
10.00 | RECTAL | Status: DC
Start: ? — End: 2019-04-04

## 2019-04-04 MED ORDER — OXYCODONE HCL 5 MG PO TABS
5.00 | ORAL_TABLET | ORAL | Status: DC
Start: ? — End: 2019-04-04

## 2019-04-04 MED ORDER — GENERIC EXTERNAL MEDICATION
3.00 | Status: DC
Start: 2019-04-04 — End: 2019-04-04

## 2019-04-04 MED ORDER — LACTATED RINGERS IV SOLN
INTRAVENOUS | Status: DC
Start: ? — End: 2019-04-04

## 2019-04-04 MED ORDER — MELOXICAM 7.5 MG PO TABS
7.50 | ORAL_TABLET | ORAL | Status: DC
Start: ? — End: 2019-04-04

## 2019-04-04 MED ORDER — GENERIC EXTERNAL MEDICATION
Status: DC
Start: ? — End: 2019-04-04

## 2019-04-04 MED ORDER — POLYETHYLENE GLYCOL 3350 17 GM/SCOOP PO POWD
17.00 | ORAL | Status: DC
Start: ? — End: 2019-04-04

## 2019-04-04 MED ORDER — ASPIRIN 81 MG PO CHEW
81.00 | CHEWABLE_TABLET | ORAL | Status: DC
Start: ? — End: 2019-04-04

## 2019-04-04 MED ORDER — SENNOSIDES-DOCUSATE SODIUM 8.6-50 MG PO TABS
2.00 | ORAL_TABLET | ORAL | Status: DC
Start: 2019-04-04 — End: 2019-04-04

## 2019-04-04 MED ORDER — AMLODIPINE BESYLATE 5 MG PO TABS
10.00 | ORAL_TABLET | ORAL | Status: DC
Start: ? — End: 2019-04-04

## 2019-04-04 MED ORDER — HYDROCHLOROTHIAZIDE 25 MG PO TABS
25.00 | ORAL_TABLET | ORAL | Status: DC
Start: ? — End: 2019-04-04

## 2019-04-04 MED ORDER — GLIPIZIDE 10 MG PO TABS
10.00 | ORAL_TABLET | ORAL | Status: DC
Start: ? — End: 2019-04-04

## 2019-04-04 MED ORDER — METFORMIN HCL 500 MG PO TABS
1000.00 | ORAL_TABLET | ORAL | Status: DC
Start: ? — End: 2019-04-04

## 2019-04-16 ENCOUNTER — Ambulatory Visit: Payer: BC Managed Care – PPO | Admitting: Nurse Practitioner

## 2019-04-16 ENCOUNTER — Encounter: Payer: Self-pay | Admitting: Nurse Practitioner

## 2019-04-16 ENCOUNTER — Other Ambulatory Visit: Payer: Self-pay

## 2019-04-16 VITALS — BP 138/68 | HR 90 | Temp 96.7°F | Ht 74.0 in | Wt 280.4 lb

## 2019-04-16 DIAGNOSIS — R300 Dysuria: Secondary | ICD-10-CM

## 2019-04-16 DIAGNOSIS — R350 Frequency of micturition: Secondary | ICD-10-CM

## 2019-04-16 LAB — POCT URINALYSIS DIPSTICK
Bilirubin, UA: NEGATIVE
Blood, UA: NEGATIVE
Glucose, UA: NEGATIVE
Leukocytes, UA: NEGATIVE
Nitrite, UA: POSITIVE
Protein, UA: NEGATIVE
Spec Grav, UA: 1.02 (ref 1.010–1.025)
Urobilinogen, UA: 0.2 E.U./dL
pH, UA: 5.5 (ref 5.0–8.0)

## 2019-04-16 LAB — COMPREHENSIVE METABOLIC PANEL
ALT: 26 U/L (ref 0–53)
AST: 18 U/L (ref 0–37)
Albumin: 4.8 g/dL (ref 3.5–5.2)
Alkaline Phosphatase: 57 U/L (ref 39–117)
BUN: 29 mg/dL — ABNORMAL HIGH (ref 6–23)
CO2: 31 mEq/L (ref 19–32)
Calcium: 10.2 mg/dL (ref 8.4–10.5)
Chloride: 99 mEq/L (ref 96–112)
Creatinine, Ser: 1.09 mg/dL (ref 0.40–1.50)
GFR: 68.71 mL/min (ref >60.00)
Glucose, Bld: 119 mg/dL — ABNORMAL HIGH (ref 70–99)
Potassium: 4 mEq/L (ref 3.5–5.1)
Sodium: 140 mEq/L (ref 135–145)
Total Bilirubin: 0.8 mg/dL (ref 0.2–1.2)
Total Protein: 7.4 g/dL (ref 6.0–8.3)

## 2019-04-16 LAB — PSA: PSA: 1.76 ng/mL (ref 0.10–4.00)

## 2019-04-16 NOTE — Progress Notes (Signed)
Established Patient Office Visit  Subjective:  Patient ID: David Pineda., male    DOB: 1955-05-22  Age: 64 y.o. MRN: [id number]  CC:  Chief Complaint  Patient presents with  . Urinary Tract Infection    urine frequency    HPI David Pineda. presents for dysuria:  burns with urinates, frequency every 20 min and at night every 2 hours onset 1 week. Passing good stream and not dribbling.No hematuria. No bladder pain or fullness. No flank pain, fever or chills. No nausea/vomiting. He is UTD on his  colonoscopy and is due now for follow -up.  Normal  BM without  constipation, rectal pain or pressure.   Recent Enterococcus faecalis UTI treated with 14 day course of Amoxicillin 03/01/2019.   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 He reports the knee is healing well but he cannot drive.   Past Medical History:  Diagnosis Date  . Arthritis   . COVID-19 virus infection 10/29/2018  . Diabetes mellitus without complication ([place])   . Elevated blood pressure reading   . Hyperlipidemia   . Hypertension   . Hypotestosteronism   . Obesity, morbid, BMI 40.0-49.9 ([place])   . OSA on CPAP     Past Surgical History:  Procedure Laterality Date  . FOOT SURGERY     left     Family History  Adopted: Yes  Problem Relation Age of Onset  . Diabetes Mother   . Hyperlipidemia Father   . Hypertension Father     Social History   Socioeconomic History  . Marital status: Divorced    Spouse name: Not on file  . Number of children: 1  . Years of education: 76  . Highest education level: Not on file  Occupational History  . Occupation: Marine scientist: [employer]    Comment: self   Tobacco Use  . Smoking status: Former Smoker    Packs/day: 3.00    Years: 7.00    Pack years: 21.00    Types: Cigarettes    Quit date: 11/17/1994    Years since quitting: 24.4  . Smokeless tobacco: Former Network engineer and Sexual Activity  . Alcohol use: Yes   Alcohol/week: 14.0 standard drinks    Types: 14 Cans of beer per week  . Drug use: No  . Sexual activity: Not Currently    Partners: Female  Other Topics Concern  . Not on file  Social History Narrative  . Not on file   Social Determinants of Health   Financial Resource Strain:   . Difficulty of Paying Living Expenses:   Food Insecurity:   . Worried About Charity fundraiser in the Last Year:   . Arboriculturist in the Last Year:   Transportation Needs: No Transportation Needs  . Lack of Transportation (Medical): No  . Lack of Transportation (Non-Medical): No  Physical Activity:   . Days of Exercise per Week:   . Minutes of Exercise per Session:   Stress:   . Feeling of Stress :   Social Connections:   . Frequency of Communication with Friends and Family:   . Frequency of Social Gatherings with Friends and Family:   . Attends Religious Services:   . Active Member of Clubs or Organizations:   . Attends Archivist Meetings:   Marland Kitchen Marital Status:   Intimate Partner Violence:   . Fear of Current or Ex-Partner:   . Emotionally Abused:   .  Physically Abused:   . Sexually Abused:     Outpatient Medications Prior to Visit  Medication Sig Dispense Refill  . amLODipine (NORVASC) 10 MG tablet TAKE 1 TABLET(10 MG) BY MOUTH DAILY 90 tablet 1  . Ascorbic Acid (VITAMIN C) 100 MG tablet Take 100 mg by mouth daily.    Marland Kitchen aspirin 325 MG tablet Take 325 mg by mouth daily.    . Azelastine-Fluticasone (DYMISTA) 137-50 MCG/ACT SUSP Place 2 Squirts into the nose daily. In each nostril 23 g 11  . blood glucose meter kit and supplies Use to check blood sugars twice daily. (FOR ICD-10 E10.9, E11.9). 1 each 0  . ergocalciferol (DRISDOL) 50000 units capsule Take 1 capsule (50,000 Units total) by mouth once a week. 4 capsule 2  . fluticasone (FLONASE) 50 MCG/ACT nasal spray Place 2 sprays into both nostrils daily. 16 g 1  . furosemide (LASIX) 20 MG tablet Take 1 tablet (20 mg total) by  mouth daily. As needed for fluid retention 30 tablet 3  . glipiZIDE (GLUCOTROL) 5 MG tablet Take 1 tablet (5 mg total) by mouth 2 (two) times daily before a meal. 180 tablet 1  . glucosamine-chondroitin 500-400 MG tablet Take 1 tablet by mouth 3 (three) times daily.    Marland Kitchen guaiFENesin (MUCINEX) 600 MG 12 hr tablet Take 1 tablet (600 mg total) by mouth 2 (two) times daily. 20 tablet 1  . hydrochlorothiazide (HYDRODIURIL) 25 MG tablet Take 1 tablet (25 mg total) by mouth daily. 90 tablet 3  . INS SYRINGE/NEEDLE 1CC/28G (B-D INSULIN SYRINGE 1CC/28G) 28G X 1/2" 1 ML MISC 1 Syringe by Does not apply route daily after supper. 100 each 0  . loratadine (CLARITIN) 10 MG tablet Take 1 tablet (10 mg total) by mouth daily. For nasal congestion 30 tablet 2  . losartan (COZAAR) 100 MG tablet TAKE 1 TABLET(100 MG) BY MOUTH DAILY 90 tablet 3  . meloxicam (MOBIC) 15 MG tablet TAKE 1 TABLET(15 MG) BY MOUTH DAILY 90 tablet 1  . metFORMIN (GLUCOPHAGE) 1000 MG tablet TAKE 1 TABLET(1000 MG) BY MOUTH TWICE DAILY WITH A MEAL 180 tablet 1  . mometasone (ELOCON) 0.1 % ointment Apply topically daily. To ear canals 45 g 3  . Potassium 99 MG TABS Take 1 tablet by mouth daily.    . pravastatin (PRAVACHOL) 40 MG tablet Take 1 tablet (40 mg total) by mouth daily. 90 tablet 1  . sildenafil (VIAGRA) 100 MG tablet Take 1 tablet (100 mg total) by mouth daily as needed for erectile dysfunction. 10 tablet 3  . Syringe/Needle, Disp, (SYRINGE 3CC/20GX1") 20G X 1" 3 ML MISC 1 Syringe by Does not apply route once a week. 24 each 1  . terbinafine (LAMISIL) 250 MG tablet Take 1 tablet (250 mg total) by mouth daily. 30 tablet 3  . testosterone cypionate (DEPOTESTOSTERONE CYPIONATE) 200 MG/ML injection INJECT 1 ML( 200 MG TOTAL) IN THE MUSCLE EVERY 14 DAYS 6 mL 1  . traMADol (ULTRAM) 50 MG tablet Take 1 tablet (50 mg total) by mouth every 6 (six) hours as needed. for pain 120 tablet 0   No facility-administered medications prior to visit.     No Known Allergies  Review of Systems  Constitutional: Negative for chills, fatigue and fever.  HENT: Negative for congestion and sore throat.   Respiratory: Negative for cough and chest tightness.   Cardiovascular: Negative for chest pain and palpitations.  Gastrointestinal: Negative for abdominal pain, anal bleeding, blood in stool, constipation, diarrhea, nausea, rectal  pain and vomiting.  Genitourinary: Positive for dysuria, frequency and urgency. Negative for decreased urine volume, difficulty urinating, discharge, flank pain, hematuria, penile pain, scrotal swelling and testicular pain.  Musculoskeletal: Negative for back pain and myalgias.  Skin: Negative for rash.      Objective:    Physical Exam  Constitutional: He is oriented to person, place, and time. He appears well-developed and well-nourished.  HENT:  Head: Normocephalic and atraumatic.  Eyes: Pupils are equal, round, and reactive to light.  Cardiovascular: Normal rate, regular rhythm and normal heart sounds.  Pulmonary/Chest: Effort normal and breath sounds normal.  Abdominal: Soft. He exhibits no distension and no mass. There is no abdominal tenderness. There is no rebound and no guarding.  Musculoskeletal:     Cervical back: Normal range of motion and neck supple.     Comments: Right knee post op knee dressing intact.  Neurological: He is alert and oriented to person, place, and time.  Skin: Skin is warm and dry.  Psychiatric: He has a normal mood and affect. His behavior is normal.    There were no vitals taken for this visit. Wt Readings from Last 3 Encounters:  02/20/19 296 lb (134.3 kg)  10/29/18 289 lb (131.1 kg)  03/13/18 289 lb (131.1 kg)   Health Maintenance Due  Topic Date Due  . HIV Screening  Never done  . FOOT EXAM  09/23/2018  . OPHTHALMOLOGY EXAM  01/26/2019   There are no preventive care reminders to display for this patient.  Lab Results  Component Value Date   TSH 1.04  09/22/2017   Lab Results  Component Value Date   WBC 7.2 09/22/2017   HGB 14.2 09/22/2017   HCT 41.2 09/22/2017   MCV 86.4 09/22/2017   PLT 192 09/22/2017   Lab Results  Component Value Date   NA 138 02/27/2019   K 4.3 02/27/2019   CO2 27 02/27/2019   GLUCOSE 165 (H) 02/27/2019   BUN 33 (H) 02/27/2019   CREATININE 1.05 02/27/2019   BILITOT 0.6 02/27/2019   ALKPHOS 51 02/27/2019   AST 31 02/27/2019   ALT 56 (H) 02/27/2019   PROT 7.2 02/27/2019   ALBUMIN 4.7 02/27/2019   CALCIUM 9.9 02/27/2019   GFR 71.77 02/27/2019   Lab Results  Component Value Date   CHOL 169 02/27/2019   Lab Results  Component Value Date   HDL 36.30 (L) 02/27/2019   Lab Results  Component Value Date   LDLCALC 99 02/27/2019   Lab Results  Component Value Date   TRIG 170.0 (H) 02/27/2019   Lab Results  Component Value Date   CHOLHDL 5 02/27/2019   Lab Results  Component Value Date   HGBA1C 6.1 02/27/2019      Assessment & Plan:   Problem List Items Addressed This Visit      Other   Dysuria    Other Visit Diagnoses    Urine frequency    -  Primary   Relevant Orders   Urine Microscopic   Urine Culture   CBC With Differential   Comp Met (CMET)   PSA   Ambulatory referral to Urology   POCT Urinalysis Dipstick (Completed)     Treatment for Enterococcus faecalis UTI treated with 14 day course of  Keflex in February.  His urine dip today is positive for nitrite.  Urine microbiology and urine culture sent.  He needs a urology consult to evaluate for prostatitis. No systemic symptoms to suspect pyelonephritis.  Nitrofurantoin works  well for men with simple UTI however it does not penetrate the prostate in case there is subclinical prostatitis which is indeed suspected here.  The advice is to treat with a different antibiotic than the one he had in February.  This leaves [place] with the choice of Cipro.  Patient just had knee replacement done 12 days ago. Await UA results.  This visit  occurred during the SARS-CoV-2 public health emergency.  Safety protocols were in place, including screening questions prior to the visit, additional usage of staff PPE, and extensive cleaning of exam room while observing appropriate contact time as indicated for disinfecting solutions.    Denice Paradise, NP

## 2019-04-17 ENCOUNTER — Telehealth: Payer: Self-pay | Admitting: Nurse Practitioner

## 2019-04-17 DIAGNOSIS — M1711 Unilateral primary osteoarthritis, right knee: Secondary | ICD-10-CM | POA: Diagnosis not present

## 2019-04-17 LAB — CBC WITH DIFFERENTIAL
Basophils Absolute: 0 10*3/uL (ref 0.0–0.2)
Basos: 0 %
EOS (ABSOLUTE): 0.1 10*3/uL (ref 0.0–0.4)
Eos: 1 %
Hematocrit: 35.7 % — ABNORMAL LOW (ref 37.5–51.0)
Hemoglobin: 12 g/dL — ABNORMAL LOW (ref 13.0–17.7)
Immature Grans (Abs): 0.1 10*3/uL (ref 0.0–0.1)
Immature Granulocytes: 1 %
Lymphocytes Absolute: 1.9 10*3/uL (ref 0.7–3.1)
Lymphs: 16 %
MCH: 28.5 pg (ref 26.6–33.0)
MCHC: 33.6 g/dL (ref 31.5–35.7)
MCV: 85 fL (ref 79–97)
Monocytes Absolute: 1.3 10*3/uL — ABNORMAL HIGH (ref 0.1–0.9)
Monocytes: 11 %
Neutrophils Absolute: 8.6 10*3/uL — ABNORMAL HIGH (ref 1.4–7.0)
Neutrophils: 71 %
RBC: 4.21 x10E6/uL (ref 4.14–5.80)
RDW: 13.2 % (ref 11.6–15.4)
WBC: 11.9 10*3/uL — ABNORMAL HIGH (ref 3.4–10.8)

## 2019-04-17 LAB — URINALYSIS, MICROSCOPIC ONLY
RBC / HPF: NONE SEEN (ref 0–?)
WBC, UA: NONE SEEN (ref 0–?)

## 2019-04-17 MED ORDER — CIPROFLOXACIN HCL 500 MG PO TABS: 500.0000 mg | ORAL_TABLET | Freq: Two times a day (BID) | ORAL | 0 refills | Status: AC

## 2019-04-17 NOTE — Telephone Encounter (Signed)
[place]: Please call him with UA that looks like he could have infection. His WBC in CBC is also mildly elevated. He may have a component of prostatitis. Begin Cipro 500 mg BID for 2 weeks. We may have to change to a different antibiotic pending urine culture results. IMPORTANT: Caution about tendon injury- avoid aggressive exercise-which he just had knee replacement and will be working with PT. Tell his PT that he is on CIPRO.  Also, Cipro increases glipizide risk of Hypoglycemia and patient will need to monitor his sugars- does he have a glucometer? If not, I would hold his glipizide during this treatment course.   Add a probiotic like Flor a Stor  one daily to help prevent diarrhea.   Hydrate with water- avoid soda, tea, coffee.   See Urology as planned. If Urology appt is > 1 week-come back her for ov f/up in 1 week. Call with any worsening symptoms-esp fever/chills, N/V, urinary problems, diarrhea, tendon pain.

## 2019-04-18 LAB — URINE CULTURE
MICRO NUMBER:: 10332290
SPECIMEN QUALITY:: ADEQUATE

## 2019-04-19 ENCOUNTER — Telehealth: Payer: Self-pay | Admitting: Nurse Practitioner

## 2019-04-19 DIAGNOSIS — Z4789 Encounter for other orthopedic aftercare: Secondary | ICD-10-CM | POA: Diagnosis not present

## 2019-04-19 DIAGNOSIS — M6281 Muscle weakness (generalized): Secondary | ICD-10-CM | POA: Diagnosis not present

## 2019-04-19 DIAGNOSIS — G8929 Other chronic pain: Secondary | ICD-10-CM | POA: Diagnosis not present

## 2019-04-19 DIAGNOSIS — M25561 Pain in right knee: Secondary | ICD-10-CM | POA: Diagnosis not present

## 2019-04-19 MED ORDER — TAMSULOSIN HCL 0.4 MG PO CAPS: 0.4000 mg | ORAL_CAPSULE | Freq: Every day | ORAL | 0 refills | Status: DC

## 2019-04-19 NOTE — Telephone Encounter (Signed)
I spoke with the Urologist on call, Dr. Claudia Desanctis and she will contact   [facility] and try to get his appt moved up.   In the meantime, she advises FLOMAX . I ordered Flomax to help him void easier. Stay on the CIPRO. Check  him to check the price and let me know if too expensive.

## 2019-04-19 NOTE — Telephone Encounter (Signed)
Pt returned call to [employer]. I tried to reach someone in back but no answer. Please advise

## 2019-04-19 NOTE — Telephone Encounter (Signed)
I called to talk to him about his UTI. He has a Urology appt in 2 weeks. He is at his PT now and cannot talk, but we will connect when he is free.

## 2019-04-19 NOTE — Telephone Encounter (Signed)
Spoke with pt to let him know that Maudie Mercury, NP spoke with Dr. Claudia Desanctis and she is going to try to get his appt moved up at [facility] the pt that in the meantime he needs to try taking flomax to help him urinate easier. Also advised him that he needs to continue taking the Cipro. Pt gave a verbal understanding.

## 2019-04-19 NOTE — Telephone Encounter (Signed)
I spoke with the Urologist on call, and she will contact   David Pineda and try to get his appt moved up.  In the meantime, she advises FLOMAX . I ordered it and ask  him to check the price and let me know if too expensive.

## 2019-04-22 DIAGNOSIS — M25561 Pain in right knee: Secondary | ICD-10-CM | POA: Diagnosis not present

## 2019-04-22 DIAGNOSIS — M6281 Muscle weakness (generalized): Secondary | ICD-10-CM | POA: Diagnosis not present

## 2019-04-22 DIAGNOSIS — Z4789 Encounter for other orthopedic aftercare: Secondary | ICD-10-CM | POA: Diagnosis not present

## 2019-04-22 DIAGNOSIS — G8929 Other chronic pain: Secondary | ICD-10-CM | POA: Diagnosis not present

## 2019-04-29 DIAGNOSIS — Z4789 Encounter for other orthopedic aftercare: Secondary | ICD-10-CM | POA: Diagnosis not present

## 2019-04-29 DIAGNOSIS — M6281 Muscle weakness (generalized): Secondary | ICD-10-CM | POA: Diagnosis not present

## 2019-04-29 DIAGNOSIS — M25561 Pain in right knee: Secondary | ICD-10-CM | POA: Diagnosis not present

## 2019-04-29 DIAGNOSIS — G8929 Other chronic pain: Secondary | ICD-10-CM | POA: Diagnosis not present

## 2019-05-01 DIAGNOSIS — M6281 Muscle weakness (generalized): Secondary | ICD-10-CM | POA: Diagnosis not present

## 2019-05-01 DIAGNOSIS — Z4789 Encounter for other orthopedic aftercare: Secondary | ICD-10-CM | POA: Diagnosis not present

## 2019-05-01 DIAGNOSIS — M25561 Pain in right knee: Secondary | ICD-10-CM | POA: Diagnosis not present

## 2019-05-01 DIAGNOSIS — G8929 Other chronic pain: Secondary | ICD-10-CM | POA: Diagnosis not present

## 2019-05-02 ENCOUNTER — Other Ambulatory Visit: Payer: BC Managed Care – PPO

## 2019-05-06 ENCOUNTER — Ambulatory Visit (INDEPENDENT_AMBULATORY_CARE_PROVIDER_SITE_OTHER): Payer: BC Managed Care – PPO | Admitting: Urology

## 2019-05-06 ENCOUNTER — Other Ambulatory Visit: Payer: Self-pay

## 2019-05-06 VITALS — BP 147/82 | HR 105 | Ht 72.0 in

## 2019-05-06 DIAGNOSIS — N401 Enlarged prostate with lower urinary tract symptoms: Secondary | ICD-10-CM | POA: Diagnosis not present

## 2019-05-06 DIAGNOSIS — N390 Urinary tract infection, site not specified: Secondary | ICD-10-CM

## 2019-05-06 DIAGNOSIS — M25561 Pain in right knee: Secondary | ICD-10-CM | POA: Diagnosis not present

## 2019-05-06 DIAGNOSIS — Z4789 Encounter for other orthopedic aftercare: Secondary | ICD-10-CM | POA: Diagnosis not present

## 2019-05-06 DIAGNOSIS — R350 Frequency of micturition: Secondary | ICD-10-CM

## 2019-05-06 DIAGNOSIS — G8929 Other chronic pain: Secondary | ICD-10-CM | POA: Diagnosis not present

## 2019-05-06 DIAGNOSIS — M6281 Muscle weakness (generalized): Secondary | ICD-10-CM | POA: Diagnosis not present

## 2019-05-06 LAB — URINALYSIS, COMPLETE
Bilirubin, UA: NEGATIVE
Glucose, UA: NEGATIVE
Leukocytes,UA: NEGATIVE
Nitrite, UA: NEGATIVE
Protein,UA: NEGATIVE
RBC, UA: NEGATIVE
Specific Gravity, UA: 1.025 (ref 1.005–1.030)
Urobilinogen, Ur: 0.2 mg/dL (ref 0.2–1.0)
pH, UA: 5.5 (ref 5.0–7.5)

## 2019-05-06 LAB — BLADDER SCAN AMB NON-IMAGING

## 2019-05-06 LAB — MICROSCOPIC EXAMINATION
Bacteria, UA: NONE SEEN
RBC, Urine: NONE SEEN /hpf (ref 0–2)

## 2019-05-06 MED ORDER — TAMSULOSIN HCL 0.4 MG PO CAPS: 0.4000 mg | ORAL_CAPSULE | Freq: Every day | ORAL | 11 refills | Status: DC

## 2019-05-06 NOTE — Patient Instructions (Signed)
Urinary Tract Infection, Adult  A urinary tract infection (UTI) is an infection of any part of the urinary tract. The urinary tract includes the kidneys, ureters, bladder, and urethra. These organs make, store, and get rid of urine in the body. Your health care provider may use other names to describe the infection. An upper UTI affects the ureters and kidneys (pyelonephritis). A lower UTI affects the bladder (cystitis) and urethra (urethritis). What are the causes? Most urinary tract infections are caused by bacteria in your genital area, around the entrance to your urinary tract (urethra). These bacteria grow and cause inflammation of your urinary tract. What increases the risk? You are more likely to develop this condition if:  You have a urinary catheter that stays in place (indwelling).  You are not able to control when you urinate or have a bowel movement (you have incontinence).  You are male and you: ? Use a spermicide or diaphragm for birth control. ? Have low estrogen levels. ? Are pregnant.  You have certain genes that increase your risk (genetics).  You are sexually active.  You take antibiotic medicines.  You have a condition that causes your flow of urine to slow down, such as: ? An enlarged prostate, if you are male. ? Blockage in your urethra (stricture). ? A kidney stone. ? A nerve condition that affects your bladder control (neurogenic bladder). ? Not getting enough to drink, or not urinating often.  You have certain medical conditions, such as: ? Diabetes. ? A weak disease-fighting system (immunesystem). ? Sickle cell disease. ? Gout. ? Spinal cord injury. What are the signs or symptoms? Symptoms of this condition include:  Needing to urinate right away (urgently).  Frequent urination or passing small amounts of urine frequently.  Pain or burning with urination.  Blood in the urine.  Urine that smells bad or unusual.  Trouble urinating.  Cloudy  urine.  Vaginal discharge, if you are male.  Pain in the abdomen or the lower back. You may also have:  Vomiting or a decreased appetite.  Confusion.  Irritability or tiredness.  A fever.  Diarrhea. The first symptom in older adults may be confusion. In some cases, they may not have any symptoms until the infection has worsened. How is this diagnosed? This condition is diagnosed based on your medical history and a physical exam. You may also have other tests, including:  Urine tests.  Blood tests.  Tests for sexually transmitted infections (STIs). If you have had more than one UTI, a cystoscopy or imaging studies may be done to determine the cause of the infections. How is this treated? Treatment for this condition includes:  Antibiotic medicine.  Over-the-counter medicines to treat discomfort.  Drinking enough water to stay hydrated. If you have frequent infections or have other conditions such as a kidney stone, you may need to see a health care provider who specializes in the urinary tract (urologist). In rare cases, urinary tract infections can cause sepsis. Sepsis is a life-threatening condition that occurs when the body responds to an infection. Sepsis is treated in the hospital with IV antibiotics, fluids, and other medicines. Follow these instructions at home:  Medicines  Take over-the-counter and prescription medicines only as told by your health care provider.  If you were prescribed an antibiotic medicine, take it as told by your health care provider. Do not stop using the antibiotic even if you start to feel better. General instructions  Make sure you: ? Empty your bladder often and   completely. Do not hold urine for long periods of time. ? Empty your bladder after sex. ? Wipe from front to back after a bowel movement if you are male. Use each tissue one time when you wipe.  Drink enough fluid to keep your urine pale yellow.  Keep all follow-up  visits as told by your health care provider. This is important. Contact a health care provider if:  Your symptoms do not get better after 1-2 days.  Your symptoms go away and then return. Get help right away if you have:  Severe pain in your back or your lower abdomen.  A fever.  Nausea or vomiting. Summary  A urinary tract infection (UTI) is an infection of any part of the urinary tract, which includes the kidneys, ureters, bladder, and urethra.  Most urinary tract infections are caused by bacteria in your genital area, around the entrance to your urinary tract (urethra).  Treatment for this condition often includes antibiotic medicines.  If you were prescribed an antibiotic medicine, take it as told by your health care provider. Do not stop using the antibiotic even if you start to feel better.  Keep all follow-up visits as told by your health care provider. This is important. This information is not intended to replace advice given to you by your health care provider. Make sure you discuss any questions you have with your health care provider. Document Revised: 12/14/2017 Document Reviewed: 07/06/2017 Elsevier Patient Education  2020 [employer] Inc.  Benign Prostatic Hyperplasia  Benign prostatic hyperplasia (BPH) is an enlarged prostate gland that is caused by the normal aging process and not by cancer. The prostate is a walnut-sized gland that is involved in the production of semen. It is located in front of the rectum and below the bladder. The bladder stores urine and the urethra is the tube that carries the urine out of the body. The prostate may get bigger as a man gets older. An enlarged prostate can press on the urethra. This can make it harder to pass urine. The build-up of urine in the bladder can cause infection. Back pressure and infection may progress to bladder damage and kidney (renal) failure. What are the causes? This condition is part of a normal aging process.  However, not all men develop problems from this condition. If the prostate enlarges away from the urethra, urine flow will not be blocked. If it enlarges toward the urethra and compresses it, there will be problems passing urine. What increases the risk? This condition is more likely to develop in men over the age of 50 years. What are the signs or symptoms? Symptoms of this condition include:  Getting up often during the night to urinate.  Needing to urinate frequently during the day.  Difficulty starting urine flow.  Decrease in size and strength of your urine stream.  Leaking (dribbling) after urinating.  Inability to pass urine. This needs immediate treatment.  Inability to completely empty your bladder.  Pain when you pass urine. This is more common if there is also an infection.  Urinary tract infection (UTI). How is this diagnosed? This condition is diagnosed based on your medical history, a physical exam, and your symptoms. Tests will also be done, such as:  A post-void bladder scan. This measures any amount of urine that may remain in your bladder after you finish urinating.  A digital rectal exam. In a rectal exam, your health care provider checks your prostate by putting a lubricated, gloved finger into your   rectum to feel the back of your prostate gland. This exam detects the size of your gland and any abnormal lumps or growths.  An exam of your urine (urinalysis).  A prostate specific antigen (PSA) screening. This is a blood test used to screen for prostate cancer.  An ultrasound. This test uses sound waves to electronically produce a picture of your prostate gland. Your health care provider may refer you to a specialist in kidney and prostate diseases (urologist). How is this treated? Once symptoms begin, your health care provider will monitor your condition (active surveillance or watchful waiting). Treatment for this condition will depend on the severity of your  condition. Treatment may include:  Observation and yearly exams. This may be the only treatment needed if your condition and symptoms are mild.  Medicines to relieve your symptoms, including: ? Medicines to shrink the prostate. ? Medicines to relax the muscle of the prostate.  Surgery in severe cases. Surgery may include: ? Prostatectomy. In this procedure, the prostate tissue is removed completely through an open incision or with a laparoscope or robotics. ? Transurethral resection of the prostate (TURP). In this procedure, a tool is inserted through the opening at the tip of the penis (urethra). It is used to cut away tissue of the inner core of the prostate. The pieces are removed through the same opening of the penis. This removes the blockage. ? Transurethral incision (TUIP). In this procedure, small cuts are made in the prostate. This lessens the prostate's pressure on the urethra. ? Transurethral microwave thermotherapy (TUMT). This procedure uses microwaves to create heat. The heat destroys and removes a small amount of prostate tissue. ? Transurethral needle ablation (TUNA). This procedure uses radio frequencies to destroy and remove a small amount of prostate tissue. ? Interstitial laser coagulation (ILC). This procedure uses a laser to destroy and remove a small amount of prostate tissue. ? Transurethral electrovaporization (TUVP). This procedure uses electrodes to destroy and remove a small amount of prostate tissue. ? Prostatic urethral lift. This procedure inserts an implant to push the lobes of the prostate away from the urethra. Follow these instructions at home:  Take over-the-counter and prescription medicines only as told by your health care provider.  Monitor your symptoms for any changes. Contact your health care provider with any changes.  Avoid drinking large amounts of liquid before going to bed or out in public.  Avoid or reduce how much caffeine or alcohol you  drink.  Give yourself time when you urinate.  Keep all follow-up visits as told by your health care provider. This is important. Contact a health care provider if:  You have unexplained back pain.  Your symptoms do not get better with treatment.  You develop side effects from the medicine you are taking.  Your urine becomes very dark or has a bad smell.  Your lower abdomen becomes distended and you have trouble passing your urine. Get help right away if:  You have a fever or chills.  You suddenly cannot urinate.  You feel lightheaded, or very dizzy, or you faint.  There are large amounts of blood or clots in the urine.  Your urinary problems become hard to manage.  You develop moderate to severe low back or flank pain. The flank is the side of your body between the ribs and the hip. These symptoms may represent a serious problem that is an emergency. Do not wait to see if the symptoms will go away. Get medical help right   away. Call your local emergency services (911 in the [place] Do not drive yourself to the hospital. Summary  Benign prostatic hyperplasia (BPH) is an enlarged prostate that is caused by the normal aging process and not by cancer.  An enlarged prostate can press on the urethra. This can make it hard to pass urine.  This condition is part of a normal aging process and is more likely to develop in men over the age of 50 years.  Get help right away if you suddenly cannot urinate. This information is not intended to replace advice given to you by your health care provider. Make sure you discuss any questions you have with your health care provider. Document Revised: 11/21/2017 Document Reviewed: 02/01/2016 Elsevier Patient Education  2020 Elsevier Inc.  

## 2019-05-06 NOTE — Progress Notes (Signed)
05/06/19 10:15 AM   David Arnt Jr. September 07, 1955 [id number]  CC: BPH, urinary symptoms, UTI  HPI: I saw David Pineda in urology clinic today for the above issues.  He is a 64 year old male with morbid obesity, diabetes, hypertension, low testosterone on testosterone replacement by PCP, and sleep apnea on CPAP.  He presented to his PCP with worsening urinary frequency and dysuria in February 2021 and was found to have an Enterococcus UTI.  He was treated with culture appropriate amoxicillin x2 weeks.  He then underwent a knee replacement, and no catheter was placed.  He had recurrence of his urinary symptoms on 04/16/2019 and culture Gram grew Enterococcus.  He was treated with 2 weeks of Cipro which resolved his symptoms.  He was also started on Flomax which he feels improves his urinary symptoms.  He denies any incontinence or gross hematuria.  He is adopted so unknown family history.  He denies any prior episodes of urinary retention.  Urinalysis is benign today with 0-5 WBCs, 0 RBCs, no bacteria, nitrite negative.  PVR is normal at 60 mL.  No prior cross-sectional imaging to review.  PSA was mildly elevated at the time of acute infection, but dropped back down to normal at 1.76 after treatment.  PMH: Past Medical History:  Diagnosis Date  . Arthritis   . COVID-19 virus infection 10/29/2018  . Diabetes mellitus without complication ([place])   . Elevated blood pressure reading   . Hyperlipidemia   . Hypertension   . Hypotestosteronism   . Obesity, morbid, BMI 40.0-49.9 ([place])   . OSA on CPAP     Surgical History: Past Surgical History:  Procedure Laterality Date  . FOOT SURGERY     left     Family History: Family History  Adopted: Yes  Problem Relation Age of Onset  . Diabetes Mother   . Hyperlipidemia Father   . Hypertension Father     Social History:  reports that he quit smoking about 24 years ago. His smoking use included cigarettes. He has a 21.00 pack-year smoking  history. He has quit using smokeless tobacco. He reports current alcohol use of about 14.0 standard drinks of alcohol per week. He reports that he does not use drugs.  Physical Exam: BP (!) 147/82 (BP Location: Left Arm, Patient Position: Sitting, Cuff Size: Large)   Pulse (!) 105   Ht 6' (1.829 m)   BMI 38.03 kg/m    Constitutional: Alert and oriented, obese Cardiovascular: No clubbing, cyanosis, or edema. Respiratory: Normal respiratory effort, no increased work of breathing. 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  He denies any current urinary symptoms after 2 weeks of Cipro and starting Flomax.  We had a long conversation about possible etiologies of UTIs including incomplete emptying, urethral stricture disease, BPH, or bladder stones.  He is still recovering from a recent knee replacement.  We discussed options ranging from least invasive of continuing Flomax and adding cranberry tablets to help prevent UTIs with timed voiding, to more invasive including cystoscopy to evaluate for urethral stricture and prostate anatomy and possible transrectal ultrasound of the  prostate and consideration of an outlet procedure.  He would like to hold off on any further testing or imaging at this time which is not unreasonable.  We discussed return precautions at length.  Continue Flomax, cranberry tablets for UTI prophylaxis RTC 3 months with PVR, sooner if recurrent UTIs or worsening urinary symptoms If issues sooner, recommend cystoscopy and ultrasound  Nickolas Madrid,  MD 05/06/2019  [facility] [address] [place], [place] [address] [phone]

## 2019-05-07 DIAGNOSIS — Z20828 Contact with and (suspected) exposure to other viral communicable diseases: Secondary | ICD-10-CM | POA: Diagnosis not present

## 2019-05-07 DIAGNOSIS — Z20822 Contact with and (suspected) exposure to covid-19: Secondary | ICD-10-CM | POA: Diagnosis not present

## 2019-05-09 ENCOUNTER — Other Ambulatory Visit: Payer: BC Managed Care – PPO

## 2019-05-13 DIAGNOSIS — M6281 Muscle weakness (generalized): Secondary | ICD-10-CM | POA: Diagnosis not present

## 2019-05-13 DIAGNOSIS — G8929 Other chronic pain: Secondary | ICD-10-CM | POA: Diagnosis not present

## 2019-05-13 DIAGNOSIS — Z4789 Encounter for other orthopedic aftercare: Secondary | ICD-10-CM | POA: Diagnosis not present

## 2019-05-13 DIAGNOSIS — M25561 Pain in right knee: Secondary | ICD-10-CM | POA: Diagnosis not present

## 2019-05-15 DIAGNOSIS — M25561 Pain in right knee: Secondary | ICD-10-CM | POA: Diagnosis not present

## 2019-05-15 DIAGNOSIS — G8929 Other chronic pain: Secondary | ICD-10-CM | POA: Diagnosis not present

## 2019-05-15 DIAGNOSIS — Z4789 Encounter for other orthopedic aftercare: Secondary | ICD-10-CM | POA: Diagnosis not present

## 2019-05-15 DIAGNOSIS — M6281 Muscle weakness (generalized): Secondary | ICD-10-CM | POA: Diagnosis not present

## 2019-05-20 DIAGNOSIS — Z96651 Presence of right artificial knee joint: Secondary | ICD-10-CM | POA: Diagnosis not present

## 2019-05-20 DIAGNOSIS — M25561 Pain in right knee: Secondary | ICD-10-CM | POA: Diagnosis not present

## 2019-05-20 DIAGNOSIS — G4733 Obstructive sleep apnea (adult) (pediatric): Secondary | ICD-10-CM | POA: Diagnosis not present

## 2019-06-03 DIAGNOSIS — G8929 Other chronic pain: Secondary | ICD-10-CM | POA: Diagnosis not present

## 2019-06-03 DIAGNOSIS — Z4789 Encounter for other orthopedic aftercare: Secondary | ICD-10-CM | POA: Diagnosis not present

## 2019-06-03 DIAGNOSIS — M25561 Pain in right knee: Secondary | ICD-10-CM | POA: Diagnosis not present

## 2019-06-03 DIAGNOSIS — M6281 Muscle weakness (generalized): Secondary | ICD-10-CM | POA: Diagnosis not present

## 2019-06-05 DIAGNOSIS — E119 Type 2 diabetes mellitus without complications: Secondary | ICD-10-CM | POA: Diagnosis not present

## 2019-06-05 DIAGNOSIS — H40012 Open angle with borderline findings, low risk, left eye: Secondary | ICD-10-CM | POA: Diagnosis not present

## 2019-06-05 DIAGNOSIS — Z7984 Long term (current) use of oral hypoglycemic drugs: Secondary | ICD-10-CM | POA: Diagnosis not present

## 2019-06-05 DIAGNOSIS — H2513 Age-related nuclear cataract, bilateral: Secondary | ICD-10-CM | POA: Diagnosis not present

## 2019-06-11 ENCOUNTER — Other Ambulatory Visit: Payer: Self-pay

## 2019-06-11 MED ORDER — AMLODIPINE BESYLATE 10 MG PO TABS: ORAL_TABLET | ORAL | 1 refills | Status: DC

## 2019-06-12 LAB — HM DIABETES EYE EXAM

## 2019-07-22 DIAGNOSIS — I10 Essential (primary) hypertension: Secondary | ICD-10-CM | POA: Diagnosis not present

## 2019-07-22 DIAGNOSIS — Z01818 Encounter for other preprocedural examination: Secondary | ICD-10-CM | POA: Diagnosis not present

## 2019-07-22 DIAGNOSIS — Z79899 Other long term (current) drug therapy: Secondary | ICD-10-CM | POA: Diagnosis not present

## 2019-07-22 DIAGNOSIS — M170 Bilateral primary osteoarthritis of knee: Secondary | ICD-10-CM | POA: Diagnosis not present

## 2019-07-22 DIAGNOSIS — G4733 Obstructive sleep apnea (adult) (pediatric): Secondary | ICD-10-CM | POA: Diagnosis not present

## 2019-07-22 DIAGNOSIS — E1140 Type 2 diabetes mellitus with diabetic neuropathy, unspecified: Secondary | ICD-10-CM | POA: Diagnosis not present

## 2019-07-23 LAB — BASIC METABOLIC PANEL
BUN: 28 — AB (ref 4–21)
Creatinine: 0.9 (ref 0.6–1.3)
Glucose: 142
Potassium: 4.1 (ref 3.4–5.3)
Sodium: 140 (ref 137–147)

## 2019-07-23 LAB — CBC AND DIFFERENTIAL
HCT: 41 (ref 41–53)
Hemoglobin: 13.5 (ref 13.5–17.5)
Platelets: 184 (ref 150–399)
WBC: 6.3

## 2019-07-23 LAB — HEMOGLOBIN A1C: Hemoglobin A1C: 6.5

## 2019-07-31 ENCOUNTER — Ambulatory Visit: Payer: BC Managed Care – PPO | Admitting: Internal Medicine

## 2019-08-05 ENCOUNTER — Other Ambulatory Visit: Payer: Self-pay | Admitting: Internal Medicine

## 2019-08-06 ENCOUNTER — Encounter: Payer: Self-pay | Admitting: Internal Medicine

## 2019-08-06 ENCOUNTER — Telehealth: Payer: Self-pay | Admitting: Internal Medicine

## 2019-08-06 ENCOUNTER — Other Ambulatory Visit: Payer: Self-pay

## 2019-08-06 ENCOUNTER — Ambulatory Visit (INDEPENDENT_AMBULATORY_CARE_PROVIDER_SITE_OTHER): Payer: BC Managed Care – PPO | Admitting: Internal Medicine

## 2019-08-06 DIAGNOSIS — E782 Mixed hyperlipidemia: Secondary | ICD-10-CM | POA: Diagnosis not present

## 2019-08-06 DIAGNOSIS — E349 Endocrine disorder, unspecified: Secondary | ICD-10-CM | POA: Diagnosis not present

## 2019-08-06 DIAGNOSIS — E1140 Type 2 diabetes mellitus with diabetic neuropathy, unspecified: Secondary | ICD-10-CM

## 2019-08-06 DIAGNOSIS — I10 Essential (primary) hypertension: Secondary | ICD-10-CM | POA: Diagnosis not present

## 2019-08-06 MED ORDER — GLIPIZIDE 5 MG PO TABS: 5.0000 mg | ORAL_TABLET | Freq: Two times a day (BID) | ORAL | 1 refills | Status: DC

## 2019-08-06 MED ORDER — METOPROLOL TARTRATE 25 MG PO TABS
25.0000 mg | ORAL_TABLET | Freq: Two times a day (BID) | ORAL | 3 refills | Status: DC
Start: 2019-08-06 — End: 2020-08-10

## 2019-08-06 NOTE — Assessment & Plan Note (Signed)
He has stopped testosterone replacement due to lack of need and cost

## 2019-08-06 NOTE — Assessment & Plan Note (Signed)
Not taking pravastatin .  Lipids are normal  Lab Results  Component Value Date   CHOL 169 02/27/2019   HDL 36.30 (L) 02/27/2019   LDLCALC 99 02/27/2019   LDLDIRECT 114 (H) 09/22/2017   TRIG 170.0 (H) 02/27/2019   CHOLHDL 5 02/27/2019

## 2019-08-06 NOTE — Assessment & Plan Note (Signed)
Currently well-controlled on current medications .  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 Patient is reminded to schedule an annual eye exam and foot exam is normal today. Patient has no microalbuminuria. Patient is tolerating statin therapy for CAD risk reduction and on ACE/ARB for renal protection and hypertension   Lab Results  Component Value Date   HGBA1C 6.1 02/27/2019

## 2019-08-06 NOTE — Assessment & Plan Note (Signed)
I have congratulated him in reduction of   BMI and encouraged  Continued weight loss with goal of 10% of body weigh over the next 6 months using a low glycemic index diet and regular exercise a minimum of 5 days per week.   

## 2019-08-06 NOTE — Telephone Encounter (Signed)
Pt called stated that he picked up his medications from pharmacy and this one was not thereglipiZIDE (GLUCOTROL) 5 MG tablet need this one refill also

## 2019-08-06 NOTE — Assessment & Plan Note (Addendum)
Not at goal.  Adding metoprolol 25 mg bid to current regimen of amlodipine 10 mg , hctz 25 mg and losartan 100 mg .  Recent lytes and Cr were normal by [facility] labs done in early July

## 2019-08-06 NOTE — Patient Instructions (Signed)
I am adding metoprolol 25 mg twice daily for your blood pressure.  This is also protective against perioperative cardiac events (heart attacks  Atrial fib,  Etc)

## 2019-08-06 NOTE — Progress Notes (Signed)
Subjective:  Patient ID: David Pineda., male    DOB: 07/20/1955  Age: 64 y.o. MRN: [id number]  CC: Diagnoses of Mixed hyperlipidemia, Obesity, morbid, BMI 40.0-49.9 ([place]), Hypotestosteronism, Essential hypertension, and Type 2 diabetes mellitus with diabetic neuropathy, without long-term current use of insulin ([place]) were pertinent to this visit.   HPI David Pineda. presents for FOLLOW UP ON TYPE 2 DM. Hypertension and obesity.   Since his last visit he has had a right knee replacement,  And recently had labs done during a  preoperative evaluation  At [facility]  For left knee replacement;  Reviewed labs including a1c of 6.4 hgb 13.5 normal cr. He requested a review of recent and prior labs due to concerns about several being abnormal.   The last 6 months of labs were reviewed with patient.  1) Nocturia improved with flomax.  One void per night . Has seen urologist .  Still feels he has  incomplete voiding  When standing to void,  But not when sitting (takes his time).  PVR was "normal " at 60 ml per [place]   2) DJD:  Right knee was replacement was done in March and he is happy with results.Marland Kitchen still feels tight at times,  Mostly in the evening .      3) has abstained from alcohol for one month  4) Type 2 DM:  He feels generally well, is walking  several times per week and checking blood sugars once daily at variable times.  BS have been under 130 fasting and < 150 post prandially.  Denies any recent hypoglyemic events.  Taking his medications as directed. Following a carbohydrate modified diet 6 days per week. Denies numbness, burning and tingling of extremities. Appetite is good.   5) Hypertension: patient checks blood pressure twice weekly at home.  Readings have been for the most part > 140/80 at rest . Patient is following a reduce salt diet most days and is taking medications as prescribed    Outpatient Medications Prior to Visit  Medication Sig Dispense Refill  . amLODipine  (NORVASC) 10 MG tablet TAKE 1 TABLET(10 MG) BY MOUTH DAILY 90 tablet 1  . Ascorbic Acid (VITAMIN C) 100 MG tablet Take 100 mg by mouth daily.    Marland Kitchen aspirin 325 MG tablet Take 325 mg by mouth daily.    . blood glucose meter kit and supplies Use to check blood sugars twice daily. (FOR ICD-10 E10.9, E11.9). 1 each 0  . Blood Glucose Monitoring Suppl (FIFTY50 GLUCOSE METER 2.0) w/Device KIT 2 (two) times daily And supplies    . Cholecalciferol 50 MCG (2000 UT) CAPS Take by mouth.    Marland Kitchen econazole nitrate 1 % cream APPLY EXTERNALLY TO RASH TWICE DAILY UNTIL IMPROVED    . fluticasone (FLONASE) 50 MCG/ACT nasal spray Place 2 sprays into both nostrils daily. 16 g 1  . glucosamine-chondroitin 500-400 MG tablet Take 1 tablet by mouth 3 (three) times daily.    . hydrochlorothiazide (HYDRODIURIL) 25 MG tablet Take 1 tablet (25 mg total) by mouth daily. 90 tablet 3  . INS SYRINGE/NEEDLE 1CC/28G (B-D INSULIN SYRINGE 1CC/28G) 28G X 1/2" 1 ML MISC 1 Syringe by Does not apply route daily after supper. 100 each 0  . losartan (COZAAR) 100 MG tablet TAKE 1 TABLET(100 MG) BY MOUTH DAILY 90 tablet 3  . meloxicam (MOBIC) 15 MG tablet TAKE 1 TABLET(15 MG) BY MOUTH DAILY 90 tablet 1  . metFORMIN (GLUCOPHAGE) 1000 MG tablet TAKE 1  TABLET(1000 MG) BY MOUTH TWICE DAILY WITH A MEAL 180 tablet 1  . Misc Natural Products (OSTEO BI-FLEX ADV TRIPLE ST) TABS Take by mouth.    Marland Kitchen STIMULANT LAXATIVE 8.6-50 MG tablet TAKE 2 TABLETS BY MOUTH TWICE DAILY FOR 8 DAYS    . Syringe/Needle, Disp, (SYRINGE 3CC/20GX1") 20G X 1" 3 ML MISC 1 Syringe by Does not apply route once a week. 24 each 1  . tamsulosin (FLOMAX) 0.4 MG CAPS capsule Take 1 capsule (0.4 mg total) by mouth daily. 30 capsule 11  . traMADol (ULTRAM) 50 MG tablet Take 1 tablet (50 mg total) by mouth every 6 (six) hours as needed. for pain 120 tablet 0  . Azelastine-Fluticasone (DYMISTA) 137-50 MCG/ACT SUSP Place 2 Squirts into the nose daily. In each nostril 23 g 11  .  ergocalciferol (DRISDOL) 50000 units capsule Take 1 capsule (50,000 Units total) by mouth once a week. 4 capsule 2  . furosemide (LASIX) 20 MG tablet Take 1 tablet (20 mg total) by mouth daily. As needed for fluid retention 30 tablet 3  . glipiZIDE (GLUCOTROL) 5 MG tablet Take 1 tablet (5 mg total) by mouth 2 (two) times daily before a meal. 180 tablet 1  . guaiFENesin (MUCINEX) 600 MG 12 hr tablet Take 1 tablet (600 mg total) by mouth 2 (two) times daily. 20 tablet 1  . loratadine (CLARITIN) 10 MG tablet Take 1 tablet (10 mg total) by mouth daily. For nasal congestion 30 tablet 2  . oxyCODONE (OXY IR/ROXICODONE) 5 MG immediate release tablet Take 5-10 mg by mouth every 4 (four) hours as needed.    . Potassium 99 MG TABS Take 1 tablet by mouth daily.    . pravastatin (PRAVACHOL) 40 MG tablet Take 1 tablet (40 mg total) by mouth daily. 90 tablet 1  . sildenafil (VIAGRA) 100 MG tablet Take 1 tablet (100 mg total) by mouth daily as needed for erectile dysfunction. 10 tablet 3  . terbinafine (LAMISIL) 250 MG tablet Take 1 tablet (250 mg total) by mouth daily. 30 tablet 3  . testosterone cypionate (DEPOTESTOSTERONE CYPIONATE) 200 MG/ML injection INJECT 1 ML( 200 MG TOTAL) IN THE MUSCLE EVERY 14 DAYS 6 mL 1   No facility-administered medications prior to visit.    Review of Systems;  Patient denies headache, fevers, malaise, unintentional weight loss, skin rash, eye pain, sinus congestion and sinus pain, sore throat, dysphagia,  hemoptysis , cough, dyspnea, wheezing, chest pain, palpitations, orthopnea, edema, abdominal pain, nausea, melena, diarrhea, constipation, flank pain, dysuria, hematuria, urinary  Frequency, nocturia, numbness, tingling, seizures,  Focal weakness, Loss of consciousness,  Tremor, insomnia, depression, anxiety, and suicidal ideation.      Objective:  BP (!) 138/82 (BP Location: Left Arm, Patient Position: Sitting, Cuff Size: Large)   Pulse 79   Temp 98.1 F (36.7 C) (Oral)    Resp 16   Ht 6' (1.829 m)   Wt (!) 298 lb 3.2 oz (135.3 kg)   SpO2 99%   BMI 40.44 kg/m   BP Readings from Last 3 Encounters:  08/06/19 (!) 138/82  05/06/19 (!) 147/82  04/16/19 138/68    Wt Readings from Last 3 Encounters:  08/06/19 (!) 298 lb 3.2 oz (135.3 kg)  04/16/19 280 lb 6.4 oz (127.2 kg)  02/20/19 296 lb (134.3 kg)    General appearance: alert, cooperative and appears stated age Ears: normal TM's and external ear canals both ears Throat: lips, mucosa, and tongue normal; teeth and gums normal Neck: no  adenopathy, no carotid bruit, supple, symmetrical, trachea midline and thyroid not enlarged, symmetric, no tenderness/mass/nodules Back: symmetric, no curvature. ROM normal. No CVA tenderness. Lungs: clear to auscultation bilaterally Heart: regular rate and rhythm, S1, S2 normal, no murmur, click, rub or gallop Abdomen: soft, non-tender; bowel sounds normal; no masses,  no organomegaly Pulses: 2+ and symmetric Skin: Skin color, texture, turgor normal. No rashes or lesions Lymph nodes: Cervical, supraclavicular, and axillary nodes normal.  Lab Results  Component Value Date   HGBA1C 6.5 07/23/2019   HGBA1C 6.1 02/27/2019   HGBA1C 5.7 (H) 10/23/2017    Lab Results  Component Value Date   CREATININE 0.9 07/23/2019   CREATININE 1.09 04/16/2019   CREATININE 1.05 02/27/2019    Lab Results  Component Value Date   WBC 6.3 07/23/2019   HGB 13.5 07/23/2019   HCT 41 07/23/2019   PLT 184 07/23/2019   GLUCOSE 119 (H) 04/16/2019   CHOL 169 02/27/2019   TRIG 170.0 (H) 02/27/2019   HDL 36.30 (L) 02/27/2019   LDLDIRECT 114 (H) 09/22/2017   LDLCALC 99 02/27/2019   ALT 26 04/16/2019   AST 18 04/16/2019   NA 140 07/23/2019   K 4.1 07/23/2019   CL 99 04/16/2019   CREATININE 0.9 07/23/2019   BUN 28 (A) 07/23/2019   CO2 31 04/16/2019   TSH 1.04 09/22/2017   PSA 1.76 04/16/2019   HGBA1C 6.5 07/23/2019   MICROALBUR 1.4 02/27/2019    DG Epidural/Nerve Root  Result  Date: 07/12/2016 CLINICAL DATA:  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 Low back pain and left lower extremity pain and numbness. 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 EXAM: EPIDURAL/NERVE ROOT FLUOROSCOPY TIME:  Radiation Exposure Index (as provided by the fluoroscopic device): 91.46 microGray*m^2 Fluoroscopy Time (in minutes and seconds):  25 seconds PROCEDURE: The procedure, risks, benefits, and alternatives were explained to the patient. Questions regarding the procedure were encouraged and answered. The patient understands and consents to the procedure. LEFT L5 NERVE ROOT BLOCK AND TRANSFORAMINAL EPIDURAL: A posterior oblique approach was taken to the intervertebral foramen on the left at L5-S1 using a curved 5 inch 22 gauge spinal needle. Injection of Isovue-M 200 outlined the left L5 nerve root and showed good epidural spread. No vascular opacification is seen. 120 mg of Depo-Medrol mixed with 2 mL of 1% lidocaine were instilled. The procedure was well-tolerated, and the patient was discharged thirty minutes following the injection in good condition. The patient reported complete resolution of left lower extremity pain at the time of discharge. COMPLICATIONS: None IMPRESSION: Technically successful injection consisting of a left L5 nerve root block and transforaminal epidural. Electronically Signed   By: Logan Bores M.D.   On: 07/12/2016 08:23    Assessment & Plan:   Problem List Items Addressed This Visit      Unprioritized   Obesity, morbid, BMI 40.0-49.9 ([place])    I have congratulated him in reduction of   BMI and encouraged  Continued weight loss with goal of 10% of body weigh over the next 6 months using a low glycemic index diet and regular exercise a minimum of 5 days per week.        Hypotestosteronism    He has stopped testosterone replacement due to lack of need and cost        Hypertension    Not at goal.  Adding metoprolol 25 mg bid to current regimen of amlodipine 10 mg ,  hctz 25 mg and losartan 100 mg .  Recent lytes and Cr were normal by [facility] labs done in early July       Relevant Medications   metoprolol tartrate (LOPRESSOR) 25 MG tablet   Hyperlipidemia    Not taking pravastatin .  Lipids are normal  Lab Results  Component Value Date   CHOL 169 02/27/2019   HDL 36.30 (L) 02/27/2019   LDLCALC 99 02/27/2019   LDLDIRECT 114 (H) 09/22/2017   TRIG 170.0 (H) 02/27/2019   CHOLHDL 5 02/27/2019         Relevant Medications   metoprolol tartrate (LOPRESSOR) 25 MG tablet   Diabetes mellitus with neuropathy ([facility])    Currently well-controlled on current medications .  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 Patient is reminded to schedule an annual eye exam and foot exam is normal today. Patient has no microalbuminuria. Patient is tolerating statin therapy for CAD risk reduction and on ACE/ARB for renal protection and hypertension   Lab Results  Component Value Date   HGBA1C 6.1 02/27/2019            I have discontinued Cono Wurtz Jr.'s sildenafil, Azelastine-Fluticasone, Potassium, ergocalciferol, pravastatin, terbinafine, furosemide, loratadine, guaiFENesin, testosterone cypionate, and oxyCODONE. I am also having him start on metoprolol tartrate. Additionally, I am having him maintain his vitamin C, glucosamine-chondroitin, INS SYRINGE/NEEDLE 1CC/28G, aspirin, traMADol, SYRINGE 3CC/20GX1", fluticasone, losartan, hydrochlorothiazide, blood glucose meter kit and supplies, Fifty50 Glucose Meter 2.0, Cholecalciferol, econazole nitrate, Osteo Bi-Flex Adv Triple St, Stimulant Laxative, tamsulosin, amLODipine, meloxicam, and metFORMIN.  Meds ordered this encounter  Medications  . metoprolol tartrate (LOPRESSOR) 25 MG tablet    Sig: Take 1  tablet (25 mg total) by mouth 2 (two) times daily.    Dispense:  180 tablet    Refill:  3   A total of 40 minutes was spent with patient more than half of which was spent in counseling patient on the above mentioned issues , reviewing and explaining recent labs and imaging studies done, and coordination of care. Medications Discontinued During This Encounter  Medication Reason  . pravastatin (PRAVACHOL) 40 MG tablet   . sildenafil (VIAGRA) 100 MG tablet   . testosterone cypionate (DEPOTESTOSTERONE CYPIONATE) 200 MG/ML injection   . terbinafine (LAMISIL) 250 MG tablet   . Potassium 99 MG TABS   . oxyCODONE (OXY IR/ROXICODONE) 5 MG immediate release tablet   . guaiFENesin (MUCINEX) 600 MG 12 hr tablet   . Azelastine-Fluticasone (DYMISTA) 137-50 MCG/ACT SUSP   . ergocalciferol (DRISDOL) 50000 units capsule   . furosemide (LASIX) 20 MG tablet   . loratadine (CLARITIN) 10 MG tablet     Follow-up: Return in about 6 months (around 02/06/2020) for follow up diabetes.   Crecencio Mc, MD

## 2019-08-12 DIAGNOSIS — Z20822 Contact with and (suspected) exposure to covid-19: Secondary | ICD-10-CM | POA: Diagnosis not present

## 2019-08-19 DIAGNOSIS — G4733 Obstructive sleep apnea (adult) (pediatric): Secondary | ICD-10-CM | POA: Diagnosis not present

## 2019-08-21 ENCOUNTER — Ambulatory Visit: Payer: BC Managed Care – PPO | Admitting: Urology

## 2019-08-21 ENCOUNTER — Other Ambulatory Visit: Payer: Self-pay

## 2019-08-21 ENCOUNTER — Encounter: Payer: Self-pay | Admitting: Urology

## 2019-08-21 VITALS — BP 157/85 | HR 71 | Ht 72.0 in | Wt 302.0 lb

## 2019-08-21 DIAGNOSIS — N401 Enlarged prostate with lower urinary tract symptoms: Secondary | ICD-10-CM | POA: Diagnosis not present

## 2019-08-21 DIAGNOSIS — N390 Urinary tract infection, site not specified: Secondary | ICD-10-CM

## 2019-08-21 DIAGNOSIS — R350 Frequency of micturition: Secondary | ICD-10-CM | POA: Diagnosis not present

## 2019-08-21 LAB — BLADDER SCAN AMB NON-IMAGING

## 2019-08-21 NOTE — Progress Notes (Signed)
   08/21/2019 9:00 AM   David Pineda. Dec 03, 1955 [id number]  Reason for visit: Follow up recurrent UTIs, BPH  HPI: I saw David Pineda in urology clinic for follow-up of recurrent UTIs and BPH.  Briefly he is a 64 year old male with obesity, diabetes, hypertension, and sleep apnea who had 2 UTIs in February 2021 and April 2021.  He had mild BPH symptoms of weak stream.  PVRs have always been normal.  We started him on Flomax nightly which he feels may be improving his urinary symptoms.  PVR is normal again today at 42 mL.  He also had a history of an elevated PSA at the time of acute UTI, which normalized after antibiotic treatment.  He denies any complaints since we saw him last.  He continues to take Flomax and cranberry tablets as previously discussed for UTI prevention and his BPH.  He has an upcoming knee surgery in a month.  He feels like his urinary symptoms are well controlled at this time.  We discussed UTI prevention strategies again at length including adequate hydration, timed voiding, and cranberry tablets.  We discussed need for further evaluation if he has further infections including with cystoscopy and consideration of either CT or renal ultrasound.  He like to defer any further work-up at this time with his upcoming knee surgery which is very reasonable.  Continue Flomax 0.4 mg nightly, cranberry tablets for UTI prevention RTC 9 months with PSA prior, PVR, IPSS, symptom check   David Co, MD  [facility] [address] [place], [place] [address] [phone]

## 2019-08-21 NOTE — Patient Instructions (Signed)
Prostate Cancer Screening  Prostate cancer screening is a test that is done to check for the presence of prostate cancer in men. The prostate gland is a walnut-sized gland that is located below the bladder and in front of the rectum in males. The function of the prostate is to add fluid to semen during ejaculation. Prostate cancer is the second most common type of cancer in men. Who should have prostate cancer screening?  Screening recommendations vary based on age and other risk factors. Screening is recommended if:  You are older than age 55. If you are age 55-69, talk with your health care provider about your need for screening and how often screening should be done. Because most prostate cancers are slow growing and will not cause death, screening is generally reserved in this age group for men who have a 10-15-year life expectancy.  You are younger than age 55, and you have these risk factors: ? Being a black male or a male of African descent. ? Having a father, brother, or uncle who has been diagnosed with prostate cancer. The risk is higher if your family member's cancer occurred at an early age. Screening is not recommended if:  You are younger than age 40.  You are between the ages of 40 and 54 and you have no risk factors.  You are 70 years of age or older. At this age, the risks that screening can cause are greater than the benefits that it may provide. If you are at high risk for prostate cancer, your health care provider may recommend that you have screenings more often or that you start screening at a younger age. How is screening for prostate cancer done? The recommended prostate cancer screening test is a blood test called the prostate-specific antigen (PSA) test. PSA is a protein that is made in the prostate. As you age, your prostate naturally produces more PSA. Abnormally high PSA levels may be caused by:  Prostate cancer.  An enlarged prostate that is not caused by cancer  (benign prostatic hyperplasia, BPH). This condition is very common in older men.  A prostate gland infection (prostatitis). Depending on the PSA results, you may need more tests, such as:  A physical exam to check the size of your prostate gland.  Blood and imaging tests.  A procedure to remove tissue samples from your prostate gland for testing (biopsy). What are the benefits of prostate cancer screening?  Screening can help to identify cancer at an early stage, before symptoms start and when the cancer can be treated more easily.  There is a small chance that screening may lower your risk of dying from prostate cancer. The chance is small because prostate cancer is a slow-growing cancer, and most men with prostate cancer die from a different cause. What are the risks of prostate cancer screening? The main risk of prostate cancer screening is diagnosing and treating prostate cancer that would never have caused any symptoms or problems. This is called overdiagnosisand overtreatment. PSA screening cannot tell you if your PSA is high due to cancer or a different cause. A prostate biopsy is the only procedure to diagnose prostate cancer. Even the results of a biopsy may not tell you if your cancer needs to be treated. Slow-growing prostate cancer may not need any treatment other than monitoring, so diagnosing and treating it may cause unnecessary stress or other side effects. A prostate biopsy may also cause:  Infection or fever.  A false negative. This is   a result that shows that you do not have prostate cancer when you actually do have prostate cancer. Questions to ask your health care provider  When should I start prostate cancer screening?  What is my risk for prostate cancer?  How often do I need screening?  What type of screening tests do I need?  How do I get my test results?  What do my results mean?  Do I need treatment? Where to find more information  The American Cancer  Society: www.cancer.org  American Urological Association: www.auanet.org Contact a health care provider if:  You have difficulty urinating.  You have pain when you urinate or ejaculate.  You have blood in your urine or semen.  You have pain in your back or in the area of your prostate. Summary  Prostate cancer is a common type of cancer in men. The prostate gland is located below the bladder and in front of the rectum. This gland adds fluid to semen during ejaculation.  Prostate cancer screening may identify cancer at an early stage, when the cancer can be treated more easily.  The prostate-specific antigen (PSA) test is the recommended screening test for prostate cancer.  Discuss the risks and benefits of prostate cancer screening with your health care provider. If you are age 70 or older, the risks that screening can cause are greater than the benefits that it may provide. This information is not intended to replace advice given to you by your health care provider. Make sure you discuss any questions you have with your health care provider. Document Revised: 08/09/2018 Document Reviewed: 08/09/2018 Elsevier Patient Education  2020 [employer].  

## 2019-08-30 DIAGNOSIS — Z03818 Encounter for observation for suspected exposure to other biological agents ruled out: Secondary | ICD-10-CM | POA: Diagnosis not present

## 2019-08-30 DIAGNOSIS — Z20822 Contact with and (suspected) exposure to covid-19: Secondary | ICD-10-CM | POA: Diagnosis not present

## 2019-09-02 DIAGNOSIS — Z20822 Contact with and (suspected) exposure to covid-19: Secondary | ICD-10-CM | POA: Diagnosis not present

## 2019-09-02 DIAGNOSIS — Z1159 Encounter for screening for other viral diseases: Secondary | ICD-10-CM | POA: Diagnosis not present

## 2019-09-03 DIAGNOSIS — M069 Rheumatoid arthritis, unspecified: Secondary | ICD-10-CM | POA: Diagnosis not present

## 2019-09-03 DIAGNOSIS — G8918 Other acute postprocedural pain: Secondary | ICD-10-CM | POA: Diagnosis not present

## 2019-09-03 DIAGNOSIS — Z96651 Presence of right artificial knee joint: Secondary | ICD-10-CM | POA: Diagnosis not present

## 2019-09-03 DIAGNOSIS — Z79899 Other long term (current) drug therapy: Secondary | ICD-10-CM | POA: Diagnosis not present

## 2019-09-03 DIAGNOSIS — G4733 Obstructive sleep apnea (adult) (pediatric): Secondary | ICD-10-CM | POA: Diagnosis not present

## 2019-09-03 DIAGNOSIS — Z791 Long term (current) use of non-steroidal anti-inflammatories (NSAID): Secondary | ICD-10-CM | POA: Diagnosis not present

## 2019-09-03 DIAGNOSIS — E785 Hyperlipidemia, unspecified: Secondary | ICD-10-CM | POA: Diagnosis not present

## 2019-09-03 DIAGNOSIS — M1712 Unilateral primary osteoarthritis, left knee: Secondary | ICD-10-CM | POA: Diagnosis not present

## 2019-09-03 DIAGNOSIS — Z8673 Personal history of transient ischemic attack (TIA), and cerebral infarction without residual deficits: Secondary | ICD-10-CM | POA: Diagnosis not present

## 2019-09-03 DIAGNOSIS — E669 Obesity, unspecified: Secondary | ICD-10-CM | POA: Diagnosis not present

## 2019-09-03 DIAGNOSIS — Z7982 Long term (current) use of aspirin: Secondary | ICD-10-CM | POA: Diagnosis not present

## 2019-09-03 DIAGNOSIS — I10 Essential (primary) hypertension: Secondary | ICD-10-CM | POA: Diagnosis not present

## 2019-09-03 DIAGNOSIS — E1140 Type 2 diabetes mellitus with diabetic neuropathy, unspecified: Secondary | ICD-10-CM | POA: Diagnosis not present

## 2019-09-03 DIAGNOSIS — Z6839 Body mass index (BMI) 39.0-39.9, adult: Secondary | ICD-10-CM | POA: Diagnosis not present

## 2019-09-03 DIAGNOSIS — Z8616 Personal history of COVID-19: Secondary | ICD-10-CM | POA: Diagnosis not present

## 2019-09-03 DIAGNOSIS — Z7984 Long term (current) use of oral hypoglycemic drugs: Secondary | ICD-10-CM | POA: Diagnosis not present

## 2019-09-03 DIAGNOSIS — Z87891 Personal history of nicotine dependence: Secondary | ICD-10-CM | POA: Diagnosis not present

## 2019-09-04 DIAGNOSIS — Z87891 Personal history of nicotine dependence: Secondary | ICD-10-CM | POA: Diagnosis not present

## 2019-09-04 DIAGNOSIS — E669 Obesity, unspecified: Secondary | ICD-10-CM | POA: Diagnosis not present

## 2019-09-04 DIAGNOSIS — Z7982 Long term (current) use of aspirin: Secondary | ICD-10-CM | POA: Diagnosis not present

## 2019-09-04 DIAGNOSIS — I10 Essential (primary) hypertension: Secondary | ICD-10-CM | POA: Diagnosis not present

## 2019-09-04 DIAGNOSIS — E785 Hyperlipidemia, unspecified: Secondary | ICD-10-CM | POA: Diagnosis not present

## 2019-09-04 DIAGNOSIS — M069 Rheumatoid arthritis, unspecified: Secondary | ICD-10-CM | POA: Diagnosis not present

## 2019-09-04 DIAGNOSIS — G4733 Obstructive sleep apnea (adult) (pediatric): Secondary | ICD-10-CM | POA: Diagnosis not present

## 2019-09-04 DIAGNOSIS — Z791 Long term (current) use of non-steroidal anti-inflammatories (NSAID): Secondary | ICD-10-CM | POA: Diagnosis not present

## 2019-09-04 DIAGNOSIS — Z8673 Personal history of transient ischemic attack (TIA), and cerebral infarction without residual deficits: Secondary | ICD-10-CM | POA: Diagnosis not present

## 2019-09-04 DIAGNOSIS — Z7984 Long term (current) use of oral hypoglycemic drugs: Secondary | ICD-10-CM | POA: Diagnosis not present

## 2019-09-04 DIAGNOSIS — Z8616 Personal history of COVID-19: Secondary | ICD-10-CM | POA: Diagnosis not present

## 2019-09-04 DIAGNOSIS — Z96651 Presence of right artificial knee joint: Secondary | ICD-10-CM | POA: Diagnosis not present

## 2019-09-04 DIAGNOSIS — M1712 Unilateral primary osteoarthritis, left knee: Secondary | ICD-10-CM | POA: Diagnosis not present

## 2019-09-04 DIAGNOSIS — Z6839 Body mass index (BMI) 39.0-39.9, adult: Secondary | ICD-10-CM | POA: Diagnosis not present

## 2019-09-04 DIAGNOSIS — E1140 Type 2 diabetes mellitus with diabetic neuropathy, unspecified: Secondary | ICD-10-CM | POA: Diagnosis not present

## 2019-09-04 DIAGNOSIS — Z79899 Other long term (current) drug therapy: Secondary | ICD-10-CM | POA: Diagnosis not present

## 2019-09-18 ENCOUNTER — Telehealth: Payer: Self-pay | Admitting: Internal Medicine

## 2019-09-18 DIAGNOSIS — R300 Dysuria: Secondary | ICD-10-CM

## 2019-09-18 NOTE — Telephone Encounter (Signed)
Patient called in stated that he thinks he has a UTI

## 2019-09-18 NOTE — Telephone Encounter (Signed)
Can pt come by and drop off a urine and then schedule him for the next available virtual visit?

## 2019-09-18 NOTE — Telephone Encounter (Signed)
Urine Labs and PSA ordered .Marland Kitchen  Yes he will need a follow up video visit 15 min to discuss results

## 2019-09-19 NOTE — Telephone Encounter (Signed)
Spoke with pt to see about scheduling him for lab appt and pt stated that he has already had his PSA checked by his urologist. Pt also stated that he is not sure that it is a UTI and is wondering if it is his prostate that is causing his urinary frequency. Pt was advised that if he thinks it could be his prostate to call his urologist and see if they can see him and if not to give [place] a call back and we could schedule him an appt. Pt gave a verbal understanding.

## 2019-09-24 ENCOUNTER — Other Ambulatory Visit: Payer: Self-pay

## 2019-09-24 ENCOUNTER — Encounter: Payer: Self-pay | Admitting: Physician Assistant

## 2019-09-24 ENCOUNTER — Ambulatory Visit (INDEPENDENT_AMBULATORY_CARE_PROVIDER_SITE_OTHER): Payer: BC Managed Care – PPO | Admitting: Physician Assistant

## 2019-09-24 VITALS — BP 146/79 | HR 73 | Ht 71.0 in | Wt 292.0 lb

## 2019-09-24 DIAGNOSIS — R350 Frequency of micturition: Secondary | ICD-10-CM

## 2019-09-24 DIAGNOSIS — R351 Nocturia: Secondary | ICD-10-CM | POA: Diagnosis not present

## 2019-09-24 LAB — MICROSCOPIC EXAMINATION
Bacteria, UA: NONE SEEN
Epithelial Cells (non renal): NONE SEEN /hpf (ref 0–10)
RBC, Urine: NONE SEEN /hpf (ref 0–2)

## 2019-09-24 LAB — URINALYSIS, COMPLETE
Bilirubin, UA: NEGATIVE
Glucose, UA: NEGATIVE
Ketones, UA: NEGATIVE
Leukocytes,UA: NEGATIVE
Nitrite, UA: NEGATIVE
Protein,UA: NEGATIVE
RBC, UA: NEGATIVE
Specific Gravity, UA: 1.01 (ref 1.005–1.030)
Urobilinogen, Ur: 0.2 mg/dL (ref 0.2–1.0)
pH, UA: 5 (ref 5.0–7.5)

## 2019-09-24 LAB — BLADDER SCAN AMB NON-IMAGING

## 2019-09-24 NOTE — Progress Notes (Signed)
09/24/2019 4:58 PM   [name]. [birth year] [id number]  CC: Chief Complaint  Patient presents with  . Urinary Frequency   HPI: David Pineda. is a 64 y.o. 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   Today he reports two days of nocturia every 2 hours. He adjusted his nighttime fluid intake with improvement in his symptoms. He is unsure if he is infected today.  In-office UA and microscopy today pan-negative. PVR 75m.  PMH: Past Medical History:  Diagnosis Date  . Arthritis   . COVID-19 virus infection 10/29/2018  . [place] mellitus without complication ([place] [place]   . Elevated blood pressure reading   . Hyperlipidemia   . Hypertension   . Hypotestosteronism   . Obesity, morbid, BMI 40.0-49.9 ([facility]   . OSA on CPAP     Surgical History: Past Surgical History:  Procedure Laterality Date  . FOOT SURGERY     left     Home Medications:  Allergies as of 09/24/2019      Reactions   Shellfish Allergy Swelling   Once, has had it multiple times since then without issues.  One occurrence, but has had it multiple times since then without issues.       Medication List       Accurate as of September 24, 2019  4:58 PM. If you have any questions, ask your nurse or doctor.        amLODipine 10 MG tablet Commonly known as: NORVASC TAKE 1 TABLET(10 MG) BY MOUTH DAILY   aspirin 325 MG tablet Take 325 mg by mouth daily.   blood glucose meter kit and supplies Use to check blood sugars twice daily. (FOR ICD-10 E10.9, E11.9).   Cholecalciferol 50 MCG (2000 UT) Caps Take by mouth.   econazole nitrate 1 % cream APPLY EXTERNALLY TO RASH TWICE DAILY UNTIL IMPROVED   Fifty50 Glucose Meter 2.0 w/Device Kit 2 (two) times daily And supplies   fluticasone 50 MCG/ACT nasal spray Commonly known as: FLONASE Place 2 sprays into both nostrils daily.   glipiZIDE 5 MG tablet Commonly known  as: GLUCOTROL Take 1 tablet (5 mg total) by mouth 2 (two) times daily before a meal.   glucosamine-chondroitin 500-400 MG tablet Take 1 tablet by mouth 3 (three) times daily.   hydrochlorothiazide 25 MG tablet Commonly known as: HYDRODIURIL Take 1 tablet (25 mg total) by mouth daily.   INS SYRINGE/NEEDLE 1CC/28G 28G X 1/2" 1 ML Misc Commonly known as: B-D INSULIN SYRINGE 1CC/28G 1 Syringe by Does not apply route daily after supper.   losartan 100 MG tablet Commonly known as: COZAAR TAKE 1 TABLET(100 MG) BY MOUTH DAILY   meloxicam 15 MG tablet Commonly known as: MOBIC TAKE 1 TABLET(15 MG) BY MOUTH DAILY   metFORMIN 1000 MG tablet Commonly known as: GLUCOPHAGE TAKE 1 TABLET(1000 MG) BY MOUTH TWICE DAILY WITH A MEAL   metoprolol tartrate 25 MG tablet Commonly known as: LOPRESSOR Take 1 tablet (25 mg total) by mouth 2 (two) times daily.   Osteo Bi-Flex Adv Triple St Tabs Take by mouth.   oxyCODONE 5 MG immediate release tablet Commonly known as: Oxy IR/ROXICODONE Take by mouth.   pantoprazole 40 MG tablet Commonly known as: PROTONIX Take by mouth.   Stimulant Laxative 8.6-50 MG tablet Generic drug: senna-docusate TAKE 2 TABLETS BY MOUTH TWICE DAILY FOR 8 DAYS   SYRINGE 3CC/20GX1" 20G X 1" 3 ML Misc  1 Syringe by Does not apply route once a week.   tamsulosin 0.4 MG Caps capsule Commonly known as: FLOMAX Take 1 capsule (0.4 mg total) by mouth daily.   traMADol 50 MG tablet Commonly known as: ULTRAM Take 1 tablet (50 mg total) by mouth every 6 (six) hours as needed. for pain   vitamin C 100 MG tablet Take 100 mg by mouth daily.       Allergies:  Allergies  Allergen Reactions  . Shellfish Allergy Swelling    Once, has had it multiple times since then without issues.  One occurrence, but has had it multiple times since then without issues.     Family History: Family History  Adopted: Yes  Problem Relation Age of Onset  . Diabetes Mother   .  Hyperlipidemia Father   . Hypertension Father     Social History:   reports that he quit smoking about 24 years ago. His smoking use included cigarettes. He has a 21.00 pack-year smoking history. He has quit using smokeless tobacco. He reports current alcohol use of about 14.0 standard drinks of alcohol per week. He reports that he does not use drugs.  Physical Exam: BP (!) 146/79 (BP Location: Left Arm, Patient Position: Sitting, Cuff Size: Large)   Pulse 73   Ht 5' 11"  (1.803 m)   Wt 292 lb (132.5 kg)   BMI 40.73 kg/m   Constitutional:  Alert and oriented, no acute distress, nontoxic appearing HEENT: [place], AT Cardiovascular: No clubbing, cyanosis, or edema Respiratory: Normal respiratory effort, no increased work of breathing Skin: No rashes, bruises or suspicious lesions Neurologic: Grossly intact, no focal deficits, moving all 4 extremities Psychiatric: Normal mood and affect  Laboratory Data: Results for orders placed or performed in visit on 09/24/19  Microscopic Examination   Urine  Result Value Ref Range   WBC, UA 0-5 0 - 5 /hpf   RBC None seen 0 - 2 /hpf   Epithelial Cells (non renal) None seen 0 - 10 /hpf   Bacteria, UA None seen None seen/Few  Urinalysis, Complete  Result Value Ref Range   Specific Gravity, UA 1.010 1.005 - 1.030   pH, UA 5.0 5.0 - 7.5   Color, UA Yellow Yellow   Appearance Ur Clear Clear   Leukocytes,UA Negative Negative   Protein,UA Negative Negative/Trace   Glucose, UA Negative Negative   Ketones, UA Negative Negative   RBC, UA Negative Negative   Bilirubin, UA Negative Negative   Urobilinogen, Ur 0.2 0.2 - 1.0 mg/dL   Nitrite, UA Negative Negative   Microscopic Examination See below:   Bladder Scan (Post Void Residual) in office  Result Value Ref Range   Scan Result 78m    Assessment & Plan:   1. Nocturia Improved with PM fluid restriction. UA benign today, no other infective symptoms. No treatment indicated. - Bladder Scan (Post  Void Residual) in office - Urinalysis, Complete    Return if symptoms worsen or fail to improve.  SDebroah Loop PA-C  [facility] [address] SPymatuning NorthBWhiting [place] [phone]

## 2019-09-25 DIAGNOSIS — M6281 Muscle weakness (generalized): Secondary | ICD-10-CM | POA: Diagnosis not present

## 2019-09-25 DIAGNOSIS — M25562 Pain in left knee: Secondary | ICD-10-CM | POA: Diagnosis not present

## 2019-09-25 DIAGNOSIS — G8929 Other chronic pain: Secondary | ICD-10-CM | POA: Diagnosis not present

## 2019-09-25 DIAGNOSIS — Z4789 Encounter for other orthopedic aftercare: Secondary | ICD-10-CM | POA: Diagnosis not present

## 2019-09-30 DIAGNOSIS — G8929 Other chronic pain: Secondary | ICD-10-CM | POA: Diagnosis not present

## 2019-09-30 DIAGNOSIS — Z4789 Encounter for other orthopedic aftercare: Secondary | ICD-10-CM | POA: Diagnosis not present

## 2019-09-30 DIAGNOSIS — M6281 Muscle weakness (generalized): Secondary | ICD-10-CM | POA: Diagnosis not present

## 2019-09-30 DIAGNOSIS — M25562 Pain in left knee: Secondary | ICD-10-CM | POA: Diagnosis not present

## 2019-10-03 DIAGNOSIS — G8929 Other chronic pain: Secondary | ICD-10-CM | POA: Diagnosis not present

## 2019-10-03 DIAGNOSIS — Z4789 Encounter for other orthopedic aftercare: Secondary | ICD-10-CM | POA: Diagnosis not present

## 2019-10-03 DIAGNOSIS — M6281 Muscle weakness (generalized): Secondary | ICD-10-CM | POA: Diagnosis not present

## 2019-10-03 DIAGNOSIS — M25562 Pain in left knee: Secondary | ICD-10-CM | POA: Diagnosis not present

## 2019-10-07 DIAGNOSIS — Z4789 Encounter for other orthopedic aftercare: Secondary | ICD-10-CM | POA: Diagnosis not present

## 2019-10-07 DIAGNOSIS — M25562 Pain in left knee: Secondary | ICD-10-CM | POA: Diagnosis not present

## 2019-10-07 DIAGNOSIS — G8929 Other chronic pain: Secondary | ICD-10-CM | POA: Diagnosis not present

## 2019-10-07 DIAGNOSIS — M6281 Muscle weakness (generalized): Secondary | ICD-10-CM | POA: Diagnosis not present

## 2019-10-14 DIAGNOSIS — M25561 Pain in right knee: Secondary | ICD-10-CM | POA: Diagnosis not present

## 2019-10-14 DIAGNOSIS — Z4789 Encounter for other orthopedic aftercare: Secondary | ICD-10-CM | POA: Diagnosis not present

## 2019-10-14 DIAGNOSIS — G8929 Other chronic pain: Secondary | ICD-10-CM | POA: Diagnosis not present

## 2019-10-14 DIAGNOSIS — M6281 Muscle weakness (generalized): Secondary | ICD-10-CM | POA: Diagnosis not present

## 2019-10-14 DIAGNOSIS — M25562 Pain in left knee: Secondary | ICD-10-CM | POA: Diagnosis not present

## 2019-10-16 DIAGNOSIS — Z4789 Encounter for other orthopedic aftercare: Secondary | ICD-10-CM | POA: Diagnosis not present

## 2019-10-16 DIAGNOSIS — M25562 Pain in left knee: Secondary | ICD-10-CM | POA: Diagnosis not present

## 2019-10-16 DIAGNOSIS — M6281 Muscle weakness (generalized): Secondary | ICD-10-CM | POA: Diagnosis not present

## 2019-10-16 DIAGNOSIS — G8929 Other chronic pain: Secondary | ICD-10-CM | POA: Diagnosis not present

## 2019-10-16 DIAGNOSIS — M25561 Pain in right knee: Secondary | ICD-10-CM | POA: Diagnosis not present

## 2019-10-21 DIAGNOSIS — M25562 Pain in left knee: Secondary | ICD-10-CM | POA: Diagnosis not present

## 2019-10-21 DIAGNOSIS — Z4789 Encounter for other orthopedic aftercare: Secondary | ICD-10-CM | POA: Diagnosis not present

## 2019-10-21 DIAGNOSIS — G8929 Other chronic pain: Secondary | ICD-10-CM | POA: Diagnosis not present

## 2019-10-21 DIAGNOSIS — M25561 Pain in right knee: Secondary | ICD-10-CM | POA: Diagnosis not present

## 2019-10-21 DIAGNOSIS — M6281 Muscle weakness (generalized): Secondary | ICD-10-CM | POA: Diagnosis not present

## 2019-10-24 DIAGNOSIS — M25561 Pain in right knee: Secondary | ICD-10-CM | POA: Diagnosis not present

## 2019-10-24 DIAGNOSIS — G8929 Other chronic pain: Secondary | ICD-10-CM | POA: Diagnosis not present

## 2019-10-24 DIAGNOSIS — M25562 Pain in left knee: Secondary | ICD-10-CM | POA: Diagnosis not present

## 2019-10-24 DIAGNOSIS — Z4789 Encounter for other orthopedic aftercare: Secondary | ICD-10-CM | POA: Diagnosis not present

## 2019-10-24 DIAGNOSIS — M6281 Muscle weakness (generalized): Secondary | ICD-10-CM | POA: Diagnosis not present

## 2019-10-28 DIAGNOSIS — M25462 Effusion, left knee: Secondary | ICD-10-CM | POA: Diagnosis not present

## 2019-10-28 DIAGNOSIS — Z96652 Presence of left artificial knee joint: Secondary | ICD-10-CM | POA: Diagnosis not present

## 2019-10-30 DIAGNOSIS — G8929 Other chronic pain: Secondary | ICD-10-CM | POA: Diagnosis not present

## 2019-10-30 DIAGNOSIS — M25562 Pain in left knee: Secondary | ICD-10-CM | POA: Diagnosis not present

## 2019-10-30 DIAGNOSIS — Z4789 Encounter for other orthopedic aftercare: Secondary | ICD-10-CM | POA: Diagnosis not present

## 2019-10-30 DIAGNOSIS — M6281 Muscle weakness (generalized): Secondary | ICD-10-CM | POA: Diagnosis not present

## 2019-10-30 DIAGNOSIS — M25561 Pain in right knee: Secondary | ICD-10-CM | POA: Diagnosis not present

## 2019-11-04 DIAGNOSIS — M25561 Pain in right knee: Secondary | ICD-10-CM | POA: Diagnosis not present

## 2019-11-04 DIAGNOSIS — G8929 Other chronic pain: Secondary | ICD-10-CM | POA: Diagnosis not present

## 2019-11-04 DIAGNOSIS — M25562 Pain in left knee: Secondary | ICD-10-CM | POA: Diagnosis not present

## 2019-11-04 DIAGNOSIS — Z4789 Encounter for other orthopedic aftercare: Secondary | ICD-10-CM | POA: Diagnosis not present

## 2019-11-04 DIAGNOSIS — M6281 Muscle weakness (generalized): Secondary | ICD-10-CM | POA: Diagnosis not present

## 2019-11-11 DIAGNOSIS — M25562 Pain in left knee: Secondary | ICD-10-CM | POA: Diagnosis not present

## 2019-11-11 DIAGNOSIS — M6281 Muscle weakness (generalized): Secondary | ICD-10-CM | POA: Diagnosis not present

## 2019-11-11 DIAGNOSIS — Z4789 Encounter for other orthopedic aftercare: Secondary | ICD-10-CM | POA: Diagnosis not present

## 2019-11-11 DIAGNOSIS — G8929 Other chronic pain: Secondary | ICD-10-CM | POA: Diagnosis not present

## 2019-11-18 DIAGNOSIS — G8929 Other chronic pain: Secondary | ICD-10-CM | POA: Diagnosis not present

## 2019-11-18 DIAGNOSIS — Z4789 Encounter for other orthopedic aftercare: Secondary | ICD-10-CM | POA: Diagnosis not present

## 2019-11-18 DIAGNOSIS — M25562 Pain in left knee: Secondary | ICD-10-CM | POA: Diagnosis not present

## 2019-11-18 DIAGNOSIS — G4733 Obstructive sleep apnea (adult) (pediatric): Secondary | ICD-10-CM | POA: Diagnosis not present

## 2019-11-18 DIAGNOSIS — M6281 Muscle weakness (generalized): Secondary | ICD-10-CM | POA: Diagnosis not present

## 2019-12-10 ENCOUNTER — Other Ambulatory Visit: Payer: Self-pay | Admitting: Internal Medicine

## 2019-12-19 ENCOUNTER — Other Ambulatory Visit: Payer: Self-pay | Admitting: Internal Medicine

## 2019-12-24 DIAGNOSIS — Z1211 Encounter for screening for malignant neoplasm of colon: Secondary | ICD-10-CM | POA: Diagnosis not present

## 2019-12-24 DIAGNOSIS — I10 Essential (primary) hypertension: Secondary | ICD-10-CM | POA: Diagnosis not present

## 2019-12-24 DIAGNOSIS — K621 Rectal polyp: Secondary | ICD-10-CM | POA: Diagnosis not present

## 2019-12-24 DIAGNOSIS — Z86010 Personal history of colonic polyps: Secondary | ICD-10-CM | POA: Diagnosis not present

## 2019-12-24 DIAGNOSIS — K5730 Diverticulosis of large intestine without perforation or abscess without bleeding: Secondary | ICD-10-CM | POA: Diagnosis not present

## 2019-12-24 DIAGNOSIS — K635 Polyp of colon: Secondary | ICD-10-CM | POA: Diagnosis not present

## 2019-12-24 DIAGNOSIS — D126 Benign neoplasm of colon, unspecified: Secondary | ICD-10-CM | POA: Diagnosis not present

## 2019-12-24 LAB — HM COLONOSCOPY

## 2020-01-06 ENCOUNTER — Encounter: Payer: Self-pay | Admitting: Internal Medicine

## 2020-01-06 DIAGNOSIS — D126 Benign neoplasm of colon, unspecified: Secondary | ICD-10-CM | POA: Insufficient documentation

## 2020-02-06 ENCOUNTER — Ambulatory Visit: Payer: BC Managed Care – PPO | Admitting: Internal Medicine

## 2020-02-17 DIAGNOSIS — G4733 Obstructive sleep apnea (adult) (pediatric): Secondary | ICD-10-CM | POA: Diagnosis not present

## 2020-02-19 ENCOUNTER — Ambulatory Visit (INDEPENDENT_AMBULATORY_CARE_PROVIDER_SITE_OTHER): Payer: BC Managed Care – PPO | Admitting: Internal Medicine

## 2020-02-19 ENCOUNTER — Encounter: Payer: Self-pay | Admitting: Internal Medicine

## 2020-02-19 ENCOUNTER — Other Ambulatory Visit: Payer: Self-pay

## 2020-02-19 VITALS — BP 146/78 | HR 71 | Temp 98.0°F | Resp 18 | Ht 71.0 in | Wt 309.1 lb

## 2020-02-19 DIAGNOSIS — R7401 Elevation of levels of liver transaminase levels: Secondary | ICD-10-CM

## 2020-02-19 DIAGNOSIS — E782 Mixed hyperlipidemia: Secondary | ICD-10-CM

## 2020-02-19 DIAGNOSIS — I10 Essential (primary) hypertension: Secondary | ICD-10-CM

## 2020-02-19 DIAGNOSIS — Z23 Encounter for immunization: Secondary | ICD-10-CM | POA: Diagnosis not present

## 2020-02-19 DIAGNOSIS — Z114 Encounter for screening for human immunodeficiency virus [HIV]: Secondary | ICD-10-CM

## 2020-02-19 DIAGNOSIS — E1140 Type 2 diabetes mellitus with diabetic neuropathy, unspecified: Secondary | ICD-10-CM | POA: Diagnosis not present

## 2020-02-19 LAB — MICROALBUMIN / CREATININE URINE RATIO
Creatinine,U: 134.6 mg/dL
Microalb Creat Ratio: 1.3 mg/g (ref 0.0–30.0)
Microalb, Ur: 1.8 mg/dL (ref 0.0–1.9)

## 2020-02-19 LAB — COMPREHENSIVE METABOLIC PANEL
ALT: 59 U/L — ABNORMAL HIGH (ref 0–53)
AST: 36 U/L (ref 0–37)
Albumin: 4.7 g/dL (ref 3.5–5.2)
Alkaline Phosphatase: 64 U/L (ref 39–117)
BUN: 21 mg/dL (ref 6–23)
CO2: 25 mEq/L (ref 19–32)
Calcium: 9.7 mg/dL (ref 8.4–10.5)
Chloride: 103 mEq/L (ref 96–112)
Creatinine, Ser: 0.99 mg/dL (ref 0.40–1.50)
GFR: 81.85 mL/min (ref >60.00)
Glucose, Bld: 140 mg/dL — ABNORMAL HIGH (ref 70–99)
Potassium: 4.2 mEq/L (ref 3.5–5.1)
Sodium: 136 mEq/L (ref 135–145)
Total Bilirubin: 0.7 mg/dL (ref 0.2–1.2)
Total Protein: 7.3 g/dL (ref 6.0–8.3)

## 2020-02-19 LAB — LIPID PANEL
Cholesterol: 195 mg/dL (ref 0–200)
HDL: 47.8 mg/dL (ref >39.00)
NonHDL: 147.51
Total CHOL/HDL Ratio: 4
Triglycerides: 203 mg/dL — ABNORMAL HIGH (ref 0.0–149.0)
VLDL: 40.6 mg/dL — ABNORMAL HIGH (ref 0.0–40.0)

## 2020-02-19 LAB — HEMOGLOBIN A1C: Hgb A1c MFr Bld: 7 % — ABNORMAL HIGH (ref 4.6–6.5)

## 2020-02-19 LAB — LDL CHOLESTEROL, DIRECT: Direct LDL: 122 mg/dL

## 2020-02-19 MED ORDER — SULFAMETHOXAZOLE-TRIMETHOPRIM 800-160 MG PO TABS: 1.0000 | ORAL_TABLET | Freq: Two times a day (BID) | ORAL | 0 refills | Status: DC

## 2020-02-19 NOTE — Progress Notes (Signed)
Subjective:  Patient ID: David Pineda., male    DOB: 27-Apr-1955  Age: 65 y.o. MRN: [id number]  CC: The primary encounter diagnosis was Type 2 diabetes mellitus with diabetic neuropathy, without long-term current use of insulin ([place]). Diagnoses of Screening for HIV (human immunodeficiency virus), Need for immunization against influenza, Primary hypertension, and Mixed hyperlipidemia were also pertinent to this visit.  HPI David Pineda. presents for FOLLOW UP on multiple issues including type 2 DM , obesity, hypertension and hyperlipidemia .  This visit occurred during the SARS-CoV-2 public health emergency.  Safety protocols were in place, including screening questions prior to the visit, additional usage of staff PPE, and extensive cleaning of exam room while observing appropriate contact time as indicated for disinfecting solutions.   SMALL NONTENDER CYST ON POSTERIOR RIGHT THIGH NOTICED ONE WEEK AGO  By physical therapy. NOT RED WEIGHT GAIN   29 lbs since April 2021 . Carb modified diet most days  BILATERAL KNEE REPLACEMENTS LAST ONE SEPT 2021  Right foot plantar surface  nodule tendon only on weight bearing,  Now not tender  Has started walking again with new dog,  Notices some hip pain after walking  . Averaging 3000 to 4000 steps daily   BP elevated today on 4 meds . Can't name his meds.  Med review looks like amlodipine has run out  Seeing eye doctor for diabetic eye exam  Received flu vaccine today Wants shingles vaccine advised to return one week    T2DM:  he  feels generally well,  But is not  exercising regularly and has gained 29 lbs. Checking  blood sugars less than once daily at variable times, usually only if she feels she may be having a hypoglycemic event.  BS have been under 130 fasting and < 160 post prandially.  Denies any recent hypoglyemic events.  Taking   medications as directed. Following a carbohydrate modified diet 6 days per week. Denies numbness,  burning and tingling of extremities. Appetite is good.    Outpatient Medications Prior to Visit  Medication Sig Dispense Refill  . amLODipine (NORVASC) 10 MG tablet TAKE 1 TABLET(10 MG) BY MOUTH DAILY 90 tablet 1  . Ascorbic Acid (VITAMIN C) 100 MG tablet Take 100 mg by mouth daily.    Marland Kitchen aspirin 325 MG tablet Take 325 mg by mouth daily.    . Blood Glucose Monitoring Suppl (FIFTY50 GLUCOSE METER 2.0) w/Device KIT 2 (two) times daily And supplies    . Cholecalciferol 50 MCG (2000 UT) CAPS Take by mouth.    Marland Kitchen glipiZIDE (GLUCOTROL) 5 MG tablet Take 1 tablet (5 mg total) by mouth 2 (two) times daily before a meal. 180 tablet 1  . hydrochlorothiazide (HYDRODIURIL) 25 MG tablet Take 1 tablet (25 mg total) by mouth daily. 90 tablet 3  . losartan (COZAAR) 100 MG tablet TAKE 1 TABLET(100 MG) BY MOUTH DAILY 90 tablet 3  . meloxicam (MOBIC) 15 MG tablet TAKE 1 TABLET(15 MG) BY MOUTH DAILY 90 tablet 1  . metFORMIN (GLUCOPHAGE) 1000 MG tablet TAKE 1 TABLET(1000 MG) BY MOUTH TWICE DAILY WITH A MEAL 180 tablet 1  . metoprolol tartrate (LOPRESSOR) 25 MG tablet Take 1 tablet (25 mg total) by mouth 2 (two) times daily. 180 tablet 3  . tamsulosin (FLOMAX) 0.4 MG CAPS capsule Take 1 capsule (0.4 mg total) by mouth daily. 30 capsule 11  . traMADol (ULTRAM) 50 MG tablet Take 1 tablet (50 mg total) by mouth every 6 (six) hours  as needed. for pain 120 tablet 0  . blood glucose meter kit and supplies Use to check blood sugars twice daily. (FOR ICD-10 E10.9, E11.9). (Patient not taking: Reported on 02/19/2020) 1 each 0  . econazole nitrate 1 % cream APPLY EXTERNALLY TO RASH TWICE DAILY UNTIL IMPROVED (Patient not taking: Reported on 02/19/2020)    . fluticasone (FLONASE) 50 MCG/ACT nasal spray Place 2 sprays into both nostrils daily. (Patient not taking: Reported on 02/19/2020) 16 g 1  . glucosamine-chondroitin 500-400 MG tablet Take 1 tablet by mouth 3 (three) times daily.    . INS SYRINGE/NEEDLE 1CC/28G (B-D INSULIN SYRINGE  1CC/28G) 28G X 1/2" 1 ML MISC 1 Syringe by Does not apply route daily after supper. 100 each 0  . Misc Natural Products (OSTEO BI-FLEX ADV TRIPLE ST) TABS Take by mouth.    . oxyCODONE (OXY IR/ROXICODONE) 5 MG immediate release tablet Take by mouth. (Patient not taking: Reported on 02/19/2020)    . pantoprazole (PROTONIX) 40 MG tablet Take by mouth.    Marland Kitchen STIMULANT LAXATIVE 8.6-50 MG tablet TAKE 2 TABLETS BY MOUTH TWICE DAILY FOR 8 DAYS    . Syringe/Needle, Disp, (SYRINGE 3CC/20GX1") 20G X 1" 3 ML MISC 1 Syringe by Does not apply route once a week. 24 each 1   No facility-administered medications prior to visit.    Review of Systems;  Patient denies headache, fevers, malaise, unintentional weight loss, skin rash, eye pain, sinus congestion and sinus pain, sore throat, dysphagia,  hemoptysis , cough, dyspnea, wheezing, chest pain, palpitations, orthopnea, edema, abdominal pain, nausea, melena, diarrhea, constipation, flank pain, dysuria, hematuria, urinary  Frequency, nocturia, numbness, tingling, seizures,  Focal weakness, Loss of consciousness,  Tremor, insomnia, depression, anxiety, and suicidal ideation.      Objective:  BP (!) 146/78 (BP Location: Left Arm, Patient Position: Sitting, Cuff Size: Large)   Pulse 71   Temp 98 F (36.7 C) (Oral)   Resp 18   Ht 5' 11"  (1.803 m)   Wt (!) 309 lb 2 oz (140.2 kg)   SpO2 97%   BMI 43.11 kg/m   BP Readings from Last 3 Encounters:  02/19/20 (!) 146/78  09/24/19 (!) 146/79  08/21/19 (!) 157/85    Wt Readings from Last 3 Encounters:  02/19/20 (!) 309 lb 2 oz (140.2 kg)  09/24/19 292 lb (132.5 kg)  08/21/19 (!) 302 lb (137 kg)    General appearance: alert, cooperative and appears stated age Ears: normal TM's and external ear canals both ears Throat: lips, mucosa, and tongue normal; teeth and gums normal Neck: no adenopathy, no carotid bruit, supple, symmetrical, trachea midline and thyroid not enlarged, symmetric, no  tenderness/mass/nodules Back: symmetric, no curvature. ROM normal. No CVA tenderness. Lungs: clear to auscultation bilaterally Heart: regular rate and rhythm, S1, S2 normal, no murmur, click, rub or gallop Abdomen: soft, non-tender; bowel sounds normal; no masses,  no organomegaly Pulses: 2+ and symmetric Skin: dime sized sebaceous cyst vs lipoma n  right posterior thigh.  Lymph nodes: Cervical, supraclavicular, and axillary nodes normal.  Lab Results  Component Value Date   HGBA1C 7.0 (H) 02/19/2020   HGBA1C 6.5 07/23/2019   HGBA1C 6.1 02/27/2019    Lab Results  Component Value Date   CREATININE 0.99 02/19/2020   CREATININE 0.9 07/23/2019   CREATININE 1.09 04/16/2019    Lab Results  Component Value Date   WBC 6.3 07/23/2019   HGB 13.5 07/23/2019   HCT 41 07/23/2019   PLT 184 07/23/2019  GLUCOSE 140 (H) 02/19/2020   CHOL 195 02/19/2020   TRIG 203.0 (H) 02/19/2020   HDL 47.80 02/19/2020   LDLDIRECT 122.0 02/19/2020   LDLCALC 99 02/27/2019   ALT 59 (H) 02/19/2020   AST 36 02/19/2020   NA 136 02/19/2020   K 4.2 02/19/2020   CL 103 02/19/2020   CREATININE 0.99 02/19/2020   BUN 21 02/19/2020   CO2 25 02/19/2020   TSH 1.04 09/22/2017   PSA 1.76 04/16/2019   HGBA1C 7.0 (H) 02/19/2020   MICROALBUR 1.8 02/19/2020    DG Epidural/Nerve Root  Result Date: 07/12/2016 CLINICAL DATA:  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 Low back pain and left lower extremity pain and numbness. 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 EXAM: EPIDURAL/NERVE ROOT FLUOROSCOPY TIME:  Radiation Exposure Index (as provided by the fluoroscopic device): 91.46 microGray*m^2 Fluoroscopy Time (in minutes and seconds):  25 seconds PROCEDURE: The procedure, risks, benefits, and alternatives were explained to the patient. Questions regarding the procedure were encouraged and answered. The patient understands and  consents to the procedure. LEFT L5 NERVE ROOT BLOCK AND TRANSFORAMINAL EPIDURAL: A posterior oblique approach was taken to the intervertebral foramen on the left at L5-S1 using a curved 5 inch 22 gauge spinal needle. Injection of Isovue-M 200 outlined the left L5 nerve root and showed good epidural spread. No vascular opacification is seen. 120 mg of Depo-Medrol mixed with 2 mL of 1% lidocaine were instilled. The procedure was well-tolerated, and the patient was discharged thirty minutes following the injection in good condition. The patient reported complete resolution of left lower extremity pain at the time of discharge. COMPLICATIONS: None IMPRESSION: Technically successful injection consisting of a left L5 nerve root block and transforaminal epidural. Electronically Signed   By: Logan Bores M.D.   On: 07/12/2016 08:23    Assessment & Plan:   Problem List Items Addressed This Visit      Unprioritized   Diabetes mellitus with neuropathy ([place]) - Primary    Currently well-controlled on current medications .  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 Patient is reminded to schedule an annual eye exam and foot exam is normal today. Patient has no microalbuminuria. Patient is tolerating statin therapy for CAD risk reduction and on ACE/ARB for renal protection and hypertension   Lab Results  Component Value Date   HGBA1C 7.0 (H) 02/19/2020   Lab Results  Component Value Date   LABMICR See below: 09/24/2019   LABMICR See below: 05/06/2019   MICROALBUR 1.8 02/19/2020   MICROALBUR 1.4 02/27/2019    '       Relevant Orders   Hemoglobin A1c (Completed)   Microalbumin / creatinine urine ratio (Completed)   Comprehensive metabolic panel (Completed)   Lipid panel (Completed)   Hyperlipidemia    Not taking pravastatin .  Advised to resume statin therapy  Lab Results  Component Value  Date   CHOL 195 02/19/2020   HDL 47.80 02/19/2020   LDLCALC 99 02/27/2019   LDLDIRECT 122.0 02/19/2020   TRIG 203.0 (H) 02/19/2020   CHOLHDL 4 02/19/2020         Hypertension    Not at goal on maximal 4 drug combination of metoprolol 25 mg bid,  amlodipine 10 mg , hctz 25 mg and losartan 100 mg . He may  not be adherent to regimen based on review of medication refill history.  Advised to check home regimen for use of amlodipine .      Lab Results  Component Value Date   CREATININE 0.99 02/19/2020   Lab Results  Component Value Date   NA 136 02/19/2020   K 4.2 02/19/2020   CL 103 02/19/2020   CO2 25 02/19/2020          Other Visit Diagnoses    Screening for HIV (human immunodeficiency virus)       Relevant Medications   sulfamethoxazole-trimethoprim (BACTRIM DS) 800-160 MG tablet   Other Relevant Orders   HIV Antibody (routine testing w rflx)   Need for immunization against influenza       Relevant Orders   Flu Vaccine QUAD 36+ mos IM (Completed)      I have discontinued Laquon Casto Jr.'s glucosamine-chondroitin, INS SYRINGE/NEEDLE 1CC/28G, SYRINGE 3CC/20GX1", fluticasone, Osteo Bi-Flex Adv Triple St, Stimulant Laxative, oxyCODONE, and pantoprazole. I am also having him start on sulfamethoxazole-trimethoprim. Additionally, I am having him maintain his vitamin C, aspirin, traMADol, hydrochlorothiazide, blood glucose meter kit and supplies, Fifty50 Glucose Meter 2.0, Cholecalciferol, econazole nitrate, tamsulosin, amLODipine, meloxicam, metFORMIN, metoprolol tartrate, glipiZIDE, and losartan.  Meds ordered this encounter  Medications  . sulfamethoxazole-trimethoprim (BACTRIM DS) 800-160 MG tablet    Sig: Take 1 tablet by mouth 2 (two) times daily.    Dispense:  14 tablet    Refill:  0    Do not fill now.  KEEP ON FILE FOR FUTURE REFILL for skin infection    Medications Discontinued During This Encounter  Medication Reason  . glucosamine-chondroitin 500-400 MG  tablet Patient Preference  . INS SYRINGE/NEEDLE 1CC/28G (B-D INSULIN SYRINGE 1CC/28G) 28G X 1/2" 1 ML MISC Patient Preference  . Misc Natural Products (OSTEO BI-FLEX ADV TRIPLE ST) TABS Patient Preference  . pantoprazole (PROTONIX) 40 MG tablet Patient Preference  . STIMULANT LAXATIVE 8.6-50 MG tablet Patient Preference  . Syringe/Needle, Disp, (SYRINGE 3CC/20GX1") 20G X 1" 3 ML MISC Patient Preference  . fluticasone (FLONASE) 50 MCG/ACT nasal spray   . oxyCODONE (OXY IR/ROXICODONE) 5 MG immediate release tablet     Follow-up: Return in about 1 week (around 02/26/2020).   Crecencio Mc, MD

## 2020-02-19 NOTE — Patient Instructions (Addendum)
Your blood pressure is too high   Confirm that you are taking  4 for blood pressure:  Amlodipine ,losartan metoprolol and hctz   Check BP daily for the next 5 days and send me the readings   You have a small sebaceous cyst on your thigh . It is not infected currently   I sent antibiotic to start if the lump on thigh  becomes tender and/or red

## 2020-02-20 MED ORDER — ATORVASTATIN CALCIUM 20 MG PO TABS: 20.0000 mg | ORAL_TABLET | Freq: Every day | ORAL | 3 refills | Status: AC

## 2020-02-20 NOTE — Assessment & Plan Note (Signed)
Currently well-controlled on current medications .  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 Patient is reminded to schedule an annual eye exam and foot exam is normal today. Patient has no microalbuminuria. Patient is tolerating statin therapy for CAD risk reduction and on ACE/ARB for renal protection and hypertension   Lab Results  Component Value Date   HGBA1C 7.0 (H) 02/19/2020   Lab Results  Component Value Date   LABMICR See below: 09/24/2019   LABMICR See below: 05/06/2019   MICROALBUR 1.8 02/19/2020   MICROALBUR 1.4 02/27/2019    '

## 2020-02-20 NOTE — Assessment & Plan Note (Addendum)
Not at goal on maximal 4 drug combination of metoprolol 25 mg bid,  amlodipine 10 mg , hctz 25 mg and losartan 100 mg . He may not be adherent to regimen based on review of medication refill history.  Advised to check home regimen for use of amlodipine .      Lab Results  Component Value Date   CREATININE 0.99 02/19/2020   Lab Results  Component Value Date   NA 136 02/19/2020   K 4.2 02/19/2020   CL 103 02/19/2020   CO2 25 02/19/2020

## 2020-02-20 NOTE — Assessment & Plan Note (Signed)
Not taking pravastatin .  Advised to resume statin therapy  Lab Results  Component Value Date   CHOL 195 02/19/2020   HDL 47.80 02/19/2020   LDLCALC 99 02/27/2019   LDLDIRECT 122.0 02/19/2020   TRIG 203.0 (H) 02/19/2020   CHOLHDL 4 02/19/2020

## 2020-02-21 ENCOUNTER — Encounter: Payer: Self-pay | Admitting: Internal Medicine

## 2020-02-25 ENCOUNTER — Telehealth: Payer: Self-pay | Admitting: Internal Medicine

## 2020-02-25 ENCOUNTER — Other Ambulatory Visit: Payer: Self-pay | Admitting: Internal Medicine

## 2020-02-25 DIAGNOSIS — D2261 Melanocytic nevi of right upper limb, including shoulder: Secondary | ICD-10-CM | POA: Diagnosis not present

## 2020-02-25 DIAGNOSIS — D485 Neoplasm of uncertain behavior of skin: Secondary | ICD-10-CM | POA: Diagnosis not present

## 2020-02-25 DIAGNOSIS — D2262 Melanocytic nevi of left upper limb, including shoulder: Secondary | ICD-10-CM | POA: Diagnosis not present

## 2020-02-25 DIAGNOSIS — L570 Actinic keratosis: Secondary | ICD-10-CM | POA: Diagnosis not present

## 2020-02-25 DIAGNOSIS — C44612 Basal cell carcinoma of skin of right upper limb, including shoulder: Secondary | ICD-10-CM | POA: Diagnosis not present

## 2020-02-25 DIAGNOSIS — Z85828 Personal history of other malignant neoplasm of skin: Secondary | ICD-10-CM | POA: Diagnosis not present

## 2020-02-25 DIAGNOSIS — D2271 Melanocytic nevi of right lower limb, including hip: Secondary | ICD-10-CM | POA: Diagnosis not present

## 2020-02-25 DIAGNOSIS — X32XXXA Exposure to sunlight, initial encounter: Secondary | ICD-10-CM | POA: Diagnosis not present

## 2020-02-25 MED ORDER — AMLODIPINE BESYLATE 10 MG PO TABS: ORAL_TABLET | ORAL | 1 refills | Status: AC

## 2020-02-25 NOTE — Telephone Encounter (Signed)
Pt called and wanted to know if he needed to continue the amLODipine (NORVASC) 10 MG tablet

## 2020-02-25 NOTE — Telephone Encounter (Signed)
Yes.   he was supposed to confirm whether he has been taking it since BP was high at visit.  Refill for 90 days if needed.

## 2020-02-25 NOTE — Telephone Encounter (Signed)
Spoke with pt to let him know that he is supposed to be taking the amlodipine and that we have sent in a refill for 90 days to his pharmacy. Pt gave a verbal understanding.

## 2020-02-26 ENCOUNTER — Other Ambulatory Visit: Payer: Self-pay

## 2020-02-26 ENCOUNTER — Ambulatory Visit (INDEPENDENT_AMBULATORY_CARE_PROVIDER_SITE_OTHER): Payer: BC Managed Care – PPO | Admitting: *Deleted

## 2020-02-26 DIAGNOSIS — Z23 Encounter for immunization: Secondary | ICD-10-CM

## 2020-02-26 NOTE — Progress Notes (Signed)
Shingrix given in left deltoid patient responded well with no concerns voiced VIS statement given.

## 2020-03-23 DIAGNOSIS — C44612 Basal cell carcinoma of skin of right upper limb, including shoulder: Secondary | ICD-10-CM | POA: Diagnosis not present

## 2020-03-26 DIAGNOSIS — D171 Benign lipomatous neoplasm of skin and subcutaneous tissue of trunk: Secondary | ICD-10-CM

## 2020-03-30 NOTE — Addendum Note (Signed)
Addended by: Crecencio Mc on: 03/30/2020 12:08 AM   Modules accepted: Orders

## 2020-03-31 ENCOUNTER — Ambulatory Visit (INDEPENDENT_AMBULATORY_CARE_PROVIDER_SITE_OTHER): Payer: BC Managed Care – PPO

## 2020-03-31 ENCOUNTER — Other Ambulatory Visit: Payer: Self-pay

## 2020-03-31 DIAGNOSIS — Z23 Encounter for immunization: Secondary | ICD-10-CM

## 2020-03-31 NOTE — Progress Notes (Signed)
Patient presented for Shingrix injection to left deltoid, patient voiced no concerns nor showed any signs of distress during injection.

## 2020-03-31 NOTE — Progress Notes (Signed)
The second dose of Shingrix was inadvertently given at 1 month after initial dose. Contacted Personal assistant with [facility] and was told that immunocompromised or a patient that could immunocompromised in the future can be given Shingrix at 0, then 1-2 months instead of 2-6 months, for better immunity coverage.  Provider made aware. Contacted patient and made them aware.

## 2020-05-15 ENCOUNTER — Other Ambulatory Visit: Payer: Self-pay

## 2020-05-18 ENCOUNTER — Other Ambulatory Visit: Payer: Self-pay

## 2020-05-18 ENCOUNTER — Other Ambulatory Visit: Payer: BC Managed Care – PPO

## 2020-05-18 DIAGNOSIS — G4733 Obstructive sleep apnea (adult) (pediatric): Secondary | ICD-10-CM | POA: Diagnosis not present

## 2020-05-18 DIAGNOSIS — N401 Enlarged prostate with lower urinary tract symptoms: Secondary | ICD-10-CM

## 2020-05-18 DIAGNOSIS — R350 Frequency of micturition: Secondary | ICD-10-CM | POA: Diagnosis not present

## 2020-05-19 LAB — PSA: Prostate Specific Ag, Serum: 1.2 ng/mL (ref 0.0–4.0)

## 2020-05-20 ENCOUNTER — Encounter: Payer: Self-pay | Admitting: Urology

## 2020-05-20 ENCOUNTER — Other Ambulatory Visit: Payer: Self-pay

## 2020-05-20 ENCOUNTER — Ambulatory Visit: Payer: BC Managed Care – PPO | Admitting: Urology

## 2020-05-20 VITALS — BP 183/84 | HR 88 | Ht 72.0 in | Wt 310.0 lb

## 2020-05-20 DIAGNOSIS — Z125 Encounter for screening for malignant neoplasm of prostate: Secondary | ICD-10-CM | POA: Diagnosis not present

## 2020-05-20 DIAGNOSIS — N401 Enlarged prostate with lower urinary tract symptoms: Secondary | ICD-10-CM

## 2020-05-20 DIAGNOSIS — R350 Frequency of micturition: Secondary | ICD-10-CM | POA: Diagnosis not present

## 2020-05-20 MED ORDER — TAMSULOSIN HCL 0.4 MG PO CAPS: 0.4000 mg | ORAL_CAPSULE | Freq: Every day | ORAL | 11 refills | Status: DC

## 2020-05-20 NOTE — Patient Instructions (Signed)

## 2020-05-20 NOTE — Progress Notes (Signed)
   05/20/2020 10:46 AM   [name]. Jun 30, 1955 [id number]  Reason for visit: Follow up BPH, recurrent UTIs, nocturia, PSA screening  HPI: 65 year old male with morbid obesity and BMI of 42 and comorbidities including diabetes, hypertension, and sleep apnea compliant with CPAP.  He has a history of UTI after knee surgery, but has done well since that time.  He has mild BPH symptoms of weak stream and nocturia, and he has been stable on Flomax.  He denies any gross hematuria or recent infections.  PVRs have been normal.  Regarding nocturia, he finds that the more he drinks in the evenings the worse his nocturia is, and he tries to limit fluids after 7 PM.  PSA today is normal at 1.2.  We reviewed the AUA guidelines regarding screening, and I recommended continuing PSA every 1 to 2 years.  Continue Flomax RTC 1 year with PSA, IPSS, PVR    [name], MD  [facility] [address], [place], [place] [address] [phone]

## 2020-06-09 ENCOUNTER — Other Ambulatory Visit: Payer: Self-pay | Admitting: Urology

## 2020-06-09 DIAGNOSIS — R350 Frequency of micturition: Secondary | ICD-10-CM

## 2020-06-09 DIAGNOSIS — N401 Enlarged prostate with lower urinary tract symptoms: Secondary | ICD-10-CM

## 2020-06-19 ENCOUNTER — Other Ambulatory Visit: Payer: Self-pay | Admitting: Internal Medicine

## 2020-06-19 ENCOUNTER — Telehealth: Payer: BC Managed Care – PPO | Admitting: Family Medicine

## 2020-06-19 ENCOUNTER — Telehealth: Payer: Self-pay | Admitting: Internal Medicine

## 2020-06-19 ENCOUNTER — Telehealth: Payer: BC Managed Care – PPO | Admitting: Physician Assistant

## 2020-06-19 DIAGNOSIS — J019 Acute sinusitis, unspecified: Secondary | ICD-10-CM | POA: Diagnosis not present

## 2020-06-19 DIAGNOSIS — B9689 Other specified bacterial agents as the cause of diseases classified elsewhere: Secondary | ICD-10-CM

## 2020-06-19 MED ORDER — AMOXICILLIN-POT CLAVULANATE 875-125 MG PO TABS
1.0000 | ORAL_TABLET | Freq: Two times a day (BID) | ORAL | 0 refills | Status: DC
Start: 2020-06-19 — End: 2020-07-24

## 2020-06-19 NOTE — Progress Notes (Signed)
We are sorry that you are not feeling well.  Here is how we plan to help!  Based on what you have shared with me it looks like you have sinusitis.  Sinusitis is inflammation and infection in the sinus cavities of the head.  Based on your presentation I believe you most likely have Acute Bacterial Sinusitis.  This is an infection caused by bacteria and is treated with antibiotics. I have prescribed Augmentin 875mg/125mg one tablet twice daily with food, for 7 days. You may use an oral decongestant such as Mucinex D or if you have glaucoma or high blood pressure use plain Mucinex. Saline nasal spray help and can safely be used as often as needed for congestion.  If you develop worsening sinus pain, fever or notice severe headache and vision changes, or if symptoms are not better after completion of antibiotic, please schedule an appointment with a health care provider.    Sinus infections are not as easily transmitted as other respiratory infection, however we still recommend that you avoid close contact with loved ones, especially the very young and elderly.  Remember to wash your hands thoroughly throughout the day as this is the number one way to prevent the spread of infection!  Home Care:  Only take medications as instructed by your medical team.  Complete the entire course of an antibiotic.  Do not take these medications with alcohol.  A steam or ultrasonic humidifier can help congestion.  You can place a towel over your head and breathe in the steam from hot water coming from a faucet.  Avoid close contacts especially the very young and the elderly.  Cover your mouth when you cough or sneeze.  Always remember to wash your hands.  Get Help Right Away If:  You develop worsening fever or sinus pain.  You develop a severe head ache or visual changes.  Your symptoms persist after you have completed your treatment plan.  Make sure you  Understand these instructions.  Will watch your  condition.  Will get help right away if you are not doing well or get worse.  Your e-visit answers were reviewed by a board certified advanced clinical practitioner to complete your personal care plan.  Depending on the condition, your plan could have included both over the counter or prescription medications.  If there is a problem please reply  once you have received a response from your provider.  Your safety is important to us.  If you have drug allergies check your prescription carefully.    You can use MyChart to ask questions about today's visit, request a non-urgent call back, or ask for a work or school excuse for 24 hours related to this e-Visit. If it has been greater than 24 hours you will need to follow up with your provider, or enter a new e-Visit to address those concerns.  You will get an e-mail in the next two days asking about your experience.  I hope that your e-visit has been valuable and will speed your recovery. Thank you for using e-visits.  I provided 5 minutes of non face-to-face time during this encounter for chart review and documentation.   

## 2020-06-19 NOTE — Telephone Encounter (Signed)
APPT scheduled today with Dr. Caryl Bis   Pt stated that he has had congestion, sore throat and a low grade fever since Sunday

## 2020-07-01 NOTE — Telephone Encounter (Signed)
Please advise 

## 2020-07-02 ENCOUNTER — Telehealth: Payer: BC Managed Care – PPO | Admitting: Adult Health

## 2020-07-20 DIAGNOSIS — H2513 Age-related nuclear cataract, bilateral: Secondary | ICD-10-CM | POA: Diagnosis not present

## 2020-07-20 DIAGNOSIS — S0501XA Injury of conjunctiva and corneal abrasion without foreign body, right eye, initial encounter: Secondary | ICD-10-CM | POA: Diagnosis not present

## 2020-07-23 NOTE — Progress Notes (Signed)
07/24/2020 11:39 AM   David Pineda. 1955/06/07 [id number]  Referring provider: Crecencio Mc, MD [address] [place],  [place] [address]  Urological history: 1. rUTI's -contributing factors of age and DM -documented positive urine cultures over the last year  None  2. BPH with LU TS -PSA 1.2 05/2020 -PVR 27 mL -managed with tamsulosin 0.4 mg daily   Chief Complaint  Patient presents with   Urinary Tract Infection    HPI: David Pinedais a 65 y.o. male who presents today for pain with urination.  He is experiencing frequency, urgency, dysuria and a weak urinary stream that started 5 days ago.    He restarted the tamsulosin 0.4 mg two tablets daily and that has helped with the weak stream.    Patient denies any modifying or aggravating factors.  Patient denies any gross hematuria, dysuria or suprapubic/flank pain.  Patient denies any fevers, chills, nausea or vomiting.    UA nitrite positive, > 30 WBC's, > 30 RBC's and many bacteria.    PVR 27 mL    PMH: Past Medical History:  Diagnosis Date   Arthritis    COVID-19 virus infection 10/29/2018   Diabetes mellitus without complication (HCC)    Elevated blood pressure reading    Hyperlipidemia    Hypertension    Hypotestosteronism    Obesity, morbid, BMI 40.0-49.9 (HCC)    OSA on CPAP     Surgical History: Past Surgical History:  Procedure Laterality Date   FOOT SURGERY     left     Home Medications:  Allergies as of 07/24/2020   No Known Allergies      Medication List        Accurate as of July 24, 2020 11:39 AM. If you have any questions, ask your nurse or doctor.          STOP taking these medications    amoxicillin-clavulanate 875-125 MG tablet Commonly known as: AUGMENTIN Stopped by: David Trevizo, PA-C       TAKE these medications    amLODipine 10 MG tablet Commonly known as: NORVASC TAKE 1 TABLET(10 MG) BY MOUTH DAILY   aspirin 325 MG  tablet Take 325 mg by mouth daily.   atorvastatin 20 MG tablet Commonly known as: LIPITOR Take 1 tablet (20 mg total) by mouth daily.   Cholecalciferol 50 MCG (2000 UT) Caps Take by mouth.   glipiZIDE 5 MG tablet Commonly known as: GLUCOTROL TAKE 1 TABLET(5 MG) BY MOUTH TWICE DAILY BEFORE A MEAL   hydrochlorothiazide 25 MG tablet Commonly known as: HYDRODIURIL Take 1 tablet (25 mg total) by mouth daily.   losartan 100 MG tablet Commonly known as: COZAAR TAKE 1 TABLET(100 MG) BY MOUTH DAILY   meloxicam 15 MG tablet Commonly known as: MOBIC TAKE 1 TABLET(15 MG) BY MOUTH DAILY   metFORMIN 1000 MG tablet Commonly known as: GLUCOPHAGE TAKE 1 TABLET(1000 MG) BY MOUTH TWICE DAILY WITH A MEAL   metoprolol tartrate 25 MG tablet Commonly known as: LOPRESSOR Take 1 tablet (25 mg total) by mouth 2 (two) times daily.   sulfamethoxazole-trimethoprim 800-160 MG tablet Commonly known as: BACTRIM DS Take 1 tablet by mouth every 12 (twelve) hours. Started by: David Council, PA-C   tamsulosin 0.4 MG Caps capsule Commonly known as: FLOMAX Take 2 capsules (0.8 mg total) by mouth daily. What changed: See the new instructions. Changed by: David Council, PA-C   traMADol 50 MG tablet Commonly known as: ULTRAM Take 1  tablet (50 mg total) by mouth every 6 (six) hours as needed. for pain   vitamin C 100 MG tablet Take 100 mg by mouth daily.        Allergies: No Known Allergies  Family History: Family History  Adopted: Yes  Problem Relation Age of Onset   Diabetes Mother    Hyperlipidemia Father    Hypertension Father     Social History:  reports that he quit smoking about 25 years ago. His smoking use included cigarettes. He has a 21.00 pack-year smoking history. He has quit using smokeless tobacco. He reports current alcohol use of about 14.0 standard drinks of alcohol per week. He reports that he does not use drugs.  ROS: Pertinent ROS in HPI  Physical Exam: BP  131/78   Pulse (!) 108   Ht 6' (1.829 m)   Wt 300 lb (136.1 kg)   BMI 40.69 kg/m   Constitutional:  Well nourished. Alert and oriented, No acute distress. HEENT: [place] AT, mask in place.  Trachea midline Cardiovascular: No clubbing, cyanosis, or edema. Respiratory: Normal respiratory effort, no increased work of breathing. Neurologic: Grossly intact, no focal deficits, moving all 4 extremities. Psychiatric: Normal mood and affect.  Laboratory Data: Lab Results  Component Value Date   CREATININE 0.99 02/19/2020    Lab Results  Component Value Date   PSA 1.76 04/16/2019   PSA 5.87 (H) 02/27/2019   PSA 1.8 09/22/2017    Lab Results  Component Value Date   HGBA1C 7.0 (H) 02/19/2020       Component Value Date/Time   CHOL 195 02/19/2020 1203   CHOL 136 09/22/2015 1449   HDL 47.80 02/19/2020 1203   HDL 36 (L) 09/22/2015 1449   CHOLHDL 4 02/19/2020 1203   VLDL 40.6 (H) 02/19/2020 1203   LDLCALC 99 02/27/2019 1137   LDLCALC 73 09/22/2015 1449    Lab Results  Component Value Date   AST 36 02/19/2020   Lab Results  Component Value Date   ALT 59 (H) 02/19/2020    Urinalysis Component     Latest Ref Rng & Units 07/24/2020  Specific Gravity, UA     1.005 - 1.030 1.015  pH, UA     5.0 - 7.5 5.5  Color, UA     Yellow Yellow  Appearance Ur     Clear Cloudy (A)  Leukocytes,UA     Negative 1+ (A)  Protein,UA     Negative/Trace 2+ (A)  Glucose, UA     Negative 3+ (A)  Ketones, UA     Negative Trace (A)  RBC, UA     Negative 2+ (A)  Bilirubin, UA     Negative Negative  Urobilinogen, Ur     0.2 - 1.0 mg/dL 1.0  Nitrite, UA     Negative Positive (A)  Microscopic Examination      See below:   Component     Latest Ref Rng & Units 07/24/2020  WBC, UA     0 - 5 /hpf >30 (A)  RBC     0 - 2 /hpf >30 (A)  Epithelial Cells (non renal)     0 - 10 /hpf 0-10  Casts     None seen /lpf Present (A)  Cast Type     N/A Hyaline casts  Bacteria, UA     None seen/Few  Many (A)  I have reviewed the labs.   Pertinent Imaging: Results for David Pineda (MRN [id number]) as of 07/24/2020  11:24  Ref. Range 07/24/2020 11:16  Scan Result Unknown 67ml   Assessment & Plan:    1. Dysuria  -UA grossly infected -Urine sent for culture -started Septra DS, twice daily for seven days -will adjust if necessary once culture and sensitivities return  2. Microscopic hematuria -recheck UA in one month  3. BPH with LU TS -increase tamsulosin from 0.4 mg daily to 0.8 mg daily  -script sent to pharmacy to reflect the change  Return in about 1 month (around 08/24/2020) for UA, I PSS.  These notes generated with voice recognition software. I apologize for typographical errors.  David Council, PA-C  [facility] [address]  [place] [address] [place], [place] [address] [phone]

## 2020-07-24 ENCOUNTER — Ambulatory Visit: Payer: BC Managed Care – PPO | Admitting: Urology

## 2020-07-24 ENCOUNTER — Other Ambulatory Visit: Payer: Self-pay

## 2020-07-24 ENCOUNTER — Encounter: Payer: Self-pay | Admitting: Urology

## 2020-07-24 VITALS — BP 131/78 | HR 108 | Ht 72.0 in | Wt 300.0 lb

## 2020-07-24 DIAGNOSIS — R3129 Other microscopic hematuria: Secondary | ICD-10-CM | POA: Diagnosis not present

## 2020-07-24 DIAGNOSIS — R300 Dysuria: Secondary | ICD-10-CM

## 2020-07-24 DIAGNOSIS — R350 Frequency of micturition: Secondary | ICD-10-CM

## 2020-07-24 DIAGNOSIS — N401 Enlarged prostate with lower urinary tract symptoms: Secondary | ICD-10-CM

## 2020-07-24 LAB — MICROSCOPIC EXAMINATION
RBC, Urine: >30 /hpf — AB (ref 0–2)
WBC, UA: >30 /hpf — AB (ref 0–5)

## 2020-07-24 LAB — URINALYSIS, COMPLETE
Bilirubin, UA: NEGATIVE
Nitrite, UA: POSITIVE — AB
Specific Gravity, UA: 1.015 (ref 1.005–1.030)
Urobilinogen, Ur: 1 mg/dL (ref 0.2–1.0)
pH, UA: 5.5 (ref 5.0–7.5)

## 2020-07-24 LAB — BLADDER SCAN AMB NON-IMAGING

## 2020-07-24 MED ORDER — SULFAMETHOXAZOLE-TRIMETHOPRIM 800-160 MG PO TABS: 1.0000 | ORAL_TABLET | Freq: Two times a day (BID) | ORAL | 0 refills | Status: DC

## 2020-07-24 MED ORDER — TAMSULOSIN HCL 0.4 MG PO CAPS: 0.8000 mg | ORAL_CAPSULE | Freq: Every day | ORAL | 3 refills | Status: DC

## 2020-07-27 LAB — CULTURE, URINE COMPREHENSIVE

## 2020-08-03 DIAGNOSIS — D171 Benign lipomatous neoplasm of skin and subcutaneous tissue of trunk: Secondary | ICD-10-CM | POA: Diagnosis not present

## 2020-08-09 ENCOUNTER — Other Ambulatory Visit: Payer: Self-pay | Admitting: Internal Medicine

## 2020-08-17 DIAGNOSIS — G4733 Obstructive sleep apnea (adult) (pediatric): Secondary | ICD-10-CM | POA: Diagnosis not present

## 2020-09-03 DIAGNOSIS — X32XXXA Exposure to sunlight, initial encounter: Secondary | ICD-10-CM | POA: Diagnosis not present

## 2020-09-03 DIAGNOSIS — L570 Actinic keratosis: Secondary | ICD-10-CM | POA: Diagnosis not present

## 2020-09-03 DIAGNOSIS — L821 Other seborrheic keratosis: Secondary | ICD-10-CM | POA: Diagnosis not present

## 2020-09-03 DIAGNOSIS — D225 Melanocytic nevi of trunk: Secondary | ICD-10-CM | POA: Diagnosis not present

## 2020-09-03 DIAGNOSIS — D485 Neoplasm of uncertain behavior of skin: Secondary | ICD-10-CM | POA: Diagnosis not present

## 2020-09-03 DIAGNOSIS — D2261 Melanocytic nevi of right upper limb, including shoulder: Secondary | ICD-10-CM | POA: Diagnosis not present

## 2020-09-03 DIAGNOSIS — C44519 Basal cell carcinoma of skin of other part of trunk: Secondary | ICD-10-CM | POA: Diagnosis not present

## 2020-09-03 DIAGNOSIS — D2262 Melanocytic nevi of left upper limb, including shoulder: Secondary | ICD-10-CM | POA: Diagnosis not present

## 2020-09-03 DIAGNOSIS — Z85828 Personal history of other malignant neoplasm of skin: Secondary | ICD-10-CM | POA: Diagnosis not present

## 2020-09-03 NOTE — Progress Notes (Deleted)
09/04/2020 7:47 AM   [name]. 13-Jul-1955 [id number]  Referring provider: Crecencio Mc, MD [address] [place],  [place] [address]  Urological history: 1. rUTI's -contributing factors of age and DM -documented positive urine cultures over the last year  07/24/2020 E.coli resistant to ampicillin  2. BPH with LU TS -PSA 1.2 05/2020 -I PSS *** -PVR 27 mL -managed with tamsulosin 0.4 mg, 2 daily   No chief complaint on file.   HPI: David Pinedais a 65 y.o. male who presents today for one month follow up for UA and I PSS.    Score:  1-7 Mild 8-19 Moderate 20-35 Severe    PMH: Past Medical History:  Diagnosis Date   Arthritis    COVID-19 virus infection 10/29/2018   Diabetes mellitus without complication (HCC)    Elevated blood pressure reading    Hyperlipidemia    Hypertension    Hypotestosteronism    Obesity, morbid, BMI 40.0-49.9 (HCC)    OSA on CPAP     Surgical History: Past Surgical History:  Procedure Laterality Date   FOOT SURGERY     left     Home Medications:  Allergies as of 09/04/2020   No Known Allergies      Medication List        Accurate as of September 03, 2020  7:47 AM. If you have any questions, ask your nurse or doctor.          amLODipine 10 MG tablet Commonly known as: NORVASC TAKE 1 TABLET(10 MG) BY MOUTH DAILY   aspirin 325 MG tablet Take 325 mg by mouth daily.   atorvastatin 20 MG tablet Commonly known as: LIPITOR Take 1 tablet (20 mg total) by mouth daily.   Cholecalciferol 50 MCG (2000 UT) Caps Take by mouth.   glipiZIDE 5 MG tablet Commonly known as: GLUCOTROL TAKE 1 TABLET(5 MG) BY MOUTH TWICE DAILY BEFORE A MEAL   hydrochlorothiazide 25 MG tablet Commonly known as: HYDRODIURIL Take 1 tablet (25 mg total) by mouth daily.   losartan 100 MG tablet Commonly known as: COZAAR TAKE 1 TABLET(100 MG) BY MOUTH DAILY   meloxicam 15 MG tablet Commonly known as: MOBIC TAKE 1  TABLET(15 MG) BY MOUTH DAILY   metFORMIN 1000 MG tablet Commonly known as: GLUCOPHAGE TAKE 1 TABLET(1000 MG) BY MOUTH TWICE DAILY WITH A MEAL   metoprolol tartrate 25 MG tablet Commonly known as: LOPRESSOR TAKE 1 TABLET(25 MG) BY MOUTH TWICE DAILY   sulfamethoxazole-trimethoprim 800-160 MG tablet Commonly known as: BACTRIM DS Take 1 tablet by mouth every 12 (twelve) hours.   tamsulosin 0.4 MG Caps capsule Commonly known as: FLOMAX Take 2 capsules (0.8 mg total) by mouth daily.   traMADol 50 MG tablet Commonly known as: ULTRAM Take 1 tablet (50 mg total) by mouth every 6 (six) hours as needed. for pain   vitamin C 100 MG tablet Take 100 mg by mouth daily.        Allergies: No Known Allergies  Family History: Family History  Adopted: Yes  Problem Relation Age of Onset   Diabetes Mother    Hyperlipidemia Father    Hypertension Father     Social History:  reports that he quit smoking about 25 years ago. His smoking use included cigarettes. He has a 21.00 pack-year smoking history. He has quit using smokeless tobacco. He reports current alcohol use of about 14.0 standard drinks per week. He reports that he does not use drugs.  ROS: Pertinent ROS in HPI  Physical Exam: There were no vitals taken for this visit.  Constitutional:  Well nourished. Alert and oriented, No acute distress. HEENT: [place] AT, moist mucus membranes.  Trachea midline Cardiovascular: No clubbing, cyanosis, or edema. Respiratory: Normal respiratory effort, no increased work of breathing. GI: Abdomen is soft, non tender, non distended, no abdominal masses. Liver and spleen not palpable.  No hernias appreciated.  Stool sample for occult testing is not indicated.   GU: No CVA tenderness.  No bladder fullness or masses.  Patient with circumcised/uncircumcised phallus. ***Foreskin easily retracted***  Urethral meatus is patent.  No penile discharge. No penile lesions or rashes. Scrotum without lesions, cysts,  rashes and/or edema.  Testicles are located scrotally bilaterally. No masses are appreciated in the testicles. Left and right epididymis are normal. Rectal: Patient with  normal sphincter tone. Anus and perineum without scarring or rashes. No rectal masses are appreciated. Prostate is approximately *** grams, *** nodules are appreciated. Seminal vesicles are normal. Skin: No rashes, bruises or suspicious lesions. Lymph: No inguinal adenopathy. Neurologic: Grossly intact, no focal deficits, moving all 4 extremities. Psychiatric: Normal mood and affect.   Laboratory Data: Urinalysis *** I have reviewed the labs.   Pertinent Imaging: N/A  Assessment & Plan:    1. Dysuria  -UA grossly infected -Urine sent for culture -started Septra DS, twice daily for seven days -will adjust if necessary once culture and sensitivities return  2. Microscopic hematuria -recheck UA in one month  3. BPH with LU TS -increase tamsulosin from 0.4 mg daily to 0.8 mg daily  -script sent to pharmacy to reflect the change  No follow-ups on file.  These notes generated with voice recognition software. I apologize for typographical errors.  Zara Council, PA-C  [facility] [address]  [place], [place] [address] [phone]

## 2020-09-04 ENCOUNTER — Ambulatory Visit: Payer: Self-pay | Admitting: Urology

## 2020-09-04 DIAGNOSIS — R3129 Other microscopic hematuria: Secondary | ICD-10-CM

## 2020-09-04 DIAGNOSIS — N401 Enlarged prostate with lower urinary tract symptoms: Secondary | ICD-10-CM

## 2020-09-04 DIAGNOSIS — R300 Dysuria: Secondary | ICD-10-CM

## 2020-09-08 ENCOUNTER — Ambulatory Visit: Payer: BC Managed Care – PPO | Admitting: Urology

## 2020-09-08 DIAGNOSIS — H40012 Open angle with borderline findings, low risk, left eye: Secondary | ICD-10-CM | POA: Diagnosis not present

## 2020-09-08 DIAGNOSIS — E119 Type 2 diabetes mellitus without complications: Secondary | ICD-10-CM | POA: Diagnosis not present

## 2020-09-08 DIAGNOSIS — H35372 Puckering of macula, left eye: Secondary | ICD-10-CM | POA: Diagnosis not present

## 2020-09-08 DIAGNOSIS — H2513 Age-related nuclear cataract, bilateral: Secondary | ICD-10-CM | POA: Diagnosis not present

## 2020-09-08 DIAGNOSIS — Z7984 Long term (current) use of oral hypoglycemic drugs: Secondary | ICD-10-CM | POA: Diagnosis not present

## 2020-09-08 LAB — HM DIABETES EYE EXAM

## 2020-09-14 NOTE — Progress Notes (Signed)
09/15/2020 4:19 PM   [name]. 07-26-1955 [id number]  Referring provider: Crecencio Mc, MD [address] [place],   [address]  Urological history: 1. rUTI's -contributing factors of age and DM -documented positive urine cultures over the last year  07/24/2020 E.coli resistant to ampicillin  2. BPH with LU TS -PSA 1.2 05/2020 -I PSS 8/4 -PVR 27 mL -managed with tamsulosin 0.4 mg, 2 daily   Chief Complaint  Patient presents with   Hematuria     HPI: David Pinedais a 65 y.o. male who presents today for one month follow up for UA and I PSS.  He has been experiencing mild left scrotal swelling along with the returning of frequency, urgency, dysuria and a weak urinary stream.  Patient denies any modifying or aggravating factors.  Patient denies any gross hematuria or suprapubic/flank pain.  Patient denies any fevers, chills, nausea or vomiting.    UA nitrite positive, > 30 WBC's and many bacteria.    IPSS     Row Name 09/15/20 1100         International Prostate Symptom Score   How often have you had the sensation of not emptying your bladder? Less than 1 in 5     How often have you had to urinate less than every two hours? Less than half the time     How often have you found you stopped and started again several times when you urinated? Less than half the time     How often have you found it difficult to postpone urination? Less than 1 in 5 times     How often have you had a weak urinary stream? Less than 1 in 5 times     How often have you had to strain to start urination? Not at All     How many times did you typically get up at night to urinate? 1 Time     Total IPSS Score 8           Quality of Life due to urinary symptoms   If you were to spend the rest of your life with your urinary condition just the way it is now how would you feel about that? Mostly Disatisfied              Score:  1-7 Mild 8-19  Moderate 20-35 Severe    PMH: Past Medical History:  Diagnosis Date   Arthritis    COVID-19 virus infection 10/29/2018   Diabetes mellitus without complication (HCC)    Elevated blood pressure reading    Hyperlipidemia    Hypertension    Hypotestosteronism    Obesity, morbid, BMI 40.0-49.9 (HCC)    OSA on CPAP     Surgical History: Past Surgical History:  Procedure Laterality Date   FOOT SURGERY     left     Home Medications:  Allergies as of 09/15/2020   No Known Allergies      Medication List        Accurate as of September 15, 2020 11:59 PM. If you have any questions, ask your nurse or doctor.          STOP taking these medications    sulfamethoxazole-trimethoprim 800-160 MG tablet Commonly known as: BACTRIM DS Stopped by: Laporcha Marchesi, PA-C       TAKE these medications    amLODipine 10 MG tablet Commonly known as: NORVASC TAKE 1 TABLET(10 MG) BY MOUTH DAILY   aspirin  325 MG tablet Take 325 mg by mouth daily.   atorvastatin 20 MG tablet Commonly known as: LIPITOR Take 1 tablet (20 mg total) by mouth daily.   Cholecalciferol 50 MCG (2000 UT) Caps Take by mouth.   ciprofloxacin 500 MG tablet Commonly known as: CIPRO Take 1 tablet (500 mg total) by mouth every 12 (twelve) hours. Started by: Zara Council, PA-C   glipiZIDE 5 MG tablet Commonly known as: GLUCOTROL TAKE 1 TABLET(5 MG) BY MOUTH TWICE DAILY BEFORE A MEAL   hydrochlorothiazide 25 MG tablet Commonly known as: HYDRODIURIL Take 1 tablet (25 mg total) by mouth daily.   losartan 100 MG tablet Commonly known as: COZAAR TAKE 1 TABLET(100 MG) BY MOUTH DAILY   meloxicam 15 MG tablet Commonly known as: MOBIC TAKE 1 TABLET(15 MG) BY MOUTH DAILY   metFORMIN 1000 MG tablet Commonly known as: GLUCOPHAGE TAKE 1 TABLET(1000 MG) BY MOUTH TWICE DAILY WITH A MEAL   metoprolol tartrate 25 MG tablet Commonly known as: LOPRESSOR TAKE 1 TABLET(25 MG) BY MOUTH TWICE DAILY    tamsulosin 0.4 MG Caps capsule Commonly known as: FLOMAX Take 2 capsules (0.8 mg total) by mouth daily.   traMADol 50 MG tablet Commonly known as: ULTRAM Take 1 tablet (50 mg total) by mouth every 6 (six) hours as needed. for pain   vitamin C 100 MG tablet Take 100 mg by mouth daily.        Allergies: No Known Allergies  Family History: Family History  Adopted: Yes  Problem Relation Age of Onset   Diabetes Mother    Hyperlipidemia Father    Hypertension Father     Social History:  reports that he quit smoking about 25 years ago. His smoking use included cigarettes. He has a 21.00 pack-year smoking history. He has quit using smokeless tobacco. He reports current alcohol use of about 14.0 standard drinks per week. He reports that he does not use drugs.  ROS: Pertinent ROS in HPI  Physical Exam: BP (!) 152/66   Pulse 92   Ht 6' (1.829 m)   Wt (!) 307 lb (139.3 kg)   BMI 41.64 kg/m   Constitutional:  Well nourished. Alert and oriented, No acute distress. HEENT: [place] AT, mask in place.  Trachea midline Cardiovascular: No clubbing, cyanosis, or edema. Respiratory: Normal respiratory effort, no increased work of breathing. GU: No CVA tenderness.  No bladder fullness or masses.  Patient with uncircumcised phallus.  Foreskin easily retracted  Urethral meatus is patent.  No penile discharge. No penile lesions or rashes. Scrotum without lesions, cysts, rashes and/or edema.  Testicles are located scrotally bilaterally. No masses are appreciated in the testicles. Left and right epididymis are normal.  Small left hydrocele.   Neurologic: Grossly intact, no focal deficits, moving all 4 extremities. Psychiatric: Normal mood and affect.   Laboratory Data: Urinalysis Component     Latest Ref Rng & Units 09/15/2020  Specific Gravity, UA     1.005 - 1.030 >1.030 (H)  pH, UA     5.0 - 7.5 5.5  Color, UA     Yellow Yellow  Appearance Ur     Clear Cloudy (A)  Leukocytes,UA      Negative 2+ (A)  Protein,UA     Negative/Trace Trace (A)  Glucose, UA     Negative 2+ (A)  Ketones, UA     Negative Trace (A)  RBC, UA     Negative Trace (A)  Bilirubin, UA     Negative  Negative  Urobilinogen, Ur     0.2 - 1.0 mg/dL 0.2  Nitrite, UA     Negative Positive (A)  Microscopic Examination      See below:   Component     Latest Ref Rng & Units 09/15/2020  WBC, UA     0 - 5 /hpf >30 (A)  RBC     0 - 2 /hpf 0-2  Epithelial Cells (non renal)     0 - 10 /hpf 0-10  Casts     None seen /lpf Present (A)  Cast Type     N/A Hyaline casts  Bacteria, UA     None seen/Few Many (A)   I have reviewed the labs.   Pertinent Imaging: N/A  Assessment & Plan:    1. Recurrent UTI's  -UA grossly infected -Urine sent for culture -started Cipro 500 mg, twice daily for seven days -will adjust if necessary once culture and sensitivities return -schedule RUS to rule out nidus for rUTI's -schedule cysto to rule out nidus for rUTI's -he is likely an uncontrolled diabetic, so this may also be contributing to his rUTI's as well  2. Microscopic hematuria -resolved  3. BPH with LUTS -PSA stable -DRE benign -UA benign -PVR < 300 cc -symptoms -urinary hesitancy, has to sit down to feel like he has completely emptying his bladder -cysto pending -I have explained to the patient that they will  be scheduled for a cystoscopy in our office to evaluate their bladder.  The cystoscopy consists of passing a tube with a lens up through their urethra and into their urinary bladder.   We will inject the urethra with a lidocaine gel prior to introducing the cystoscope to help with any discomfort during the procedure.   After the procedure, they might experience blood in the urine and discomfort with urination.  This will abate after the first few voids.  I have  encouraged the patient to increase water intake  during this time.  Patient denies any allergies to lidocaine.    4. Left  hydrocele -Explained to the patient that a hydrocele is a collection of peritoneal fluid between the parietal and visceral layers of the tunica vaginalis, which directly surrounds the testis and spermatic cord.  It is the same layer that forms the peritoneal lining of the abdomen.  Hydroceles are believed to arise from an imbalance of secretion and reabsorption of fluid from the tunica vaginalis and/or inflammation -scrotal ultrasound as he is anxious concerning his hydrocele as his cousin had testicular cancer   Return for RUS/scrotal/cysto for rUTI's with Dr. Diamantina Providence .  These notes generated with voice recognition software. I apologize for typographical errors.  Zara Council, PA-C  [facility] [address]  [place], [place] [address] [phone]

## 2020-09-15 ENCOUNTER — Encounter: Payer: Self-pay | Admitting: Urology

## 2020-09-15 ENCOUNTER — Other Ambulatory Visit: Payer: Self-pay

## 2020-09-15 ENCOUNTER — Ambulatory Visit: Payer: BC Managed Care – PPO | Admitting: Urology

## 2020-09-15 VITALS — BP 152/66 | HR 92 | Ht 72.0 in | Wt 307.0 lb

## 2020-09-15 DIAGNOSIS — R300 Dysuria: Secondary | ICD-10-CM

## 2020-09-15 DIAGNOSIS — N433 Hydrocele, unspecified: Secondary | ICD-10-CM

## 2020-09-15 DIAGNOSIS — N390 Urinary tract infection, site not specified: Secondary | ICD-10-CM | POA: Diagnosis not present

## 2020-09-15 DIAGNOSIS — R3129 Other microscopic hematuria: Secondary | ICD-10-CM | POA: Diagnosis not present

## 2020-09-15 DIAGNOSIS — N401 Enlarged prostate with lower urinary tract symptoms: Secondary | ICD-10-CM

## 2020-09-15 DIAGNOSIS — R350 Frequency of micturition: Secondary | ICD-10-CM

## 2020-09-15 LAB — URINALYSIS, COMPLETE
Bilirubin, UA: NEGATIVE
Nitrite, UA: POSITIVE — AB
Specific Gravity, UA: >1.03 — ABNORMAL HIGH (ref 1.005–1.030)
Urobilinogen, Ur: 0.2 mg/dL (ref 0.2–1.0)
pH, UA: 5.5 (ref 5.0–7.5)

## 2020-09-15 LAB — MICROSCOPIC EXAMINATION: WBC, UA: >30 /hpf — AB (ref 0–5)

## 2020-09-15 MED ORDER — CIPROFLOXACIN HCL 500 MG PO TABS: 500.0000 mg | ORAL_TABLET | Freq: Two times a day (BID) | ORAL | 0 refills | Status: DC

## 2020-09-24 DIAGNOSIS — C44519 Basal cell carcinoma of skin of other part of trunk: Secondary | ICD-10-CM | POA: Diagnosis not present

## 2020-09-24 LAB — CULTURE, URINE COMPREHENSIVE

## 2020-10-12 DIAGNOSIS — Z23 Encounter for immunization: Secondary | ICD-10-CM | POA: Diagnosis not present

## 2020-10-12 DIAGNOSIS — E349 Endocrine disorder, unspecified: Secondary | ICD-10-CM | POA: Diagnosis not present

## 2020-10-12 DIAGNOSIS — I10 Essential (primary) hypertension: Secondary | ICD-10-CM | POA: Diagnosis not present

## 2020-10-12 DIAGNOSIS — E1140 Type 2 diabetes mellitus with diabetic neuropathy, unspecified: Secondary | ICD-10-CM | POA: Diagnosis not present

## 2020-10-12 DIAGNOSIS — E785 Hyperlipidemia, unspecified: Secondary | ICD-10-CM | POA: Diagnosis not present

## 2020-10-13 ENCOUNTER — Other Ambulatory Visit: Payer: Self-pay

## 2020-10-13 ENCOUNTER — Ambulatory Visit
Admission: RE | Admit: 2020-10-13 | Discharge: 2020-10-13 | Disposition: A | Discharge status: Home / Self Care | Payer: BC Managed Care – PPO | Source: Ambulatory Visit | Attending: Urology | Admitting: Urology

## 2020-10-13 DIAGNOSIS — N433 Hydrocele, unspecified: Secondary | ICD-10-CM | POA: Diagnosis not present

## 2020-10-13 DIAGNOSIS — N390 Urinary tract infection, site not specified: Secondary | ICD-10-CM | POA: Insufficient documentation

## 2020-10-14 ENCOUNTER — Ambulatory Visit: Payer: BC Managed Care – PPO | Admitting: Urology

## 2020-10-14 ENCOUNTER — Encounter: Payer: Self-pay | Admitting: Urology

## 2020-10-14 VITALS — BP 159/85 | HR 84 | Ht 73.0 in | Wt 311.0 lb

## 2020-10-14 DIAGNOSIS — Z125 Encounter for screening for malignant neoplasm of prostate: Secondary | ICD-10-CM

## 2020-10-14 DIAGNOSIS — R350 Frequency of micturition: Secondary | ICD-10-CM

## 2020-10-14 DIAGNOSIS — N401 Enlarged prostate with lower urinary tract symptoms: Secondary | ICD-10-CM | POA: Diagnosis not present

## 2020-10-14 DIAGNOSIS — N390 Urinary tract infection, site not specified: Secondary | ICD-10-CM | POA: Diagnosis not present

## 2020-10-14 MED ORDER — LIDOCAINE HCL URETHRAL/MUCOSAL 2 % EX GEL: 1.0000 "application " | Freq: Once | CUTANEOUS | Status: AC

## 2020-10-14 NOTE — Progress Notes (Signed)
   10/14/2020 12:59 PM   David Pineda. 01/08/1956 [id number]  Reason for visit: Follow up BPH, recurrent UTIs  HPI: 65 year old male with comorbidities including morbid obesity and BMI of 41, diabetes recent hemoglobin A1c 7.5, and sleep apnea compliant with CPAP.  He originally had a UTI after a knee surgery a few years ago, and had been doing well on Flomax with normal PVRs.  PSA was normal at 1.2.  He had UTI symptoms in July 2022 and saw our PA David Pineda was diagnosed with an E. coli UTI and treated with a week of Bactrim.  He had recurrent symptoms in September and culture again showed E. coli and he was treated with 7 days of Cipro.  He denies any urinary symptoms at this time, and PVR is normal today.  David Pineda ordered a renal and scrotal ultrasound, which were both completely benign.  I personally viewed and interpreted those images.  He was scheduled for a cystoscopy today for further evaluation of his recurrent UTIs.  He is averse to undergoing cystoscopy today unless absolutely necessary.  We discussed the benefits of cystoscopy in terms of identifying any urethral stricture disease, BPH or prostate abnormalities, or bladder abnormalities not imaged by bladder ultrasound.  As he is feeling better, he would like to hold off on cystoscopy at that this time, however he understands that if he has another infection I would again strongly recommend cystoscopy for further evaluation.  Continue Flomax and cranberry tablets RTC 1 year IPSS/PVR If recurrent infection, would again recommend cystoscopy   [name], [place] [address], [place], [place] [address] [phone]

## 2020-11-16 DIAGNOSIS — G4733 Obstructive sleep apnea (adult) (pediatric): Secondary | ICD-10-CM | POA: Diagnosis not present

## 2020-11-18 DIAGNOSIS — G4733 Obstructive sleep apnea (adult) (pediatric): Secondary | ICD-10-CM | POA: Diagnosis not present

## 2020-12-16 DIAGNOSIS — G4733 Obstructive sleep apnea (adult) (pediatric): Secondary | ICD-10-CM | POA: Diagnosis not present

## 2021-01-13 ENCOUNTER — Other Ambulatory Visit: Payer: Self-pay | Admitting: Internal Medicine

## 2021-01-16 DIAGNOSIS — G4733 Obstructive sleep apnea (adult) (pediatric): Secondary | ICD-10-CM | POA: Diagnosis not present

## 2021-02-16 DIAGNOSIS — G4733 Obstructive sleep apnea (adult) (pediatric): Secondary | ICD-10-CM | POA: Diagnosis not present

## 2021-03-10 DIAGNOSIS — E1140 Type 2 diabetes mellitus with diabetic neuropathy, unspecified: Secondary | ICD-10-CM | POA: Diagnosis not present

## 2021-03-10 DIAGNOSIS — E785 Hyperlipidemia, unspecified: Secondary | ICD-10-CM | POA: Diagnosis not present

## 2021-03-10 DIAGNOSIS — Z6841 Body Mass Index (BMI) 40.0 and over, adult: Secondary | ICD-10-CM | POA: Diagnosis not present

## 2021-03-10 DIAGNOSIS — Z0000 Encounter for general adult medical examination without abnormal findings: Secondary | ICD-10-CM | POA: Diagnosis not present

## 2021-03-10 DIAGNOSIS — I10 Essential (primary) hypertension: Secondary | ICD-10-CM | POA: Diagnosis not present

## 2021-03-10 DIAGNOSIS — Z1389 Encounter for screening for other disorder: Secondary | ICD-10-CM | POA: Diagnosis not present

## 2021-03-16 DIAGNOSIS — G4733 Obstructive sleep apnea (adult) (pediatric): Secondary | ICD-10-CM | POA: Diagnosis not present

## 2021-03-23 DIAGNOSIS — H2513 Age-related nuclear cataract, bilateral: Secondary | ICD-10-CM | POA: Diagnosis not present

## 2021-03-23 DIAGNOSIS — E119 Type 2 diabetes mellitus without complications: Secondary | ICD-10-CM | POA: Diagnosis not present

## 2021-03-23 DIAGNOSIS — H53462 Homonymous bilateral field defects, left side: Secondary | ICD-10-CM | POA: Diagnosis not present

## 2021-03-23 DIAGNOSIS — H53461 Homonymous bilateral field defects, right side: Secondary | ICD-10-CM | POA: Diagnosis not present

## 2021-03-23 DIAGNOSIS — H40013 Open angle with borderline findings, low risk, bilateral: Secondary | ICD-10-CM | POA: Diagnosis not present

## 2021-03-23 DIAGNOSIS — Z7984 Long term (current) use of oral hypoglycemic drugs: Secondary | ICD-10-CM | POA: Diagnosis not present

## 2021-03-24 DIAGNOSIS — D225 Melanocytic nevi of trunk: Secondary | ICD-10-CM | POA: Diagnosis not present

## 2021-03-24 DIAGNOSIS — D2262 Melanocytic nevi of left upper limb, including shoulder: Secondary | ICD-10-CM | POA: Diagnosis not present

## 2021-03-24 DIAGNOSIS — L309 Dermatitis, unspecified: Secondary | ICD-10-CM | POA: Diagnosis not present

## 2021-03-24 DIAGNOSIS — Z85828 Personal history of other malignant neoplasm of skin: Secondary | ICD-10-CM | POA: Diagnosis not present

## 2021-04-16 DIAGNOSIS — G4733 Obstructive sleep apnea (adult) (pediatric): Secondary | ICD-10-CM | POA: Diagnosis not present

## 2021-05-17 DIAGNOSIS — G4733 Obstructive sleep apnea (adult) (pediatric): Secondary | ICD-10-CM | POA: Diagnosis not present

## 2021-05-20 ENCOUNTER — Ambulatory Visit: Payer: Self-pay | Admitting: Urology

## 2021-06-17 DIAGNOSIS — G4733 Obstructive sleep apnea (adult) (pediatric): Secondary | ICD-10-CM | POA: Diagnosis not present

## 2021-07-17 DIAGNOSIS — G4733 Obstructive sleep apnea (adult) (pediatric): Secondary | ICD-10-CM | POA: Diagnosis not present

## 2021-08-16 DIAGNOSIS — G4733 Obstructive sleep apnea (adult) (pediatric): Secondary | ICD-10-CM | POA: Diagnosis not present

## 2021-09-09 DIAGNOSIS — I10 Essential (primary) hypertension: Secondary | ICD-10-CM | POA: Diagnosis not present

## 2021-09-09 DIAGNOSIS — Z6841 Body Mass Index (BMI) 40.0 and over, adult: Secondary | ICD-10-CM | POA: Diagnosis not present

## 2021-09-09 DIAGNOSIS — E1140 Type 2 diabetes mellitus with diabetic neuropathy, unspecified: Secondary | ICD-10-CM | POA: Diagnosis not present

## 2021-09-09 DIAGNOSIS — E785 Hyperlipidemia, unspecified: Secondary | ICD-10-CM | POA: Diagnosis not present

## 2021-09-15 DIAGNOSIS — E349 Endocrine disorder, unspecified: Secondary | ICD-10-CM | POA: Diagnosis not present

## 2021-09-15 DIAGNOSIS — E1140 Type 2 diabetes mellitus with diabetic neuropathy, unspecified: Secondary | ICD-10-CM | POA: Diagnosis not present

## 2021-09-15 DIAGNOSIS — E785 Hyperlipidemia, unspecified: Secondary | ICD-10-CM | POA: Diagnosis not present

## 2021-09-15 DIAGNOSIS — I10 Essential (primary) hypertension: Secondary | ICD-10-CM | POA: Diagnosis not present

## 2021-09-16 DIAGNOSIS — G4733 Obstructive sleep apnea (adult) (pediatric): Secondary | ICD-10-CM | POA: Diagnosis not present

## 2021-09-18 ENCOUNTER — Other Ambulatory Visit: Payer: Self-pay | Admitting: Urology

## 2021-09-18 DIAGNOSIS — N401 Enlarged prostate with lower urinary tract symptoms: Secondary | ICD-10-CM

## 2021-09-20 DIAGNOSIS — H2513 Age-related nuclear cataract, bilateral: Secondary | ICD-10-CM | POA: Diagnosis not present

## 2021-09-20 DIAGNOSIS — E119 Type 2 diabetes mellitus without complications: Secondary | ICD-10-CM | POA: Diagnosis not present

## 2021-09-20 DIAGNOSIS — H40023 Open angle with borderline findings, high risk, bilateral: Secondary | ICD-10-CM | POA: Diagnosis not present

## 2021-09-20 DIAGNOSIS — Z7984 Long term (current) use of oral hypoglycemic drugs: Secondary | ICD-10-CM | POA: Diagnosis not present

## 2021-10-14 ENCOUNTER — Other Ambulatory Visit: Payer: BC Managed Care – PPO

## 2021-10-14 DIAGNOSIS — Z23 Encounter for immunization: Secondary | ICD-10-CM | POA: Diagnosis not present

## 2021-10-16 DIAGNOSIS — G4733 Obstructive sleep apnea (adult) (pediatric): Secondary | ICD-10-CM | POA: Diagnosis not present

## 2021-10-20 ENCOUNTER — Ambulatory Visit: Payer: BC Managed Care – PPO | Admitting: Urology

## 2021-11-14 DIAGNOSIS — G4733 Obstructive sleep apnea (adult) (pediatric): Secondary | ICD-10-CM | POA: Diagnosis not present

## 2021-11-16 DIAGNOSIS — G4733 Obstructive sleep apnea (adult) (pediatric): Secondary | ICD-10-CM | POA: Diagnosis not present

## 2021-11-17 ENCOUNTER — Other Ambulatory Visit: Payer: BC Managed Care – PPO

## 2021-11-17 ENCOUNTER — Other Ambulatory Visit: Payer: Self-pay | Admitting: Family Medicine

## 2021-11-17 DIAGNOSIS — N401 Enlarged prostate with lower urinary tract symptoms: Secondary | ICD-10-CM | POA: Diagnosis not present

## 2021-11-17 DIAGNOSIS — R350 Frequency of micturition: Secondary | ICD-10-CM | POA: Diagnosis not present

## 2021-11-18 LAB — PSA: Prostate Specific Ag, Serum: 1.2 ng/mL (ref 0.0–4.0)

## 2021-11-24 ENCOUNTER — Telehealth: Payer: Self-pay | Admitting: Family Medicine

## 2021-11-24 NOTE — Telephone Encounter (Signed)
error 

## 2021-11-25 ENCOUNTER — Encounter: Payer: Self-pay | Admitting: Urology

## 2021-11-25 ENCOUNTER — Ambulatory Visit (INDEPENDENT_AMBULATORY_CARE_PROVIDER_SITE_OTHER): Payer: BC Managed Care – PPO | Admitting: Urology

## 2021-11-25 VITALS — BP 163/78 | HR 90 | Ht 72.99 in | Wt 308.0 lb

## 2021-11-25 DIAGNOSIS — Z125 Encounter for screening for malignant neoplasm of prostate: Secondary | ICD-10-CM | POA: Diagnosis not present

## 2021-11-25 DIAGNOSIS — R350 Frequency of micturition: Secondary | ICD-10-CM | POA: Diagnosis not present

## 2021-11-25 DIAGNOSIS — N390 Urinary tract infection, site not specified: Secondary | ICD-10-CM

## 2021-11-25 DIAGNOSIS — N401 Enlarged prostate with lower urinary tract symptoms: Secondary | ICD-10-CM | POA: Diagnosis not present

## 2021-11-25 DIAGNOSIS — N3281 Overactive bladder: Secondary | ICD-10-CM

## 2021-11-25 DIAGNOSIS — Z87440 Personal history of urinary (tract) infections: Secondary | ICD-10-CM | POA: Diagnosis not present

## 2021-11-25 NOTE — Progress Notes (Signed)
   11/25/2021 10:30 AM   [name]. 24-Sep-1955 [id number]  Reason for visit: Follow up BPH, recurrent UTIs, PSA screening  HPI: 66 year old male with comorbidities including morbid obesity and BMI of 41, diabetes recent hemoglobin A1c 8.6, and sleep apnea compliant with CPAP.  He originally had a UTI after a knee surgery a few years ago, and had been doing well on Flomax with normal PVRs.  PSA was normal at 1.2, and remains normal at most recent PSA 11/17/2021 at 1.2.  He had UTI symptoms in July 2022 and saw our PA Zara Council was diagnosed with an E. coli UTI and treated with a week of Bactrim.  He had recurrent symptoms in September and culture again showed E. coli and he was treated with 7 days of Cipro.  Coralie Common ordered a renal and scrotal ultrasound, which were both completely benign.  She recommended a cystoscopy, but he refused.  It sounds like he had an episode of a traumatic Foley placement as a kid, and he reports "he will get violent" if he has a cystoscopy.  We discussed the risk of missing additional pathology like urethral stricture, but he has done well over the last year and I think it is reasonable to continue to defer cystoscopy at this time.  No UTIs over the last year.  Primary urinary issue is some frequency during the day and 2 times overnight.  PVR today normal at 7m.  Likely more behavioral related with diabetes and morbid obesity.  We discussed's including discontinuing the Flomax, and he will consider trialing off this in the future.  Okay to trial off Flomax, but can resume if worsened urinary symptoms Continue cranberry tablets Follow-up with urology as needed If recurrent infections, would discuss CT and/or cystoscopy, patient very clear he would not tolerate cystoscopy in clinic  [place] [place] [phone]

## 2021-12-08 DIAGNOSIS — D2262 Melanocytic nevi of left upper limb, including shoulder: Secondary | ICD-10-CM | POA: Diagnosis not present

## 2021-12-08 DIAGNOSIS — D0461 Carcinoma in situ of skin of right upper limb, including shoulder: Secondary | ICD-10-CM | POA: Diagnosis not present

## 2021-12-08 DIAGNOSIS — Z85828 Personal history of other malignant neoplasm of skin: Secondary | ICD-10-CM | POA: Diagnosis not present

## 2021-12-08 DIAGNOSIS — D2261 Melanocytic nevi of right upper limb, including shoulder: Secondary | ICD-10-CM | POA: Diagnosis not present

## 2021-12-08 DIAGNOSIS — X32XXXA Exposure to sunlight, initial encounter: Secondary | ICD-10-CM | POA: Diagnosis not present

## 2021-12-08 DIAGNOSIS — B078 Other viral warts: Secondary | ICD-10-CM | POA: Diagnosis not present

## 2021-12-08 DIAGNOSIS — D2272 Melanocytic nevi of left lower limb, including hip: Secondary | ICD-10-CM | POA: Diagnosis not present

## 2021-12-08 DIAGNOSIS — D485 Neoplasm of uncertain behavior of skin: Secondary | ICD-10-CM | POA: Diagnosis not present

## 2021-12-08 DIAGNOSIS — L570 Actinic keratosis: Secondary | ICD-10-CM | POA: Diagnosis not present

## 2021-12-22 DIAGNOSIS — D0461 Carcinoma in situ of skin of right upper limb, including shoulder: Secondary | ICD-10-CM | POA: Diagnosis not present

## 2022-05-09 ENCOUNTER — Encounter: Payer: Self-pay | Admitting: Internal Medicine

## 2022-05-12 ENCOUNTER — Other Ambulatory Visit: Payer: Self-pay | Admitting: Internal Medicine

## 2022-05-12 DIAGNOSIS — R079 Chest pain, unspecified: Secondary | ICD-10-CM

## 2022-06-09 ENCOUNTER — Telehealth (HOSPITAL_COMMUNITY): Payer: Self-pay | Admitting: *Deleted

## 2022-06-09 NOTE — Telephone Encounter (Signed)
Attempted to call patient regarding upcoming cardiac CT appointment. °Left message on voicemail with name and callback number ° °Rubin Dais RN Navigator Cardiac Imaging °[facility] °[phone] Office °[phone] Cell ° °

## 2022-06-10 ENCOUNTER — Telehealth (HOSPITAL_COMMUNITY): Payer: Self-pay | Admitting: Emergency Medicine

## 2022-06-10 DIAGNOSIS — R079 Chest pain, unspecified: Secondary | ICD-10-CM

## 2022-06-10 MED ORDER — METOPROLOL TARTRATE 100 MG PO TABS
100.0000 mg | ORAL_TABLET | Freq: Once | ORAL | 0 refills | Status: AC
Start: 2022-06-10 — End: 2022-06-10

## 2022-06-10 MED ORDER — IVABRADINE HCL 5 MG PO TABS
15.0000 mg | ORAL_TABLET | Freq: Once | ORAL | 0 refills | Status: AC
Start: 2022-06-10 — End: 2022-06-10

## 2022-06-13 ENCOUNTER — Ambulatory Visit
Admission: RE | Admit: 2022-06-13 | Discharge: 2022-06-13 | Disposition: A | Discharge status: Home / Self Care | Payer: BC Managed Care – PPO | Source: Ambulatory Visit | Attending: Internal Medicine | Admitting: Internal Medicine

## 2022-06-13 DIAGNOSIS — R079 Chest pain, unspecified: Secondary | ICD-10-CM | POA: Diagnosis present

## 2022-06-13 LAB — POCT I-STAT CREATININE: Creatinine, Ser: 0.9 mg/dL (ref 0.61–1.24)

## 2022-06-13 MED ORDER — IOHEXOL 350 MG/ML SOLN
100.0000 mL | Freq: Once | INTRAVENOUS | Status: AC | PRN
  Administered 2022-06-13: 100 mL via INTRAVENOUS

## 2022-06-13 MED ORDER — METOPROLOL TARTRATE 5 MG/5ML IV SOLN
5.0000 mg | Freq: Once | INTRAVENOUS | Status: AC
  Administered 2022-06-13: 5 mg via INTRAVENOUS
  Filled 2022-06-13: qty 5

## 2022-06-13 MED ORDER — METOPROLOL TARTRATE 5 MG/5ML IV SOLN
10.0000 mg | Freq: Once | INTRAVENOUS | Status: AC
  Administered 2022-06-13: 10 mg via INTRAVENOUS
  Filled 2022-06-13: qty 10

## 2022-06-13 MED ORDER — NITROGLYCERIN 0.4 MG SL SUBL
0.8000 mg | SUBLINGUAL_TABLET | Freq: Once | SUBLINGUAL | Status: AC
  Administered 2022-06-13: 0.8 mg via SUBLINGUAL
  Filled 2022-06-13: qty 25

## 2022-06-13 NOTE — Progress Notes (Addendum)
Patient tolerated procedure well. Ambulate w/o difficulty. Denies any lightheadedness or being dizzy. Pt denies any pain at this time.  Pt is encouraged to drink additional water throughout the day and reason explained to patient. Patient verbalized understanding and all questions answered. ABC intact. No further needs at this time. Discharge from procedure area w/o issues. 

## 2022-06-25 IMAGING — US US RENAL
1 series · 14 of 25 positions shown · non-contrast
Comparison: None.

CLINICAL DATA: recurrent UTI's

EXAM:
RENAL / URINARY TRACT ULTRASOUND COMPLETE

[Series 1: us renal · 14 of 31 slices shown]
[im 1/31]
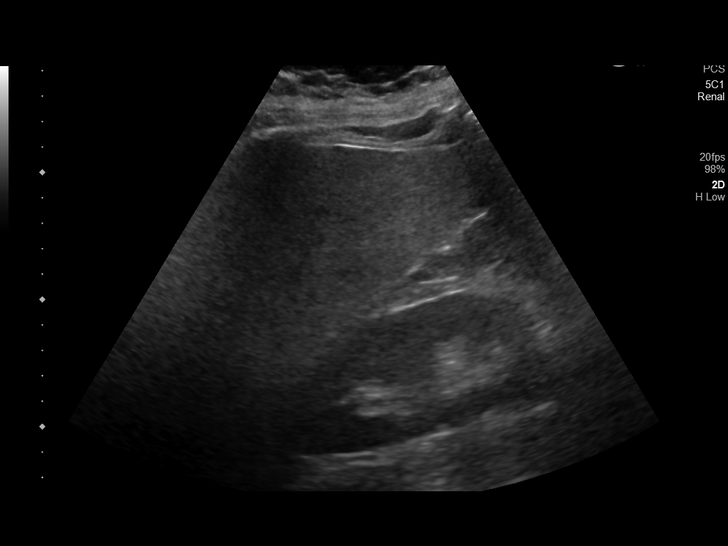
[im 3/31]
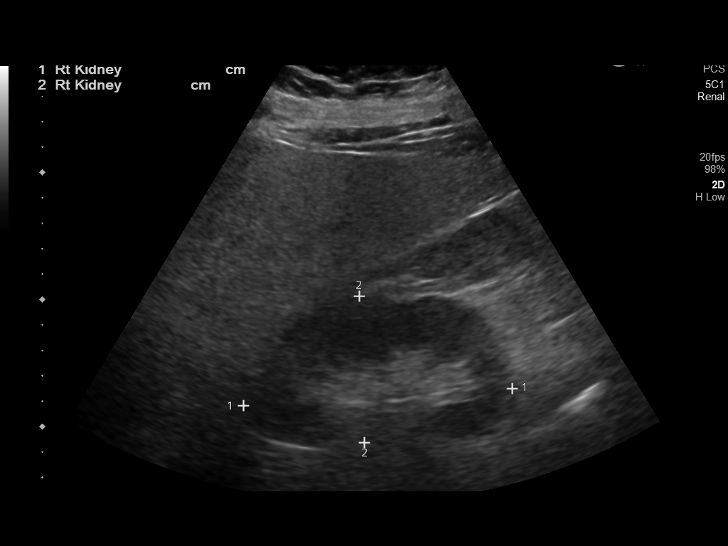
[im 6/31]
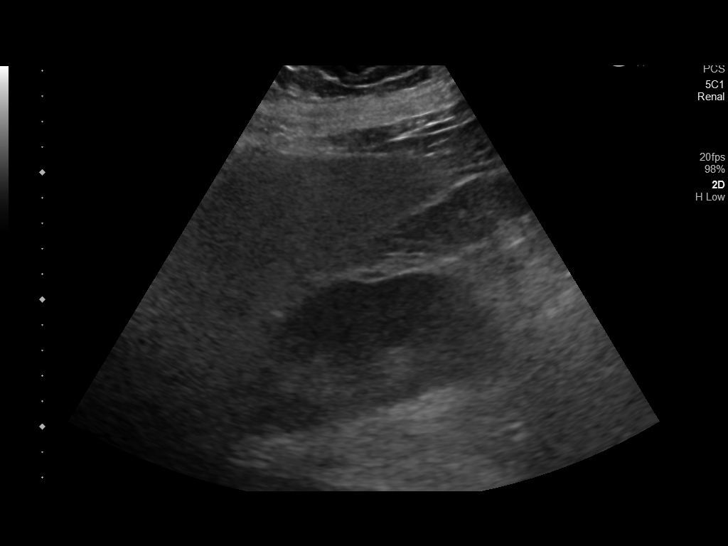
[im 8/31]
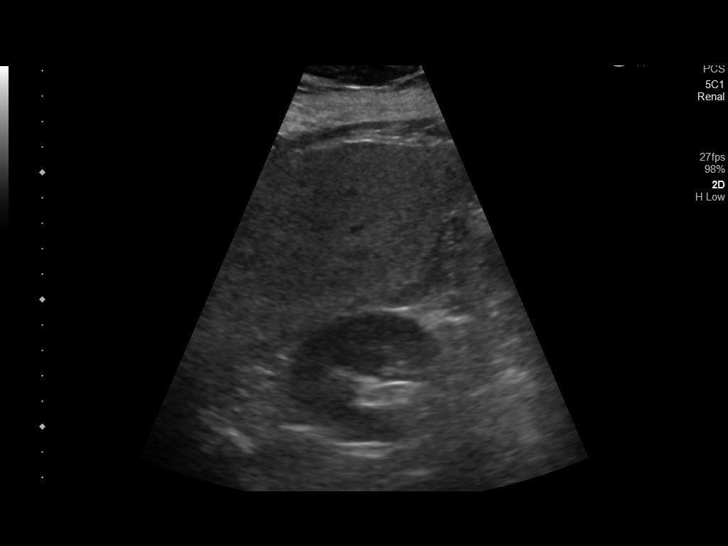
[im 11/31]
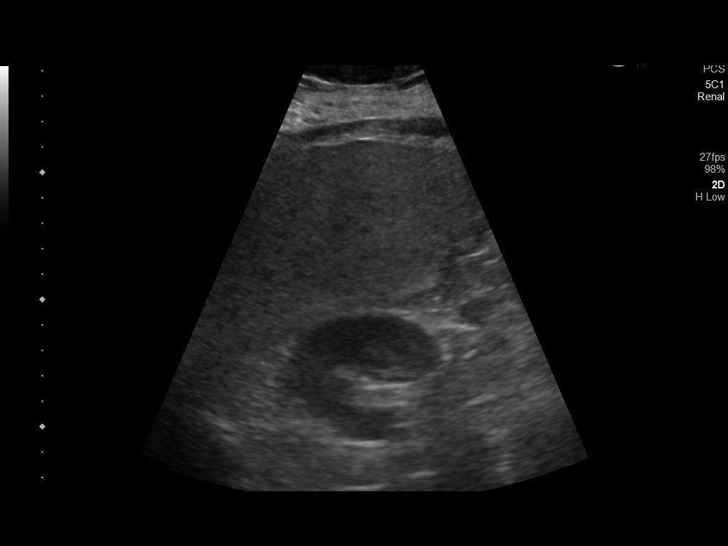
[im 12/31]
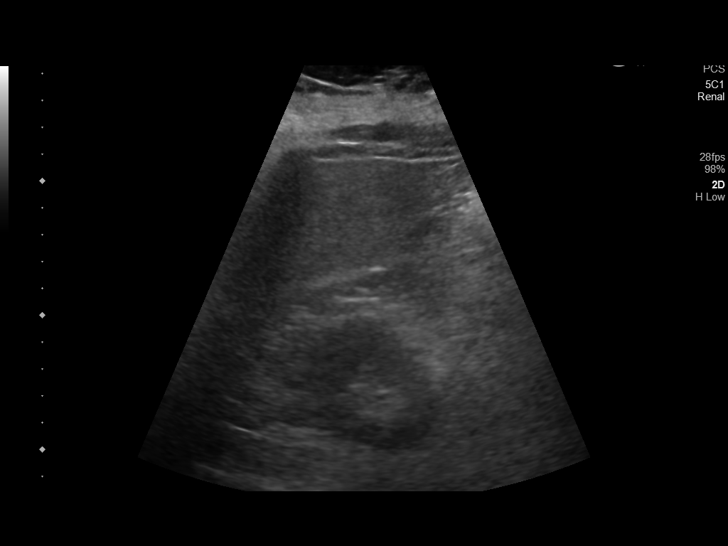
[im 14/31]
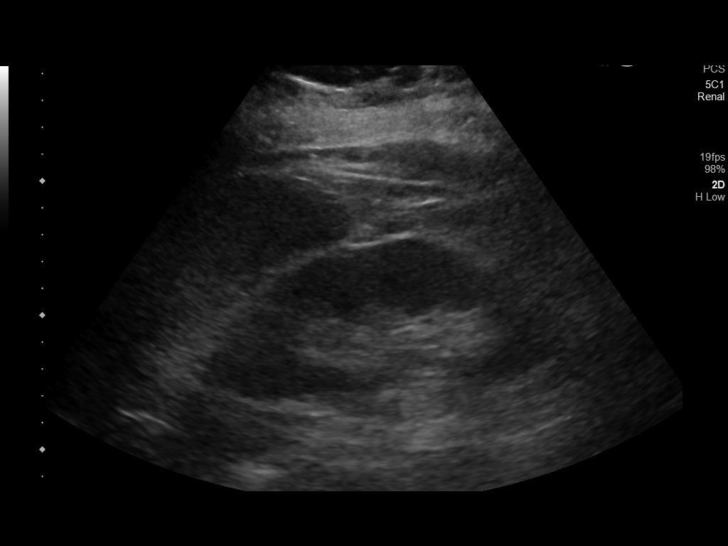
[im 17/31]
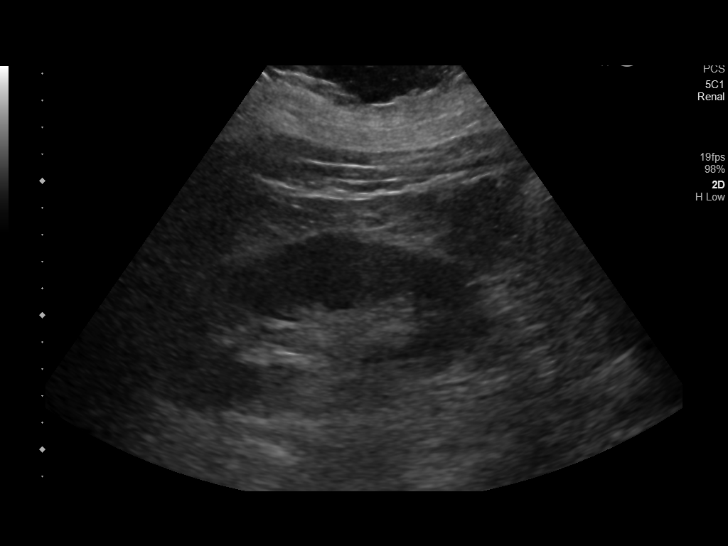
[im 19/31]
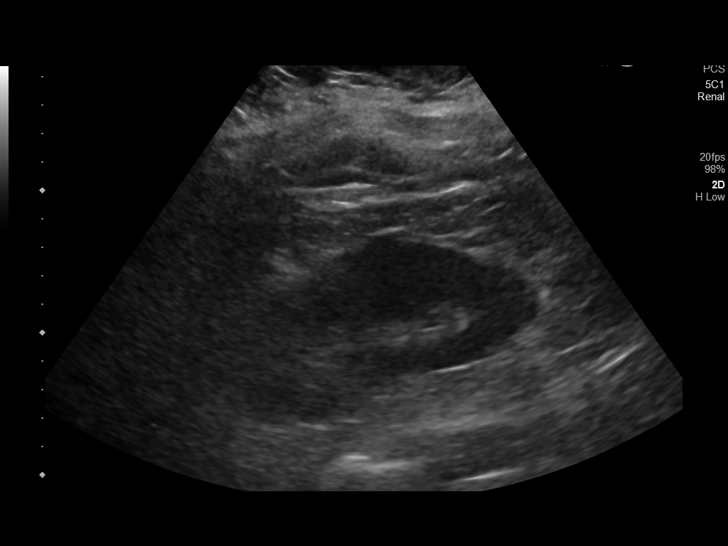
[im 21/31]
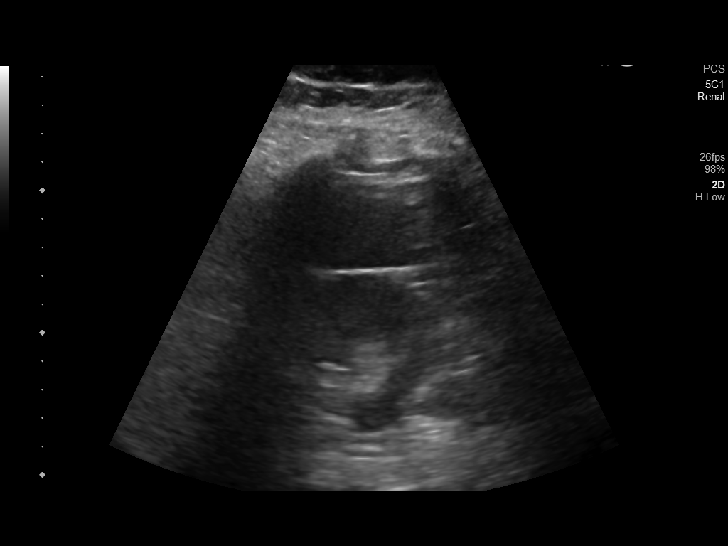
[im 23/31]
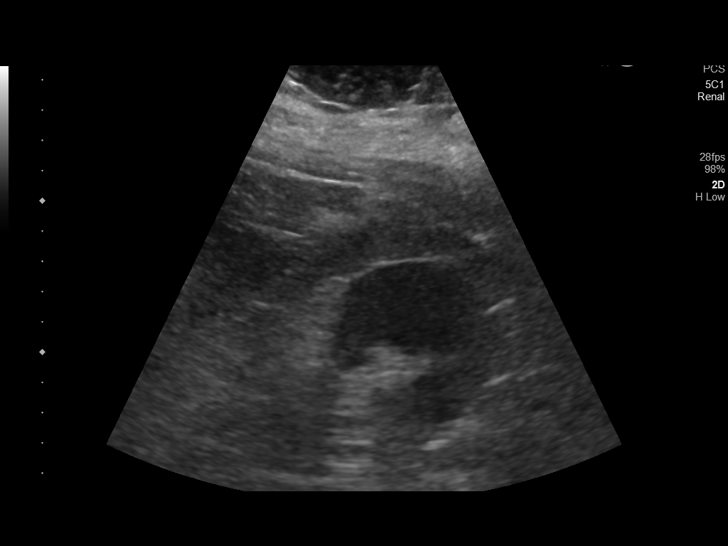
[im 26/31]
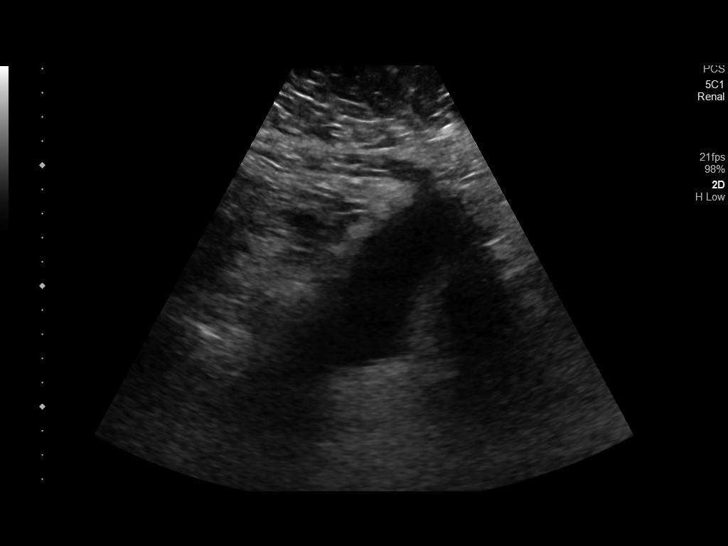
[im 28/31]
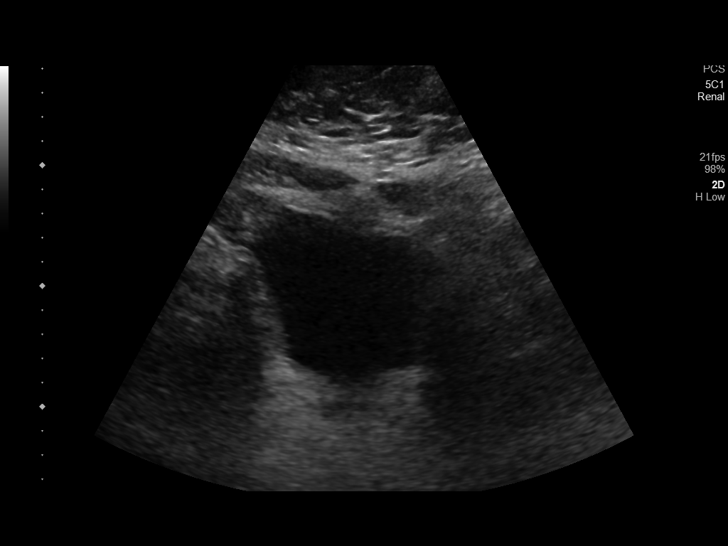
[im 31/31]
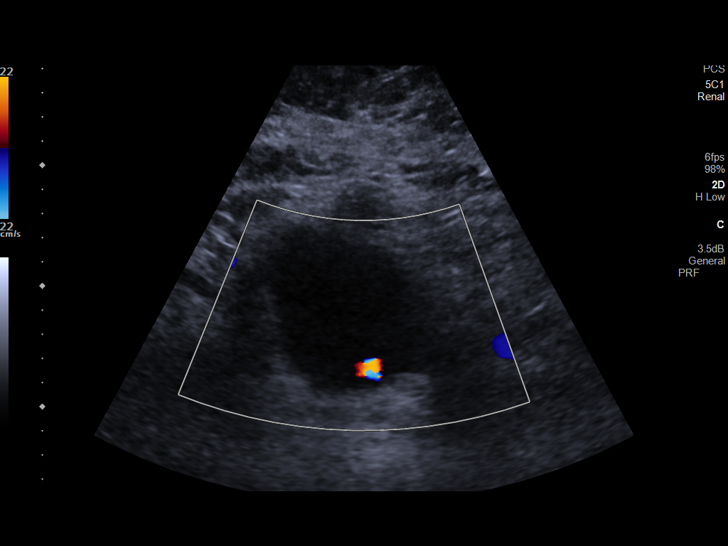

[14 of 25 positions shown; findings below may reference images not displayed]

FINDINGS: Right Kidney:

Renal measurements: 10.6 x 5.8 x 5.7 cm = volume: 182.37 mL.
Echogenicity within normal limits. No mass or hydronephrosis
visualized.

Left Kidney:

Renal measurements: 12.5 x 5.5 x 5.2 cm = volume: 187.02 mL.
Echogenicity within normal limits. No mass or hydronephrosis
visualized.

Bladder:

Appears normal for degree of bladder distention. Bilateral ureteral
jets are seen.

Other:

None.
IMPRESSION: Normal renal ultrasound.
